# Patient Record
Sex: Female | Born: 1950 | Race: White | Hispanic: No | Smoking: Never smoker
Health system: Southern US, Community
[De-identification: ages and names within clinical notes are randomized; demographics above are authoritative.]

## PROBLEM LIST (undated history)

## (undated) DIAGNOSIS — Z8719 Personal history of other diseases of the digestive system: Secondary | ICD-10-CM

## (undated) DIAGNOSIS — T7840XA Allergy, unspecified, initial encounter: Secondary | ICD-10-CM

## (undated) DIAGNOSIS — F329 Major depressive disorder, single episode, unspecified: Secondary | ICD-10-CM

## (undated) DIAGNOSIS — F419 Anxiety disorder, unspecified: Secondary | ICD-10-CM

## (undated) DIAGNOSIS — F32A Depression, unspecified: Secondary | ICD-10-CM

## (undated) DIAGNOSIS — L900 Lichen sclerosus et atrophicus: Secondary | ICD-10-CM

## (undated) DIAGNOSIS — S83249A Other tear of medial meniscus, current injury, unspecified knee, initial encounter: Secondary | ICD-10-CM

## (undated) DIAGNOSIS — R42 Dizziness and giddiness: Secondary | ICD-10-CM

## (undated) DIAGNOSIS — M6530 Trigger finger, unspecified finger: Secondary | ICD-10-CM

## (undated) DIAGNOSIS — H269 Unspecified cataract: Secondary | ICD-10-CM

## (undated) DIAGNOSIS — M8580 Other specified disorders of bone density and structure, unspecified site: Secondary | ICD-10-CM

## (undated) DIAGNOSIS — S82899A Other fracture of unspecified lower leg, initial encounter for closed fracture: Secondary | ICD-10-CM

## (undated) DIAGNOSIS — E785 Hyperlipidemia, unspecified: Secondary | ICD-10-CM

## (undated) HISTORY — DX: Personal history of other diseases of the digestive system: Z87.19

## (undated) HISTORY — DX: Depression, unspecified: F32.A

## (undated) HISTORY — PX: EYE SURGERY: SHX253

## (undated) HISTORY — DX: Lichen sclerosus et atrophicus: L90.0

## (undated) HISTORY — DX: Other specified disorders of bone density and structure, unspecified site: M85.80

## (undated) HISTORY — PX: ABDOMINAL HYSTERECTOMY: SHX81

## (undated) HISTORY — DX: Other fracture of unspecified lower leg, initial encounter for closed fracture: S82.899A

## (undated) HISTORY — DX: Hyperlipidemia, unspecified: E78.5

## (undated) HISTORY — DX: Allergy, unspecified, initial encounter: T78.40XA

## (undated) HISTORY — DX: Other tear of medial meniscus, current injury, unspecified knee, initial encounter: S83.249A

## (undated) HISTORY — DX: Dizziness and giddiness: R42

## (undated) HISTORY — DX: Trigger finger, unspecified finger: M65.30

## (undated) HISTORY — DX: Unspecified cataract: H26.9

## (undated) HISTORY — DX: Major depressive disorder, single episode, unspecified: F32.9

## (undated) HISTORY — DX: Anxiety disorder, unspecified: F41.9

---
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
## 1978-07-12 HISTORY — PX: PARTIAL HYSTERECTOMY: SHX80

## 1998-02-14 ENCOUNTER — Encounter: Admission: RE | Admit: 1998-02-14 | Discharge: 1998-02-14 | Payer: Self-pay | Admitting: *Deleted

## 1999-02-19 ENCOUNTER — Encounter (INDEPENDENT_AMBULATORY_CARE_PROVIDER_SITE_OTHER): Payer: Self-pay | Admitting: Internal Medicine

## 1999-02-19 ENCOUNTER — Other Ambulatory Visit: Admission: RE | Admit: 1999-02-19 | Discharge: 1999-02-19 | Payer: Self-pay | Admitting: Obstetrics and Gynecology

## 1999-04-01 ENCOUNTER — Encounter (INDEPENDENT_AMBULATORY_CARE_PROVIDER_SITE_OTHER): Payer: Self-pay | Admitting: Specialist

## 1999-04-01 ENCOUNTER — Other Ambulatory Visit: Admission: RE | Admit: 1999-04-01 | Discharge: 1999-04-01 | Payer: Self-pay | Admitting: Radiology

## 1999-08-13 DIAGNOSIS — IMO0002 Reserved for concepts with insufficient information to code with codable children: Secondary | ICD-10-CM | POA: Insufficient documentation

## 1999-09-10 ENCOUNTER — Encounter (INDEPENDENT_AMBULATORY_CARE_PROVIDER_SITE_OTHER): Payer: Self-pay | Admitting: Internal Medicine

## 2000-02-19 ENCOUNTER — Other Ambulatory Visit: Admission: RE | Admit: 2000-02-19 | Discharge: 2000-02-19 | Payer: Self-pay | Admitting: Obstetrics and Gynecology

## 2000-05-11 ENCOUNTER — Ambulatory Visit (HOSPITAL_BASED_OUTPATIENT_CLINIC_OR_DEPARTMENT_OTHER): Admission: RE | Admit: 2000-05-11 | Discharge: 2000-05-11 | Payer: Self-pay | Admitting: *Deleted

## 2001-03-28 ENCOUNTER — Other Ambulatory Visit: Admission: RE | Admit: 2001-03-28 | Discharge: 2001-03-28 | Payer: Self-pay | Admitting: Internal Medicine

## 2002-06-11 ENCOUNTER — Encounter (INDEPENDENT_AMBULATORY_CARE_PROVIDER_SITE_OTHER): Payer: Self-pay | Admitting: Internal Medicine

## 2003-09-10 HISTORY — PX: COLONOSCOPY: SHX174

## 2004-06-30 ENCOUNTER — Ambulatory Visit: Payer: Self-pay | Admitting: Gastroenterology

## 2005-02-09 ENCOUNTER — Ambulatory Visit: Payer: Self-pay | Admitting: Family Medicine

## 2005-03-26 ENCOUNTER — Ambulatory Visit: Payer: Self-pay | Admitting: Family Medicine

## 2005-05-13 ENCOUNTER — Ambulatory Visit: Payer: Self-pay | Admitting: Family Medicine

## 2005-05-26 ENCOUNTER — Ambulatory Visit: Payer: Self-pay | Admitting: Family Medicine

## 2006-04-26 ENCOUNTER — Ambulatory Visit: Payer: Self-pay | Admitting: Internal Medicine

## 2006-09-26 ENCOUNTER — Ambulatory Visit: Payer: Self-pay | Admitting: Family Medicine

## 2006-09-28 ENCOUNTER — Encounter (INDEPENDENT_AMBULATORY_CARE_PROVIDER_SITE_OTHER): Payer: Self-pay | Admitting: Internal Medicine

## 2006-09-28 DIAGNOSIS — K649 Unspecified hemorrhoids: Secondary | ICD-10-CM | POA: Insufficient documentation

## 2006-09-28 DIAGNOSIS — M6530 Trigger finger, unspecified finger: Secondary | ICD-10-CM | POA: Insufficient documentation

## 2006-11-14 ENCOUNTER — Ambulatory Visit: Payer: Self-pay | Admitting: Family Medicine

## 2006-11-14 DIAGNOSIS — R071 Chest pain on breathing: Secondary | ICD-10-CM | POA: Insufficient documentation

## 2006-11-16 ENCOUNTER — Telehealth (INDEPENDENT_AMBULATORY_CARE_PROVIDER_SITE_OTHER): Payer: Self-pay | Admitting: *Deleted

## 2007-04-30 ENCOUNTER — Encounter: Payer: Self-pay | Admitting: Family Medicine

## 2008-01-17 ENCOUNTER — Ambulatory Visit: Payer: Self-pay | Admitting: Family Medicine

## 2008-01-17 DIAGNOSIS — M8580 Other specified disorders of bone density and structure, unspecified site: Secondary | ICD-10-CM | POA: Insufficient documentation

## 2008-01-17 DIAGNOSIS — F419 Anxiety disorder, unspecified: Secondary | ICD-10-CM | POA: Insufficient documentation

## 2008-01-17 DIAGNOSIS — M810 Age-related osteoporosis without current pathological fracture: Secondary | ICD-10-CM | POA: Insufficient documentation

## 2008-01-17 DIAGNOSIS — R0789 Other chest pain: Secondary | ICD-10-CM | POA: Insufficient documentation

## 2008-02-02 ENCOUNTER — Ambulatory Visit: Payer: Self-pay | Admitting: Cardiology

## 2008-02-02 ENCOUNTER — Ambulatory Visit: Payer: Self-pay

## 2008-02-06 ENCOUNTER — Encounter: Payer: Self-pay | Admitting: Family Medicine

## 2008-03-25 ENCOUNTER — Ambulatory Visit: Payer: Self-pay | Admitting: Family Medicine

## 2008-03-25 DIAGNOSIS — K5900 Constipation, unspecified: Secondary | ICD-10-CM | POA: Insufficient documentation

## 2008-03-25 DIAGNOSIS — K602 Anal fissure, unspecified: Secondary | ICD-10-CM | POA: Insufficient documentation

## 2008-04-25 ENCOUNTER — Ambulatory Visit: Payer: Self-pay | Admitting: Family Medicine

## 2008-05-03 LAB — CONVERTED CEMR LAB
Cholesterol: 206 mg/dL (ref 0–200)
Direct LDL: 130.9 mg/dL
HDL: 43.9 mg/dL (ref >39.0)
Triglycerides: 109 mg/dL (ref 0–149)

## 2008-07-15 ENCOUNTER — Encounter (INDEPENDENT_AMBULATORY_CARE_PROVIDER_SITE_OTHER): Payer: Self-pay | Admitting: Internal Medicine

## 2008-09-09 ENCOUNTER — Encounter: Admission: RE | Admit: 2008-09-09 | Discharge: 2008-09-09 | Payer: Self-pay | Admitting: Internal Medicine

## 2009-02-22 ENCOUNTER — Emergency Department: Payer: Self-pay | Admitting: Emergency Medicine

## 2010-08-09 LAB — CONVERTED CEMR LAB
Albumin: 3.8 g/dL (ref 3.5–5.2)
Alkaline Phosphatase: 66 units/L (ref 39–117)
BUN: 12 mg/dL (ref 6–23)
GFR calc Af Amer: 111 mL/min
GFR calc non Af Amer: 92 mL/min
HDL: 53.3 mg/dL (ref >39.0)
Pap Smear: NORMAL
Potassium: 4.6 meq/L (ref 3.5–5.1)
Sodium: 140 meq/L (ref 135–145)
VLDL: 14 mg/dL (ref 0–40)

## 2010-11-24 NOTE — Procedures (Signed)
[facility]                              EXERCISE TREADMILL   NAME:Begley, [facility]                        MRN:          [id number]  DATE:02/02/2008                            DOB:          March 08, 1951    Yolanda Walsh is a 60 year old female who has been scheduled for an exercise  treadmill due to atypical chest pain.  She states she feels like she  cannot take a deep breath at times with a little bit of pressure.  This typically occurs at night and when she is sitting still.  With  exertion, it improves.  Because of the above,  Dr. Ermalene Searing scheduled her  for an exercise treadmill.   She exercised for duration of 5 minutes and 39 seconds on the Bruce  protocol.  Her heart rate at rest was 71 and increased to 150 and that  was 92% of her predicted maximum.  Her blood pressure at rest was 132/88  and increased to 159/40.  She did not have increased chest pain with the  study.  There were no diagnostic electrocardiographic changes.  It was  terminated secondary to dyspnea and fatigue.   FINAL INTERPRETATION:  Exercise treadmill with no new chest pain and no  electrocardiographic changes.  She was therefore interpreted as a  negative adequate exercise tolerance test.     Madolyn Frieze. Jens Som, MD, [facility]  Electronically Signed    BSC/MedQ  DD: 02/02/2008  DT: 02/03/2008  Job #: [id number]   cc:   Kerby Nora, MD

## 2010-11-27 NOTE — Op Note (Signed)
[place]  Patient:    Yolanda Walsh, Yolanda Walsh                     MRN: [id number] Proc. Date: 05/11/00 Adm. Date:  [id number] Attending:  Kendell Bane                           Operative Report  PREOPERATIVE DIAGNOSIS:  Trigger finger, right middle finger.  POSTOPERATIVE DIAGNOSIS:  Trigger finger, right middle finger.  PROCEDURE:  Release of A1 pulley, right middle finger.  SURGEON:  Lowell Bouton, M.D.  ANESTHESIA:  Marcaine 0.5% local with sedation.  OPERATIVE FINDINGS:  The patient had significant flexor tenosynovitis.  There was no nodule on the flexor tendon.  DESCRIPTION OF PROCEDURE:  Under 0.5% Marcaine local anesthesia with a tourniquet on the right forearm, the right hand was prepped and draped in the usual fashion.  After exsanguinating the limb the tourniquet was inflated to 225 mmHg.  A transverse incision was made in the palm in line with the middle finger on the right.  Blunt dissection was carried through the subcutaneous tissue down to the flexor sheath.  The A1 pulley was incised sharply and was released completely with the scissors.  A tendon was then flexed and extended, and there was found to be significant tenosynovitis around the tendon.  There was no further triggering.  The wound was irrigated with saline. The skin was closed with 4-0 nylon suture and sterile dressings were applied.  The tourniquet was released, with good circulation to the hand.  The patient went to the recovery room awake, stable and in good condition. DD:  05/11/00 TD:  05/11/00 Job: [id number]

## 2011-07-13 DIAGNOSIS — Z8719 Personal history of other diseases of the digestive system: Secondary | ICD-10-CM

## 2011-07-13 HISTORY — DX: Personal history of other diseases of the digestive system: Z87.19

## 2012-03-23 ENCOUNTER — Encounter: Payer: Self-pay | Admitting: Family Medicine

## 2012-03-23 ENCOUNTER — Ambulatory Visit (INDEPENDENT_AMBULATORY_CARE_PROVIDER_SITE_OTHER): Payer: BC Managed Care – PPO | Admitting: Family Medicine

## 2012-03-23 ENCOUNTER — Ambulatory Visit (INDEPENDENT_AMBULATORY_CARE_PROVIDER_SITE_OTHER)
Admission: RE | Admit: 2012-03-23 | Discharge: 2012-03-23 | Disposition: A | Discharge status: Home / Self Care | Payer: BC Managed Care – PPO | Source: Ambulatory Visit | Attending: Family Medicine | Admitting: Family Medicine

## 2012-03-23 VITALS — BP 140/90 | HR 78 | Temp 98.4°F | Wt 188.0 lb

## 2012-03-23 DIAGNOSIS — R1030 Lower abdominal pain, unspecified: Secondary | ICD-10-CM | POA: Insufficient documentation

## 2012-03-23 DIAGNOSIS — K5900 Constipation, unspecified: Secondary | ICD-10-CM

## 2012-03-23 DIAGNOSIS — R109 Unspecified abdominal pain: Secondary | ICD-10-CM

## 2012-03-23 DIAGNOSIS — Z0000 Encounter for general adult medical examination without abnormal findings: Secondary | ICD-10-CM

## 2012-03-23 LAB — CBC WITH DIFFERENTIAL/PLATELET
Eosinophils Absolute: 0 10*3/uL (ref 0.0–0.7)
HCT: 37.7 % (ref 36.0–46.0)
Lymphs Abs: 1.4 10*3/uL (ref 0.7–4.0)
MCHC: 32.2 g/dL (ref 30.0–36.0)
MCV: 86.2 fl (ref 78.0–100.0)
Monocytes Absolute: 0.5 10*3/uL (ref 0.1–1.0)
Neutrophils Relative %: 74.7 % (ref 43.0–77.0)
Platelets: 203 10*3/uL (ref 150.0–400.0)
RDW: 13.4 % (ref 11.5–14.6)

## 2012-03-23 MED ORDER — CYCLOBENZAPRINE HCL 10 MG PO TABS: 10.0000 mg | ORAL_TABLET | Freq: Two times a day (BID) | ORAL | Status: AC | PRN

## 2012-03-23 MED ORDER — NAPROXEN 500 MG PO TABS: ORAL_TABLET | ORAL | Status: DC

## 2012-03-23 NOTE — Progress Notes (Signed)
Subjective:    Patient ID: Yolanda Walsh, female    DOB: October 23, 1950, 61 y.o.   MRN: [id number]  HPI CC: L groin pain  Prior saw Dr. Ermalene Searing, not seen since 2009.  Saw Tomi Bamberger for a few visits in between.  A few weeks ago lidted tree limbs at home.  Bilateral groin/leg pain started 3 weeks ago.  Significant pain bilateral legs especially when flexing hips, feels tightness in groin and suprapubic soreness.  Positional pain.  This week had some nausea.  Last few days also having lower back pain.    No fevers/chills, vomiting, diarrhea.  No shooting pain down legs, no numbness/weakness of legs.  No bowel/bladder accidents.  Has gained weight.  No urinary sxs. Wt Readings from Last 3 Encounters:  03/23/12 188 lb (85.276 kg)  03/25/08 182 lb (82.555 kg)  01/17/08 181 lb 8 oz (82.328 kg)   H/o anal fissure, this week had bleeding from fissure flare.  Suffering from some constipation currently.  Lots of family issues recently.  Mother of Yolanda Walsh.  Daughter also with heart problems, lives far away.  Husband passed from non hodgkin's disease.  bp elevated today, no prior h/o HTN.  Wonders if due to pain. BP Readings from Last 3 Encounters:  03/23/12 140/90  03/25/08 120/70  01/17/08 130/76    Preventative: Last CPE 2009.  Due for this. Colonoscopy ~2005 according to pt normal.  Told didn't need f/u for 10 yrs.  Medications and allergies reviewed and updated in chart.  Past histories reviewed and updated if relevant as below. Patient Active Problem List  Diagnosis  . HEMORRHOIDS NOS, W/O COMPLICATIONS  . CONSTIPATION  . RECTAL FISSURE  . TRIGGER FINGER  . OSTEOPENIA  . CHEST WALL PAIN, ANTERIOR  . CHEST PAIN, ATYPICAL  . MEDIAL MENISCUS TEAR  . DEPRESSION, HX OF  . Groin pain   Past Medical History  Diagnosis Date  . Lichen sclerosus     temovate cream  . Medial meniscus tear   . Trigger finger   . History of hemorrhoids     w/o complications  . History of  rectal fissure    Past Surgical History  Procedure Date  . Colonoscopy 09/2003  . Partial hysterectomy 1980    for prolapse, ovaries remain  . Left hand surgery 1962    tendon laceration   History  Substance Use Topics  . Smoking status: Never Smoker   . Smokeless tobacco: Never Used  . Alcohol Use: Yes     rare   Family History  Problem Relation Age of Onset  . Coronary artery disease Brother 40    7 stents  . Cancer Mother     leukemia  . Coronary artery disease Mother   . Coronary artery disease Father   . Parkinsonism Brother   . Hypertension Brother   . Hypertension Sister   . Crohn's disease Sister   . Diabetes Maternal Grandfather    No Known Allergies No current outpatient prescriptions on file prior to visit.     Review of Systems Per HPI    Objective:   Physical Exam  Nursing note and vitals reviewed. Constitutional: She is oriented to person, place, and time. She appears well-developed and well-nourished. No distress.  HENT:  Head: Normocephalic and atraumatic.  Right Ear: External ear normal.  Left Ear: External ear normal.  Nose: Nose normal.  Mouth/Throat: Oropharynx is clear and moist. No oropharyngeal exudate.  Eyes: Conjunctivae normal and EOM are normal.  Pupils are equal, round, and reactive to light. No scleral icterus.  Neck: Normal range of motion. Neck supple.  Cardiovascular: Normal rate, regular rhythm, normal heart sounds and intact distal pulses.   No murmur heard. Pulses:      Radial pulses are 2+ on the right side, and 2+ on the left side.  Pulmonary/Chest: Effort normal and breath sounds normal. No respiratory distress. She has no wheezes. She has no rales.  Abdominal: Soft. Normal appearance and bowel sounds are normal. She exhibits no distension and no mass. There is no hepatosplenomegaly. There is tenderness (moderate) in the left lower quadrant. There is no rigidity, no rebound, no guarding, no CVA tenderness and negative  Murphy's sign. No hernia (but tender to palpation at site where hernia would be). Hernia confirmed negative in the right inguinal area and confirmed negative in the left inguinal area.  Musculoskeletal: Normal range of motion. She exhibits no edema.       Tender to palpation inner groin. Also tender bilateral hips with int/ext rotation at hip. Pain with flexion of hip.  Not pain with abduction of hips. Neg SLR but tender at hip flexors with SLR bilaterally  Lymphadenopathy:    She has no cervical adenopathy.  Neurological: She is alert and oriented to person, place, and time.       CN grossly intact, station and gait intact  Skin: Skin is warm and dry. No rash noted.  Psychiatric: She has a normal mood and affect. Her behavior is normal. Judgment and thought content normal.       Assessment & Plan:

## 2012-03-23 NOTE — Patient Instructions (Addendum)
I think this is coming from your hips.  Xray today. Treat with anti inflammatory - naprosyn twice daily with food for 5 days then as needed.  Don't take with advil or other anti inflammatory. Also provided muscle relaxant - flexeril - but caution may make you sleepy so no driving with this. Let us know if not improving or any worsening, would consider CT scan if worsening. Fasting blood work today. If any worsening pain, fevers >101 please seek urgent care. Return at your convenience in the next few weeks for physical - we will recheck hips then. Good to see you, call us with quesitons.

## 2012-03-24 LAB — LIPID PANEL
Total CHOL/HDL Ratio: 5
VLDL: 15.2 mg/dL (ref 0.0–40.0)

## 2012-03-24 LAB — COMPREHENSIVE METABOLIC PANEL
ALT: 16 U/L (ref 0–35)
AST: 21 U/L (ref 0–37)
Albumin: 3.9 g/dL (ref 3.5–5.2)
BUN: 15 mg/dL (ref 6–23)
Calcium: 9.2 mg/dL (ref 8.4–10.5)
Chloride: 103 mEq/L (ref 96–112)
Potassium: 4.4 mEq/L (ref 3.5–5.1)
Sodium: 137 mEq/L (ref 135–145)
Total Protein: 7.5 g/dL (ref 6.0–8.3)

## 2012-03-24 LAB — LDL CHOLESTEROL, DIRECT: Direct LDL: 170 mg/dL

## 2012-03-26 NOTE — Assessment & Plan Note (Addendum)
Exam more consistent with MSK issue, likely hip pathology - check bilat hip xray today to eval for arthritis, other.  Overall normal.   ?abd pathology (divertic or ovarian) given also endorsed LLQ pain, but very positional pain in general.  Check blood work today.  H/o partial hysterectomy. I suspect bilat groin/hip flexor strains.  Treat as MSK strain with trial of NSAID and flexeril - update if worsening, would probably consider abd/pelvic CT scan.  Pt agrees with plan.

## 2012-03-26 NOTE — Assessment & Plan Note (Signed)
Discussed importance of control of constipation for symptomatic rectal fissure.

## 2012-04-07 ENCOUNTER — Telehealth: Payer: Self-pay | Admitting: Family Medicine

## 2012-04-07 NOTE — Telephone Encounter (Signed)
Noted. Thanks.

## 2012-04-07 NOTE — Telephone Encounter (Signed)
Caller: [name]/Patient; Patient Name: Yolanda Walsh; PCP: Eustaquio Boyden [facility]); Best Callback Phone Number: [phone]; Reason for call: Cough/Congestion. Onset of symptoms yesterday 04/06/12.  +Body aches, Temperature 100.6(0), no n/v/d. Cough non productive, no sneezing,no runny nose,  +headache. Treatment at home Equate Non drowsy Claritin. She is drinking green tea and eating toast. Voice clear and purposeful. Emergent s/sx ruled out per Upper Respiratory Infection Protocol with exception to " new onset of two or more of the following symptoms: runny nose, sneezing , itchy or mild sore throat, mild headache or body anches, fatigue , low grade fever up to 101.5 orally.  Home care advise given to patient. Understanding expressed. She will closely mointor her s/sx and call for questions, changes or concerns.

## 2012-04-11 HISTORY — PX: OTHER SURGICAL HISTORY: SHX169

## 2012-04-13 ENCOUNTER — Other Ambulatory Visit: Payer: Self-pay | Admitting: Family Medicine

## 2012-04-17 ENCOUNTER — Other Ambulatory Visit: Payer: BC Managed Care – PPO

## 2012-04-18 ENCOUNTER — Ambulatory Visit (INDEPENDENT_AMBULATORY_CARE_PROVIDER_SITE_OTHER): Payer: BC Managed Care – PPO

## 2012-04-18 DIAGNOSIS — Z23 Encounter for immunization: Secondary | ICD-10-CM

## 2012-04-24 ENCOUNTER — Encounter: Payer: Self-pay | Admitting: Family Medicine

## 2012-04-24 ENCOUNTER — Ambulatory Visit (INDEPENDENT_AMBULATORY_CARE_PROVIDER_SITE_OTHER): Payer: BC Managed Care – PPO | Admitting: Family Medicine

## 2012-04-24 VITALS — BP 132/78 | HR 72 | Temp 97.9°F | Ht 64.0 in | Wt 186.5 lb

## 2012-04-24 DIAGNOSIS — E785 Hyperlipidemia, unspecified: Secondary | ICD-10-CM | POA: Insufficient documentation

## 2012-04-24 DIAGNOSIS — Z780 Asymptomatic menopausal state: Secondary | ICD-10-CM

## 2012-04-24 DIAGNOSIS — Z0000 Encounter for general adult medical examination without abnormal findings: Secondary | ICD-10-CM | POA: Insufficient documentation

## 2012-04-24 DIAGNOSIS — Z1231 Encounter for screening mammogram for malignant neoplasm of breast: Secondary | ICD-10-CM

## 2012-04-24 DIAGNOSIS — M949 Disorder of cartilage, unspecified: Secondary | ICD-10-CM

## 2012-04-24 DIAGNOSIS — M899 Disorder of bone, unspecified: Secondary | ICD-10-CM

## 2012-04-24 HISTORY — DX: Hyperlipidemia, unspecified: E78.5

## 2012-04-24 NOTE — Assessment & Plan Note (Signed)
Reviewed #s with pt.  Recommended statin and ASA given fmhx. Start red yeast rice, rtc 6 mo for recheck levels.

## 2012-04-24 NOTE — Assessment & Plan Note (Signed)
Set up rpt dexa.

## 2012-04-24 NOTE — Patient Instructions (Addendum)
Call your insurace about the shingles shot to see if it is covered or how much it would cost and where is cheaper (here or pharmacy).  If you want to receive here, call for nurse visit. Start baby aspirin 81mg  daily (enteric coated). Start red yeast rice daily 600mg . Pass by Marion's office for referral to bone density and mammogram. Good to see you today, call us with questions.

## 2012-04-24 NOTE — Assessment & Plan Note (Signed)
Preventative protocols reviewed and updated unless pt declined. Discussed healthy diet and lifestyle. Set up with mammo and dexa.

## 2012-04-24 NOTE — Progress Notes (Signed)
Subjective:    Patient ID: Yolanda Walsh, female    DOB: 09/22/1950, 61 y.o.   MRN: [id number]  HPI CC: CPE  Groin pain remains but improved. Flexeril 10mg  was too strong.  Preventative:  Last CPE 2009. Due for this.  Colonoscopy ~2005 according to pt normal. Told didn't need f/u for 10 yrs.  H/o partal hysterectomy.  Well woman by Dr. Ambrose Mantle (due 05/2012).   Due for mammogram and bone density scan.  According to pt, last done 2 yrs ago and osteopenia. Flu - received this year. Tdap 2012. Shingles - interested.  Lives alone with cat and dog.  Widow, husband deceased from [place]. Occupation: retired, prior worked in Set [employer] Activity: no regular exercise, has eliptical Diet: good water, fruits/vegetables daily  Medications and allergies reviewed and updated in chart.  Past histories reviewed and updated if relevant as below. Patient Active Problem List  Diagnosis  . HEMORRHOIDS NOS, W/O COMPLICATIONS  . CONSTIPATION  . RECTAL FISSURE  . TRIGGER FINGER  . OSTEOPENIA  . CHEST WALL PAIN, ANTERIOR  . CHEST PAIN, ATYPICAL  . DEPRESSION, HX OF  . Groin pain   Past Medical History  Diagnosis Date  . Lichen sclerosus     temovate cream  . Medial meniscus tear   . Trigger finger   . History of hemorrhoids     w/o complications  . History of rectal fissure    Past Surgical History  Procedure Date  . Colonoscopy 09/2003  . Partial hysterectomy 1980    for prolapse, ovaries remain  . Left hand surgery 1962    tendon laceration   History  Substance Use Topics  . Smoking status: Never Smoker   . Smokeless tobacco: Never Used  . Alcohol Use: Yes     rare   Family History  Problem Relation Age of Onset  . Coronary artery disease Brother 40    7 stents  . Cancer Mother     leukemia  . Coronary artery disease Mother   . Coronary artery disease Father   . Parkinsonism Brother   . Hypertension Brother   . Hypertension Sister   . Crohn's disease Sister   .  Diabetes Maternal Grandfather    No Known Allergies Current Outpatient Prescriptions on File Prior to Visit  Medication Sig Dispense Refill  . docusate sodium (COLACE) 100 MG capsule Take 100 mg by mouth as needed.      Marland Kitchen ibuprofen (ADVIL,MOTRIN) 200 MG tablet Take 200 mg by mouth as needed.      . clobetasol cream (TEMOVATE) 0.05 % Apply topically 2 (two) times daily.      . naproxen (NAPROSYN) 500 MG tablet Take one po bid x 1 week then prn pain, take with food  60 tablet  0    Review of Systems  Constitutional: Negative for fever, chills, activity change, appetite change, fatigue and unexpected weight change.  HENT: Negative for hearing loss and neck pain.   Eyes: Negative for visual disturbance.  Respiratory: Positive for cough. Negative for chest tightness, shortness of breath and wheezing.   Cardiovascular: Negative for chest pain, palpitations and leg swelling.  Gastrointestinal: Negative for nausea, vomiting, abdominal pain, diarrhea, constipation, blood in stool and abdominal distention.  Genitourinary: Negative for hematuria and difficulty urinating.  Musculoskeletal: Negative for myalgias and arthralgias.  Skin: Negative for rash.  Neurological: Negative for dizziness, seizures, syncope and headaches.  Hematological: Does not bruise/bleed easily.  Psychiatric/Behavioral: Negative for dysphoric mood. The patient is not  nervous/anxious.        Objective:   Physical Exam  Nursing note and vitals reviewed. Constitutional: She is oriented to person, place, and time. She appears well-developed and well-nourished. No distress.  HENT:  Head: Normocephalic and atraumatic.  Right Ear: External ear normal.  Left Ear: External ear normal.  Nose: Nose normal.  Mouth/Throat: Oropharynx is clear and moist. No oropharyngeal exudate.  Eyes: Conjunctivae normal and EOM are normal. Pupils are equal, round, and reactive to light. No scleral icterus.  Neck: Normal range of motion. Neck  supple. Carotid bruit is not present.  Cardiovascular: Normal rate, regular rhythm, normal heart sounds and intact distal pulses.   No murmur heard. Pulses:      Radial pulses are 2+ on the right side, and 2+ on the left side.  Pulmonary/Chest: Effort normal and breath sounds normal. No respiratory distress. She has no wheezes. She has no rales.  Abdominal: Soft. Bowel sounds are normal. She exhibits no distension and no mass. There is no tenderness. There is no rebound and no guarding.  Musculoskeletal: Normal range of motion. She exhibits no edema.  Lymphadenopathy:    She has no cervical adenopathy.  Neurological: She is alert and oriented to person, place, and time.       CN grossly intact, station and gait intact  Skin: Skin is warm and dry. No rash noted.  Psychiatric: She has a normal mood and affect. Her behavior is normal. Judgment and thought content normal.      Assessment & Plan:

## 2012-04-25 ENCOUNTER — Encounter: Payer: Self-pay | Admitting: Family Medicine

## 2012-04-26 ENCOUNTER — Encounter: Payer: Self-pay | Admitting: *Deleted

## 2012-04-28 ENCOUNTER — Encounter: Payer: Self-pay | Admitting: Family Medicine

## 2012-05-01 ENCOUNTER — Encounter: Payer: Self-pay | Admitting: Family Medicine

## 2012-05-23 ENCOUNTER — Ambulatory Visit (INDEPENDENT_AMBULATORY_CARE_PROVIDER_SITE_OTHER): Payer: BC Managed Care – PPO | Admitting: *Deleted

## 2012-05-23 DIAGNOSIS — Z23 Encounter for immunization: Secondary | ICD-10-CM

## 2012-05-23 DIAGNOSIS — Z2911 Encounter for prophylactic immunotherapy for respiratory syncytial virus (RSV): Secondary | ICD-10-CM

## 2012-09-27 ENCOUNTER — Telehealth: Payer: Self-pay | Admitting: Family Medicine

## 2012-09-27 NOTE — Telephone Encounter (Signed)
Filled and placed in my out box. 

## 2012-09-27 NOTE — Telephone Encounter (Signed)
Janeece Agee dropped off a medical clearance form to be filled out.

## 2012-09-27 NOTE — Telephone Encounter (Signed)
Medical clearance form for pt to go [facility] with [facility]. Form is in Dr Timoteo Expose in box.Please advise.

## 2013-01-15 ENCOUNTER — Ambulatory Visit (INDEPENDENT_AMBULATORY_CARE_PROVIDER_SITE_OTHER): Payer: BC Managed Care – PPO | Admitting: Family Medicine

## 2013-01-15 ENCOUNTER — Encounter: Payer: Self-pay | Admitting: Family Medicine

## 2013-01-15 VITALS — BP 118/78 | HR 74 | Temp 98.5°F | Wt 188.5 lb

## 2013-01-15 DIAGNOSIS — R05 Cough: Secondary | ICD-10-CM

## 2013-01-15 MED ORDER — FLUTICASONE PROPIONATE 50 MCG/ACT NA SUSP: 2.0000 | Freq: Every day | NASAL | Status: DC

## 2013-01-15 NOTE — Patient Instructions (Signed)
Let's try course of nasal steroid - as I think this cough is due to allergies. If after 2-3 weeks no noted improvement, let me know. Good to see you today, call us with questions.

## 2013-01-15 NOTE — Progress Notes (Signed)
  Subjective:    Patient ID: Yolanda Walsh, female    DOB: Jan 26, 1951, 62 y.o.   MRN: [id number]  HPI CC: chronic cough  Chronic cough ongoing for last 4-6 weeks.  Has tried chlortrimeton, benadryl, loratadine, and 12 hour pseudophed.  Nothing has really helped.  If doesn't take meds, cough keeping her up at night.  Feels cough in mid chest.  Occasionally productive of phlegm.  Constant PNdrainage.  Some mild dyspnea.  No wheezing.  Cough worse at night time.  Normally doesn't take anything for allergies.  Denies fevers/chills, nasal congestion.  No watery eyes.  No headaches.  No reflux/heartburn sxs. More sensitive to strong odors (sister's hair products). Has indoor cat for the last year. No new lotions, detergents, soaps, shampoos.  All unscented products.  Not on ACEI. No h/o asthma.  Was on RYR but caused some muscle stiffness, so stopped.  Past Medical History  Diagnosis Date  . Lichen sclerosus     temovate cream  . Medial meniscus tear   . Trigger finger   . History of hemorrhoids     w/o complications  . History of rectal fissure   . Osteopenia     dexa 2013     Review of Systems Per HPI    Objective:   Physical Exam  Nursing note and vitals reviewed. Constitutional: She appears well-developed and well-nourished. No distress.  HENT:  Head: Normocephalic and atraumatic.  Right Ear: Hearing, tympanic membrane, external ear and ear canal normal.  Left Ear: Hearing, tympanic membrane, external ear and ear canal normal.  Nose: Mucosal edema (pale turbinates) present. No rhinorrhea. Right sinus exhibits no maxillary sinus tenderness and no frontal sinus tenderness. Left sinus exhibits no maxillary sinus tenderness and no frontal sinus tenderness.  Mouth/Throat: Uvula is midline, oropharynx is clear and moist and mucous membranes are normal. No oropharyngeal exudate, posterior oropharyngeal edema, posterior oropharyngeal erythema or tonsillar abscesses.  Some posterior  oropharyngeal cobblestoning  Eyes: Conjunctivae and EOM are normal. Pupils are equal, round, and reactive to light. No scleral icterus.  Neck: Normal range of motion. Neck supple.  Cardiovascular: Normal rate, regular rhythm, normal heart sounds and intact distal pulses.   No murmur heard. Pulmonary/Chest: Effort normal and breath sounds normal. No respiratory distress. She has no wheezes. She has no rales.  Lymphadenopathy:    She has no cervical adenopathy.  Skin: Skin is warm and dry. No rash noted.       Assessment & Plan:

## 2013-01-15 NOTE — Assessment & Plan Note (Signed)
Anticipate due to post nasal drip Treat with nasal saline, nasal steroid, and continued oral antihistamine. Update if sxs persist. Consider treatment for occult GERD.

## 2013-01-25 ENCOUNTER — Telehealth: Payer: Self-pay

## 2013-01-25 DIAGNOSIS — R05 Cough: Secondary | ICD-10-CM

## 2013-01-25 DIAGNOSIS — H9319 Tinnitus, unspecified ear: Secondary | ICD-10-CM

## 2013-01-25 NOTE — Telephone Encounter (Signed)
Pt seen 01/15/13 and since then tinnitus is worse, constant loud roar all the time and difficulty hearing out of ear. Pt request referral and cb.

## 2013-01-29 DIAGNOSIS — H9319 Tinnitus, unspecified ear: Secondary | ICD-10-CM | POA: Insufficient documentation

## 2013-01-29 NOTE — Telephone Encounter (Signed)
Noted. Notified via MyChart that referral was placed. plz fax latest note to ENT.

## 2013-01-30 NOTE — Telephone Encounter (Signed)
Pt is scheduled for Dr. Linus Salmons 02/13/13 9:30, records faxed, pt informed.  / lt

## 2013-03-01 ENCOUNTER — Ambulatory Visit: Payer: Self-pay | Admitting: Unknown Physician Specialty

## 2013-04-19 ENCOUNTER — Ambulatory Visit (INDEPENDENT_AMBULATORY_CARE_PROVIDER_SITE_OTHER): Payer: BC Managed Care – PPO

## 2013-04-19 DIAGNOSIS — Z23 Encounter for immunization: Secondary | ICD-10-CM

## 2013-11-12 ENCOUNTER — Ambulatory Visit (INDEPENDENT_AMBULATORY_CARE_PROVIDER_SITE_OTHER): Payer: BC Managed Care – PPO | Admitting: Family Medicine

## 2013-11-12 ENCOUNTER — Encounter: Payer: Self-pay | Admitting: Family Medicine

## 2013-11-12 VITALS — BP 134/78 | HR 80 | Temp 98.1°F | Wt 184.2 lb

## 2013-11-12 DIAGNOSIS — M545 Low back pain, unspecified: Secondary | ICD-10-CM

## 2013-11-12 MED ORDER — METHOCARBAMOL 500 MG PO TABS: 500.0000 mg | ORAL_TABLET | Freq: Three times a day (TID) | ORAL | Status: DC | PRN

## 2013-11-12 NOTE — Progress Notes (Signed)
BP 134/78  Pulse 80  Temp(Src) 98.1 F (36.7 C) (Oral)  Wt 184 lb 4 oz (83.575 kg)   CC: left sided pain  Subjective:    Patient ID: Yolanda Walsh, female    DOB: 09/29/1950, 63 y.o.   MRN: [id number]  HPI: Yolanda Walsh is a 63 y.o. female presenting on 11/12/2013 for Back Pain   1wk h/o left sided pain described as dull nagging ache.  Describes catch in left back, feels bloated some as well as mild nausea.  Denies LBP.  Denies shooting pain down leg.  No numbness or tingling of leg.  Not too positional. Treated with epsom salt, heating pads, advil.  Tried left over flexeril which caused sedation but didn't help.  Voiding well (no dysuria, hematuria, urgency), stooling normally (no diarrhea, constipation, blood in stool).  No fevers/chills.  Has been doing "daily burn" program for last 3 weeks - squats, lunges for 28 minutes, warms up with stretching.  2nd week entailed side planks. Planned trip to [place] in 2 weeks.  Wt Readings from Last 3 Encounters:  11/12/13 184 lb 4 oz (83.575 kg)  01/15/13 188 lb 8 oz (85.503 kg)  04/24/12 186 lb 8 oz (84.596 kg)    Relevant past medical, surgical, family and social history reviewed and updated as indicated.  Allergies and medications reviewed and updated. Current Outpatient Prescriptions on File Prior to Visit  Medication Sig  . aspirin EC 81 MG tablet Take 81 mg by mouth daily.  Marland Kitchen docusate sodium (COLACE) 100 MG capsule Take 100 mg by mouth as needed.  Marland Kitchen ibuprofen (ADVIL,MOTRIN) 200 MG tablet Take 200 mg by mouth as needed.  . chlorpheniramine (CHLOR-TRIMETON) 4 MG tablet Take 4 mg by mouth 2 (two) times daily as needed for allergies.  . clobetasol cream (TEMOVATE) 0.05 % Apply topically 2 (two) times daily as needed.   . fluticasone (FLONASE) 50 MCG/ACT nasal spray Place 2 sprays into the nose daily.  Marland Kitchen loratadine (CLARITIN) 10 MG tablet Take 10 mg by mouth daily as needed for allergies.  . Red Yeast Rice 600 MG CAPS Take 1 capsule  by mouth daily.   No current facility-administered medications on file prior to visit.    Review of Systems Per HPI unless specifically indicated above    Objective:    BP 134/78  Pulse 80  Temp(Src) 98.1 F (36.7 C) (Oral)  Wt 184 lb 4 oz (83.575 kg)  Physical Exam  Nursing note and vitals reviewed. Constitutional: She appears well-developed and well-nourished. No distress.  Abdominal: Soft. Normal appearance and bowel sounds are normal. She exhibits no distension and no mass. There is no hepatosplenomegaly. There is tenderness (mild to deep palpation) in the right lower quadrant. There is no rigidity, no rebound, no guarding, no CVA tenderness and negative Murphy's sign.  Musculoskeletal: She exhibits no edema.  Mild discomfort to palpation midline spine lower lumbar area No paraspinous mm tenderness Neg SLR bilaterally. No pain with int/ext rotation at hip. No pain at GTB or sciatic notch bilaterally. Tender at R SIJ       Assessment & Plan:   Problem List Items Addressed This Visit   Right low back pain - Primary     Lower back pain that is reproducible to palpation. Anticipate msk cause - lumbar strain vs possible sacroiliitis - after recent increased exertion. Treat with advil, robaxin, ice/heat and stretches from SM pt advisor. Doubt urinary tract etiology as no LUTS sxs.  Doubt ovarian pathology.  If persistent, consider these ddx.    Relevant Medications      methocarbamol (ROBAXIN) tablet       Follow up plan: Return if symptoms worsen or fail to improve.

## 2013-11-12 NOTE — Progress Notes (Signed)
Pre visit review using our clinic review tool, if applicable. No additional management support is needed unless otherwise documented below in the visit note. 

## 2013-11-12 NOTE — Assessment & Plan Note (Addendum)
Lower back pain that is reproducible to palpation. Anticipate msk cause - lumbar strain vs possible sacroiliitis - after recent increased exertion. Treat with advil, robaxin, ice/heat and stretches from SM pt advisor. Doubt urinary tract etiology as no LUTS sxs.  Doubt ovarian pathology. If persistent, consider these ddx.

## 2013-11-12 NOTE — Patient Instructions (Addendum)
I think you had a strain of the back or possible inflammation of one of your back joints. Treat with robaxin muscle relaxant and continued advil as up to now. Stretching exercises provided today as well. Continue ice/heat whichever soothes better. Let me know if not improving with above for further evaluation.

## 2013-12-07 ENCOUNTER — Encounter: Payer: Self-pay | Admitting: Family Medicine

## 2014-03-15 ENCOUNTER — Encounter (HOSPITAL_COMMUNITY): Payer: Self-pay | Admitting: Emergency Medicine

## 2014-03-15 DIAGNOSIS — M899 Disorder of bone, unspecified: Secondary | ICD-10-CM | POA: Diagnosis not present

## 2014-03-15 DIAGNOSIS — E785 Hyperlipidemia, unspecified: Secondary | ICD-10-CM | POA: Diagnosis not present

## 2014-03-15 DIAGNOSIS — Y9389 Activity, other specified: Secondary | ICD-10-CM | POA: Diagnosis not present

## 2014-03-15 DIAGNOSIS — L0889 Other specified local infections of the skin and subcutaneous tissue: Secondary | ICD-10-CM | POA: Insufficient documentation

## 2014-03-15 DIAGNOSIS — IMO0001 Reserved for inherently not codable concepts without codable children: Secondary | ICD-10-CM | POA: Insufficient documentation

## 2014-03-15 DIAGNOSIS — Y92009 Unspecified place in unspecified non-institutional (private) residence as the place of occurrence of the external cause: Secondary | ICD-10-CM | POA: Diagnosis not present

## 2014-03-15 DIAGNOSIS — Y998 Other external cause status: Secondary | ICD-10-CM | POA: Diagnosis not present

## 2014-03-15 DIAGNOSIS — M949 Disorder of cartilage, unspecified: Secondary | ICD-10-CM

## 2014-03-15 LAB — CBC WITH DIFFERENTIAL/PLATELET
Basophils Absolute: 0 10*3/uL (ref 0.0–0.1)
Basophils Relative: 0 % (ref 0–1)
EOS ABS: 0.1 10*3/uL (ref 0.0–0.7)
Eosinophils Relative: 1 % (ref 0–5)
HEMATOCRIT: 36.5 % (ref 36.0–46.0)
HEMOGLOBIN: 12 g/dL (ref 12.0–15.0)
LYMPHS ABS: 2 10*3/uL (ref 0.7–4.0)
Lymphocytes Relative: 22 % (ref 12–46)
MCH: 26.8 pg (ref 26.0–34.0)
MCHC: 32.9 g/dL (ref 30.0–36.0)
MCV: 81.7 fL (ref 78.0–100.0)
MONO ABS: 0.6 10*3/uL (ref 0.1–1.0)
MONOS PCT: 6 % (ref 3–12)
NEUTROS PCT: 71 % (ref 43–77)
Neutro Abs: 6.6 10*3/uL (ref 1.7–7.7)
Platelets: 235 10*3/uL (ref 150–400)
RBC: 4.47 MIL/uL (ref 3.87–5.11)
RDW: 13.9 % (ref 11.5–15.5)
WBC: 9.2 10*3/uL (ref 4.0–10.5)

## 2014-03-15 LAB — COMPREHENSIVE METABOLIC PANEL
ALK PHOS: 80 U/L (ref 39–117)
ALT: 16 U/L (ref 0–35)
ANION GAP: 12 (ref 5–15)
AST: 22 U/L (ref 0–37)
Albumin: 3.7 g/dL (ref 3.5–5.2)
BILIRUBIN TOTAL: 0.2 mg/dL — AB (ref 0.3–1.2)
BUN: 18 mg/dL (ref 6–23)
CHLORIDE: 105 meq/L (ref 96–112)
CO2: 24 mEq/L (ref 19–32)
Calcium: 9.5 mg/dL (ref 8.4–10.5)
Creatinine, Ser: 0.92 mg/dL (ref 0.50–1.10)
GFR calc non Af Amer: 65 mL/min — ABNORMAL LOW (ref >90)
GFR, EST AFRICAN AMERICAN: 75 mL/min — AB (ref >90)
GLUCOSE: 115 mg/dL — AB (ref 70–99)
POTASSIUM: 4.1 meq/L (ref 3.7–5.3)
Sodium: 141 mEq/L (ref 137–147)
TOTAL PROTEIN: 7.8 g/dL (ref 6.0–8.3)

## 2014-03-15 NOTE — ED Notes (Signed)
The pt is c/o rt hand since her cat bit her 3 days ago.  She has redness and swelling to the dorsal hand around the wrist area

## 2014-03-16 ENCOUNTER — Ambulatory Visit (HOSPITAL_COMMUNITY)
Admission: EM | Admit: 2014-03-16 | Discharge: 2014-03-16 | Disposition: A | Discharge status: Home / Self Care | Payer: BC Managed Care – PPO | Attending: Emergency Medicine | Admitting: Emergency Medicine

## 2014-03-16 ENCOUNTER — Emergency Department (HOSPITAL_COMMUNITY): Payer: BC Managed Care – PPO | Admitting: Anesthesiology

## 2014-03-16 ENCOUNTER — Ambulatory Visit: Payer: BC Managed Care – PPO | Admitting: Family Medicine

## 2014-03-16 ENCOUNTER — Emergency Department (HOSPITAL_COMMUNITY): Payer: BC Managed Care – PPO

## 2014-03-16 ENCOUNTER — Encounter: Payer: Self-pay | Admitting: Family Medicine

## 2014-03-16 ENCOUNTER — Encounter (HOSPITAL_COMMUNITY)
Admission: EM | Disposition: A | Discharge status: Home / Self Care | Payer: Self-pay | Source: Home / Self Care | Attending: Emergency Medicine

## 2014-03-16 ENCOUNTER — Encounter (HOSPITAL_COMMUNITY): Payer: BC Managed Care – PPO | Admitting: Anesthesiology

## 2014-03-16 DIAGNOSIS — W5501XA Bitten by cat, initial encounter: Secondary | ICD-10-CM

## 2014-03-16 DIAGNOSIS — S61451A Open bite of right hand, initial encounter: Secondary | ICD-10-CM

## 2014-03-16 HISTORY — PX: I & D EXTREMITY: SHX5045

## 2014-03-16 SURGERY — IRRIGATION AND DEBRIDEMENT EXTREMITY
Anesthesia: General | Site: Hand | Laterality: Right

## 2014-03-16 MED ORDER — ARTIFICIAL TEARS OP OINT
TOPICAL_OINTMENT | OPHTHALMIC | Status: DC | PRN
  Administered 2014-03-16: 1 via OPHTHALMIC

## 2014-03-16 MED ORDER — SODIUM CHLORIDE 0.9 % IV SOLN
3.0000 g | Freq: Once | INTRAVENOUS | Status: AC
  Administered 2014-03-16: 3 g via INTRAVENOUS
  Filled 2014-03-16: qty 3

## 2014-03-16 MED ORDER — BUPIVACAINE HCL (PF) 0.25 % IJ SOLN
INTRAMUSCULAR | Status: AC
  Filled 2014-03-16: qty 30

## 2014-03-16 MED ORDER — PROPOFOL 10 MG/ML IV BOLUS
INTRAVENOUS | Status: AC
  Filled 2014-03-16: qty 20

## 2014-03-16 MED ORDER — PROPOFOL 10 MG/ML IV BOLUS
INTRAVENOUS | Status: DC | PRN
  Administered 2014-03-16: 150 mg via INTRAVENOUS

## 2014-03-16 MED ORDER — AMOXICILLIN-POT CLAVULANATE 875-125 MG PO TABS: 1.0000 | ORAL_TABLET | Freq: Two times a day (BID) | ORAL | Status: DC

## 2014-03-16 MED ORDER — 0.9 % SODIUM CHLORIDE (POUR BTL) OPTIME
TOPICAL | Status: DC | PRN
  Administered 2014-03-16: 1000 mL

## 2014-03-16 MED ORDER — ONDANSETRON HCL 4 MG/2ML IJ SOLN
INTRAMUSCULAR | Status: DC | PRN
  Administered 2014-03-16: 4 mg via INTRAVENOUS

## 2014-03-16 MED ORDER — LIDOCAINE HCL (CARDIAC) 20 MG/ML IV SOLN
INTRAVENOUS | Status: DC | PRN
  Administered 2014-03-16: 70 mg via INTRAVENOUS

## 2014-03-16 MED ORDER — HYDROMORPHONE HCL PF 1 MG/ML IJ SOLN: 0.2500 mg | INTRAMUSCULAR | Status: DC | PRN

## 2014-03-16 MED ORDER — FENTANYL CITRATE 0.05 MG/ML IJ SOLN
INTRAMUSCULAR | Status: AC
  Filled 2014-03-16: qty 5

## 2014-03-16 MED ORDER — LACTATED RINGERS IV SOLN
INTRAVENOUS | Status: DC | PRN
  Administered 2014-03-16: 04:00:00 via INTRAVENOUS

## 2014-03-16 MED ORDER — OXYCODONE-ACETAMINOPHEN 5-325 MG PO TABS: ORAL_TABLET | ORAL | Status: DC

## 2014-03-16 MED ORDER — OXYCODONE HCL 5 MG/5ML PO SOLN: 5.0000 mg | Freq: Once | ORAL | Status: DC | PRN

## 2014-03-16 MED ORDER — FENTANYL CITRATE 0.05 MG/ML IJ SOLN
INTRAMUSCULAR | Status: DC | PRN
  Administered 2014-03-16: 25 ug via INTRAVENOUS
  Administered 2014-03-16: 50 ug via INTRAVENOUS
  Administered 2014-03-16: 25 ug via INTRAVENOUS
  Administered 2014-03-16: 50 ug via INTRAVENOUS

## 2014-03-16 MED ORDER — OXYCODONE HCL 5 MG PO TABS: 5.0000 mg | ORAL_TABLET | Freq: Once | ORAL | Status: DC | PRN

## 2014-03-16 MED ORDER — BUPIVACAINE HCL (PF) 0.25 % IJ SOLN
INTRAMUSCULAR | Status: DC | PRN
  Administered 2014-03-16: 30 mL

## 2014-03-16 MED ORDER — SODIUM CHLORIDE 0.9 % IR SOLN
Status: DC | PRN
  Administered 2014-03-16: 1000 mL

## 2014-03-16 SURGICAL SUPPLY — 58 items
BANDAGE COBAN STERILE 2 (GAUZE/BANDAGES/DRESSINGS) IMPLANT
BANDAGE ELASTIC 3 VELCRO ST LF (GAUZE/BANDAGES/DRESSINGS) ×3 IMPLANT
BANDAGE ELASTIC 4 VELCRO ST LF (GAUZE/BANDAGES/DRESSINGS) ×3 IMPLANT
BNDG CMPR 9X4 STRL LF SNTH (GAUZE/BANDAGES/DRESSINGS) ×1
BNDG COHESIVE 1X5 TAN STRL LF (GAUZE/BANDAGES/DRESSINGS) IMPLANT
BNDG CONFORM 2 STRL LF (GAUZE/BANDAGES/DRESSINGS) IMPLANT
BNDG ESMARK 4X9 LF (GAUZE/BANDAGES/DRESSINGS) ×2 IMPLANT
BNDG GAUZE ELAST 4 BULKY (GAUZE/BANDAGES/DRESSINGS) ×3 IMPLANT
CORDS BIPOLAR (ELECTRODE) ×3 IMPLANT
COVER SURGICAL LIGHT HANDLE (MISCELLANEOUS) ×3 IMPLANT
CUFF TOURNIQUET SINGLE 18IN (TOURNIQUET CUFF) ×2 IMPLANT
DECANTER SPIKE VIAL GLASS SM (MISCELLANEOUS) ×3 IMPLANT
DRAIN PENROSE 1/4X12 LTX STRL (WOUND CARE) IMPLANT
DRSG ADAPTIC 3X8 NADH LF (GAUZE/BANDAGES/DRESSINGS) IMPLANT
DRSG EMULSION OIL 3X3 NADH (GAUZE/BANDAGES/DRESSINGS) ×3 IMPLANT
DRSG PAD ABDOMINAL 8X10 ST (GAUZE/BANDAGES/DRESSINGS) ×6 IMPLANT
GAUZE IODOFORM PACK 1/2 7832 (GAUZE/BANDAGES/DRESSINGS) ×2 IMPLANT
GAUZE SPONGE 4X4 12PLY STRL (GAUZE/BANDAGES/DRESSINGS) ×3 IMPLANT
GAUZE XEROFORM 1X8 LF (GAUZE/BANDAGES/DRESSINGS) ×1 IMPLANT
GLOVE BIO SURGEON STRL SZ7 (GLOVE) ×2 IMPLANT
GLOVE BIO SURGEON STRL SZ7.5 (GLOVE) ×5 IMPLANT
GLOVE BIOGEL PI IND STRL 7.0 (GLOVE) IMPLANT
GLOVE BIOGEL PI IND STRL 8 (GLOVE) ×1 IMPLANT
GLOVE BIOGEL PI INDICATOR 7.0 (GLOVE) ×2
GLOVE BIOGEL PI INDICATOR 8 (GLOVE) ×2
GOWN STRL REIN XL XLG (GOWN DISPOSABLE) ×3 IMPLANT
HANDPIECE INTERPULSE COAX TIP (DISPOSABLE)
KIT BASIN OR (CUSTOM PROCEDURE TRAY) ×5 IMPLANT
KIT ROOM TURNOVER OR (KITS) ×3 IMPLANT
LOOP VESSEL MAXI BLUE (MISCELLANEOUS) IMPLANT
LOOP VESSEL MINI RED (MISCELLANEOUS) IMPLANT
MANIFOLD NEPTUNE II (INSTRUMENTS) ×3 IMPLANT
NDL 25GX 5/8IN NON SAFETY (NEEDLE) IMPLANT
NDL HYPO 25X1 1.5 SAFETY (NEEDLE) IMPLANT
NEEDLE 25GX 5/8IN NON SAFETY (NEEDLE) ×3 IMPLANT
NEEDLE HYPO 25X1 1.5 SAFETY (NEEDLE) IMPLANT
NS IRRIG 1000ML POUR BTL (IV SOLUTION) ×3 IMPLANT
PACK ORTHO EXTREMITY (CUSTOM PROCEDURE TRAY) ×3 IMPLANT
PAD ARMBOARD 7.5X6 YLW CONV (MISCELLANEOUS) ×6 IMPLANT
SCRUB BETADINE 4OZ XXX (MISCELLANEOUS) ×3 IMPLANT
SET HNDPC FAN SPRY TIP SCT (DISPOSABLE) IMPLANT
SOLUTION BETADINE 4OZ (MISCELLANEOUS) ×3 IMPLANT
SPONGE LAP 18X18 X RAY DECT (DISPOSABLE) ×3 IMPLANT
SPONGE LAP 4X18 X RAY DECT (DISPOSABLE) ×3 IMPLANT
SUCTION FRAZIER TIP 10 FR DISP (SUCTIONS) ×3 IMPLANT
SUT ETHILON 4 0 PS 2 18 (SUTURE) ×3 IMPLANT
SUT MON AB 5-0 P3 18 (SUTURE) IMPLANT
SWAB COLLECTION DEVICE MRSA (MISCELLANEOUS) ×2 IMPLANT
SYR CONTROL 10ML LL (SYRINGE) ×2 IMPLANT
TOWEL OR 17X24 6PK STRL BLUE (TOWEL DISPOSABLE) ×3 IMPLANT
TOWEL OR 17X26 10 PK STRL BLUE (TOWEL DISPOSABLE) ×3 IMPLANT
TUBE ANAEROBIC SPECIMEN COL (MISCELLANEOUS) ×2 IMPLANT
TUBE CONNECTING 12'X1/4 (SUCTIONS) ×1
TUBE CONNECTING 12X1/4 (SUCTIONS) ×2 IMPLANT
TUBE FEEDING 5FR 15 INCH (TUBING) IMPLANT
UNDERPAD 30X30 INCONTINENT (UNDERPADS AND DIAPERS) ×3 IMPLANT
WATER STERILE IRR 1000ML POUR (IV SOLUTION) ×3 IMPLANT
YANKAUER SUCT BULB TIP NO VENT (SUCTIONS) ×3 IMPLANT

## 2014-03-16 NOTE — ED Notes (Signed)
Pt valuable belongings were given to security and are in the safe. Valuable envelope number [id number].

## 2014-03-16 NOTE — Brief Op Note (Signed)
03/16/2014  4:43 AM  PATIENT:  Yolanda Walsh  63 y.o. female  PRE-OPERATIVE DIAGNOSIS:  infected right hand cat bite  POST-OPERATIVE DIAGNOSIS:  infected right hand cat bite  PROCEDURE:  Procedure(s): IRRIGATION AND DEBRIDEMENT EXTREMITY (Right)  SURGEON:  Surgeon(s) and Role:    * Leanora Cover, MD - Primary  PHYSICIAN ASSISTANT:   ASSISTANTS: none   ANESTHESIA:   general  EBL:     BLOOD ADMINISTERED:none  DRAINS: iodoform packing  LOCAL MEDICATIONS USED:  MARCAINE     SPECIMEN:  Source of Specimen:  right hand  DISPOSITION OF SPECIMEN:  micro  COUNTS:  YES  TOURNIQUET:   Total Tourniquet Time Documented: Upper Arm (Right) - 13 minutes Total: Upper Arm (Right) - 13 minutes   DICTATION: .Other Dictation: Dictation Number [id number]  PLAN OF CARE: Discharge to home after PACU  PATIENT DISPOSITION:  PACU - hemodynamically stable.

## 2014-03-16 NOTE — Anesthesia Procedure Notes (Signed)
Procedure Name: LMA Insertion Date/Time: 03/16/2014 4:16 AM Performed by: Vaughan Browner Pre-anesthesia Checklist: Patient identified, Emergency Drugs available, Suction available and Patient being monitored Patient Re-evaluated:Patient Re-evaluated prior to inductionOxygen Delivery Method: Circle system utilized Preoxygenation: Pre-oxygenation with 100% oxygen Intubation Type: IV induction LMA: LMA inserted LMA Size: 4.0 Number of attempts: 1 Placement Confirmation: positive ETCO2 and breath sounds checked- equal and bilateral Tube secured with: Tape Dental Injury: Teeth and Oropharynx as per pre-operative assessment

## 2014-03-16 NOTE — ED Provider Notes (Signed)
CSN: [id number]     Arrival date & time 03/15/14  2140 History   First MD Initiated Contact with Patient 03/16/14 0112     Chief Complaint  Patient presents with  . Hand Problem     (Consider location/radiation/quality/duration/timing/severity/associated sxs/prior Treatment) HPI Patient presents with a right hand swelling, redness and pain after she was bitten by her cat 3 days ago. Cat is up-to-date on immunizations. Patient states that swelling, redness and pain started today. She's had no nausea, vomiting, fever or chills. She's had decreased range of motion of the right hand due to swelling and pain. Her last tetanus is within the last 5 years. Past Medical History  Diagnosis Date  . Lichen sclerosus     temovate cream  . Medial meniscus tear   . Trigger finger   . History of hemorrhoids     w/o complications  . History of rectal fissure   . Osteopenia     dexa 2013  . Hyperlipidemia 04/24/2012   Past Surgical History  Procedure Laterality Date  . Colonoscopy  09/2003  . Partial hysterectomy  1980    for prolapse, ovaries remain  . Left hand surgery  1962    tendon laceration  . Dexa  04/2012    T -2.1 hip, -1.2 spine   Family History  Problem Relation Age of Onset  . Coronary artery disease Brother 40    7 stents  . Cancer Mother     leukemia  . Coronary artery disease Mother 77  . Coronary artery disease Father 49    MI  . Parkinsonism Brother   . Hypertension Brother   . Hypertension Sister   . Crohn's disease Sister   . Diabetes Maternal Grandfather    History  Substance Use Topics  . Smoking status: Never Smoker   . Smokeless tobacco: Never Used  . Alcohol Use: Yes     Comment: rare   OB History   Grav Para Term Preterm Abortions TAB SAB Ect Mult Living                 Review of Systems  Constitutional: Negative for fever and chills.  Gastrointestinal: Negative for nausea and vomiting.  Musculoskeletal: Positive for joint swelling.  Skin:  Positive for color change.  Neurological: Negative for weakness and numbness.  All other systems reviewed and are negative.     Allergies  Review of patient's allergies indicates no known allergies.  Home Medications   Prior to Admission medications   Medication Sig Start Date End Date Taking? Authorizing Provider  aspirin EC 81 MG tablet Take 81 mg by mouth daily.   Yes Historical Provider, MD  clobetasol cream (TEMOVATE) 4.27 % Apply 1 application topically 2 (two) times daily as needed. For rash on back   Yes Historical Provider, MD  docusate sodium (COLACE) 100 MG capsule Take 100 mg by mouth as needed.   Yes Historical Provider, MD  loratadine (CLARITIN) 10 MG tablet Take 10 mg by mouth daily as needed for allergies.   Yes Historical Provider, MD  Red Yeast Rice 600 MG CAPS Take 1 capsule by mouth daily.   Yes Historical Provider, MD   BP 146/91  Pulse 92  Temp(Src) 98.4 F (36.9 C)  Resp 18  Ht 5\' 4"  (1.626 m)  Wt 186 lb (84.369 kg)  BMI 31.91 kg/m2  SpO2 96% Physical Exam  Nursing note and vitals reviewed. Constitutional: She is oriented to person, place, and time. She appears  well-developed and well-nourished. No distress.  HENT:  Head: Normocephalic and atraumatic.  Mouth/Throat: Oropharynx is clear and moist.  Eyes: EOM are normal. Pupils are equal, round, and reactive to light.  Neck: Normal range of motion. Neck supple.  Cardiovascular: Normal rate and regular rhythm.   Pulmonary/Chest: Effort normal and breath sounds normal. No respiratory distress. She has no wheezes. She has no rales.  Abdominal: Soft. Bowel sounds are normal. She exhibits no distension and no mass. There is no tenderness. There is no rebound and no guarding.  Musculoskeletal: Normal range of motion. She exhibits tenderness. She exhibits no edema.  Swelling and redness to the dorsum of the right hand overlying the first and second metacarpals. Roughly 10 cm in diameter. Tender to palpation.  Decreased range of motion of the right hand due to swelling and pain. No erythematous tracking up the arm. Distal cap refill is intact.  Neurological: She is alert and oriented to person, place, and time.  Decreased range of motion of the right hand. Sensation is intact.  Skin: Skin is warm and dry. No rash noted. No erythema.  Psychiatric: She has a normal mood and affect. Her behavior is normal.    ED Course  Procedures (including critical care time) Labs Review Labs Reviewed  COMPREHENSIVE METABOLIC PANEL - Abnormal; Notable for the following:    Glucose, Bld 115 (*)    Total Bilirubin 0.2 (*)    GFR calc non Af Amer 65 (*)    GFR calc Af Amer 75 (*)    All other components within normal limits  CBC WITH DIFFERENTIAL    Imaging Review No results found.   EKG Interpretation None      MDM   Final diagnoses:  None    Discussed with Dr. Fredna Dow. Advised to keep the patient n.p.o. and will see in emergency department for likely OR I&D    Julianne Rice, MD 03/18/14 [phone]

## 2014-03-16 NOTE — Transfer of Care (Signed)
Immediate Anesthesia Transfer of Care Note  Patient: Yolanda Walsh  Procedure(s) Performed: Procedure(s): IRRIGATION AND DEBRIDEMENT EXTREMITY (Right)  Patient Location: PACU  Anesthesia Type:General  Level of Consciousness: awake  Airway & Oxygen Therapy: Patient Spontanous Breathing and Patient connected to nasal cannula oxygen  Post-op Assessment: Report given to PACU RN, Post -op Vital signs reviewed and stable and Patient moving all extremities  Post vital signs: Reviewed and stable  Complications: No apparent anesthesia complications

## 2014-03-16 NOTE — Anesthesia Preprocedure Evaluation (Signed)
Anesthesia Evaluation  Patient identified by MRN, date of birth, ID band Patient awake    Reviewed: Allergy & Precautions, H&P , NPO status , Patient's Chart, lab work & pertinent test results  History of Anesthesia Complications Negative for: history of anesthetic complications  Airway Mallampati: II TM Distance: >3 FB Neck ROM: Full    Dental  (+) Teeth Intact   Pulmonary neg pulmonary ROS,  breath sounds clear to auscultation        Cardiovascular negative cardio ROS  Rhythm:Regular     Neuro/Psych negative neurological ROS  negative psych ROS   GI/Hepatic negative GI ROS, Neg liver ROS,   Endo/Other  Morbid obesity  Renal/GU negative Renal ROS     Musculoskeletal   Abdominal   Peds  Hematology negative hematology ROS (+)   Anesthesia Other Findings   Reproductive/Obstetrics                           Anesthesia Physical Anesthesia Plan  ASA: I  Anesthesia Plan: General   Post-op Pain Management:    Induction: Intravenous  Airway Management Planned: LMA  Additional Equipment: None  Intra-op Plan:   Post-operative Plan: Extubation in OR  Informed Consent: I have reviewed the patients History and Physical, chart, labs and discussed the procedure including the risks, benefits and alternatives for the proposed anesthesia with the patient or authorized representative who has indicated his/her understanding and acceptance.   Dental advisory given  Plan Discussed with: CRNA and Surgeon  Anesthesia Plan Comments:         Anesthesia Quick Evaluation

## 2014-03-16 NOTE — Anesthesia Postprocedure Evaluation (Signed)
  Anesthesia Post-op Note  Patient: Yolanda Walsh  Procedure(s) Performed: Procedure(s): IRRIGATION AND DEBRIDEMENT EXTREMITY (Right)  Patient Location: PACU  Anesthesia Type:General  Level of Consciousness: awake  Airway and Oxygen Therapy: Patient Spontanous Breathing  Post-op Pain: none  Post-op Assessment: Post-op Vital signs reviewed, Patient's Cardiovascular Status Stable, Respiratory Function Stable, Patent Airway, No signs of Nausea or vomiting and Pain level controlled  Post-op Vital Signs: Reviewed and stable  Last Vitals:  Filed Vitals:   03/16/14 0545  BP: 114/57  Pulse: 86  Temp: 36.7 C  Resp: 15    Complications: No apparent anesthesia complications

## 2014-03-16 NOTE — Op Note (Signed)
Yolanda Walsh, Yolanda Walsh               ACCOUNT NO.:  [id number]  MEDICAL RECORD NO.:  [id number]  LOCATION:  [facility]                         FACILITY:  [facility]  PHYSICIAN:  Leanora Cover, MD        DATE OF BIRTH:  1950/11/02  DATE OF PROCEDURE:  03/16/2014 DATE OF DISCHARGE:                              OPERATIVE REPORT   PREOPERATIVE DIAGNOSIS:  Right hand infected cat bite.  POSTOPERATIVE DIAGNOSIS:  Right hand infected cat bite.  PROCEDURE:  Incision and drainage, right hand, infected cat bite.  SURGEON:  Leanora Cover, MD  ASSISTANT:  None.  ANESTHESIA:  General.  IV FLUIDS:  Per Anesthesia flow sheet.  ESTIMATED BLOOD LOSS:  Minimal.  COMPLICATIONS:  None.  SPECIMENS:  Right hand aerobic and anaerobic cultures to micro.  TOURNIQUET TIME:  13 minutes.  DISPOSITION:  Stable to PACU.  INDICATIONS:  Ms. Dias is a 63 year old female who states she was bitten by her cat approximately 3 days ago.  She has had increasing swelling, pain, and erythema of the right hand.  She presented to the [facility] Emergency Department where she was evaluated, and I was consulted for management of injury.  She had pain on palpation on the dorsum of the hand and an erythematous and swollen area.  I recommended going to the operating room for incision and drainage.  Risks, benefits, and alternatives of surgery were discussed including risk of blood loss; infection; damage to nerves, vessels, tendons, ligaments, bone; failure of surgery; need for additional surgery; complications with wound healing; continued pain; continued infection; need for repeat irrigation and debridement.  She voiced understanding of these risks and elected to proceed.  OPERATIVE COURSE:  After being identified preoperatively by myself, the patient and I agreed upon procedure and site of procedure.  Surgical site was marked.  The risks, benefits, and alternatives of surgery were reviewed and she wished to proceed.  Surgical  consent had been signed. She was given IV Unasyn in the emergency department.  She was transferred to the operating room, placed on the operating room table in supine position with the right upper extremity on arm board.  General anesthesia was induced by Anesthesiology.  Right upper extremity was prepped and draped in normal sterile orthopedic fashion.  Surgical pause was performed between surgeons, Anesthesia, operating staff, and all were in agreement as to the patient, procedure, and site of procedure. Tourniquet at the proximal aspect of the extremity inflated to 250 mmHg after gravity exsanguination of hand with an Esmarch exsanguination of the forearm.  Incision was made on the dorsum of the hand including the cat bite wound.  This was carried into subcutaneous tissues by spreading technique.  Significant amount of thin watery fluid was encountered, but no purulence.  Cultures were taken for aerobes, anaerobes, and Gram stain.  The wound cavity was delineated.  It was copiously irrigated with 2000 mL of sterile saline by cysto tubing and bulb syringe.  It was then packed open with quarter-inch iodoform gauze.  It was injected with 10 mL of 0.25% plain Marcaine to aid in postoperative analgesia.  It was then dressed with sterile 4x4s and wrapped with a Kerlix  bandage.  A volar splint was placed and wrapped with Kerlix and Ace bandage. Tourniquet deflated at 13 minutes.  Fingertips were pink with brisk capillary refill after deflation of tourniquet.  Operative drapes were broken down.  The patient was awoken from anesthesia safely.  She was transferred back to stretcher and taken to the PACU in stable condition. I will see her in the office in the beginning of next week for operative followup.  I will give her Percocet 5/325, 1-2 p.o. q.6 hours p.r.n. pain, dispensed #40, and Augmentin 875 mg p.o. b.i.d. x10 days.     Leanora Cover, MD     KK/MEDQ  D:  03/16/2014  T:   03/16/2014  Job:  [id number]

## 2014-03-16 NOTE — H&P (Signed)
Yolanda Walsh is an 63 y.o. female.   Chief Complaint: cat bite HPI: 63 yo female states she was bitten by her cat ~ 3 days ago.  Has had increasing swelling, erythema of right hand.  Has had previous bites that did not get infected.  No fevers, chills, night sweats.  Past Medical History  Diagnosis Date  . Lichen sclerosus     temovate cream  . Medial meniscus tear   . Trigger finger   . History of hemorrhoids     w/o complications  . History of rectal fissure   . Osteopenia     dexa 2013  . Hyperlipidemia 04/24/2012    Past Surgical History  Procedure Laterality Date  . Colonoscopy  09/2003  . Partial hysterectomy  1980    for prolapse, ovaries remain  . Left hand surgery  1962    tendon laceration  . Dexa  04/2012    T -2.1 hip, -1.2 spine    Family History  Problem Relation Age of Onset  . Coronary artery disease Brother 40    7 stents  . Cancer Mother     leukemia  . Coronary artery disease Mother 61  . Coronary artery disease Father 68    MI  . Parkinsonism Brother   . Hypertension Brother   . Hypertension Sister   . Crohn's disease Sister   . Diabetes Maternal Grandfather    Social History:  reports that she has never smoked. She has never used smokeless tobacco. She reports that she drinks alcohol. She reports that she does not use illicit drugs.  Allergies: No Known Allergies   (Not in a hospital admission)  Results for orders placed during the hospital encounter of 03/16/14 (from the past 48 hour(s))  CBC WITH DIFFERENTIAL     Status: None   Collection Time    03/15/14 10:01 PM      Result Value Ref Range   WBC 9.2  4.0 - 10.5 K/uL   RBC 4.47  3.87 - 5.11 MIL/uL   Hemoglobin 12.0  12.0 - 15.0 g/dL   HCT 36.5  36.0 - 46.0 %   MCV 81.7  78.0 - 100.0 fL   MCH 26.8  26.0 - 34.0 pg   MCHC 32.9  30.0 - 36.0 g/dL   RDW 13.9  11.5 - 15.5 %   Platelets 235  150 - 400 K/uL   Neutrophils Relative % 71  43 - 77 %   Neutro Abs 6.6  1.7 - 7.7 K/uL   Lymphocytes Relative 22  12 - 46 %   Lymphs Abs 2.0  0.7 - 4.0 K/uL   Monocytes Relative 6  3 - 12 %   Monocytes Absolute 0.6  0.1 - 1.0 K/uL   Eosinophils Relative 1  0 - 5 %   Eosinophils Absolute 0.1  0.0 - 0.7 K/uL   Basophils Relative 0  0 - 1 %   Basophils Absolute 0.0  0.0 - 0.1 K/uL  COMPREHENSIVE METABOLIC PANEL     Status: Abnormal   Collection Time    03/15/14 10:01 PM      Result Value Ref Range   Sodium 141  137 - 147 mEq/L   Potassium 4.1  3.7 - 5.3 mEq/L   Chloride 105  96 - 112 mEq/L   CO2 24  19 - 32 mEq/L   Glucose, Bld 115 (*) 70 - 99 mg/dL   BUN 18  6 - 23 mg/dL  Creatinine, Ser 0.92  0.50 - 1.10 mg/dL   Calcium 9.5  8.4 - 10.5 mg/dL   Total Protein 7.8  6.0 - 8.3 g/dL   Albumin 3.7  3.5 - 5.2 g/dL   AST 22  0 - 37 U/L   ALT 16  0 - 35 U/L   Alkaline Phosphatase 80  39 - 117 U/L   Total Bilirubin 0.2 (*) 0.3 - 1.2 mg/dL   GFR calc non Af Amer 65 (*) >90 mL/min   GFR calc Af Amer 75 (*) >90 mL/min   Comment: (NOTE)     The eGFR has been calculated using the CKD EPI equation.     This calculation has not been validated in all clinical situations.     eGFR's persistently <90 mL/min signify possible Chronic Kidney     Disease.   Anion gap 12  5 - 15    Dg Hand Complete Right  03/16/2014   CLINICAL DATA:  One-day history of cat bite.  Worsening swelling.  EXAM: RIGHT HAND - COMPLETE 3+ VIEW  COMPARISON:  None.  FINDINGS: There is no evidence of fracture or dislocation. There is no evidence of arthropathy or other focal bone abnormality. Soft tissues are unremarkable, no subcutaneous gas or radiopaque foreign bodies.  IMPRESSION: Negative.   Electronically Signed   By: Elon Alas   On: 03/16/2014 02:36     A comprehensive review of systems was negative.  Blood pressure 146/91, pulse 92, temperature 98.4 F (36.9 C), resp. rate 18, height 5' 4"  (1.626 m), weight 84.369 kg (186 lb), SpO2 96.00%.  General appearance: alert, cooperative and appears  stated age Head: Normocephalic, without obvious abnormality, atraumatic Neck: supple, symmetrical, trachea midline Resp: clear to auscultation bilaterally Cardio: regular rate and rhythm GI: non tender Extremities: intact sensation and capillary refill all digits.  +epl/fpl/io.  right hand with erythema and swelling dorsally.  no proximal streaking.  punture wound within erythema.  ttp. Pulses: 2+ and symmetric Skin: Skin color, texture, turgor normal. No rashes or lesions Neurologic: Grossly normal Incision/Wound: As above  Assessment/Plan Right hand infected cat bite.  Recommend OR for I&D of cat bite.  Risks, benefits, and alternatives of surgery were discussed and the patient agrees with the plan of care.   Dontravious Camille R 03/16/2014, 4:04 AM

## 2014-03-16 NOTE — Discharge Instructions (Signed)

## 2014-03-16 NOTE — Op Note (Signed)
[id number] 

## 2014-03-16 NOTE — ED Notes (Signed)
Pt was bitten by cat in right hand. Area is swollen, red and warm to touch.

## 2014-03-19 ENCOUNTER — Encounter (HOSPITAL_COMMUNITY): Payer: Self-pay | Admitting: Orthopedic Surgery

## 2014-03-19 LAB — CULTURE, ROUTINE-ABSCESS

## 2014-03-21 LAB — ANAEROBIC CULTURE

## 2014-04-01 ENCOUNTER — Encounter: Payer: Self-pay | Admitting: Family Medicine

## 2014-04-01 ENCOUNTER — Ambulatory Visit (INDEPENDENT_AMBULATORY_CARE_PROVIDER_SITE_OTHER): Payer: BC Managed Care – PPO | Admitting: Family Medicine

## 2014-04-01 VITALS — BP 142/82 | HR 63 | Temp 98.1°F | Wt 183.8 lb

## 2014-04-01 DIAGNOSIS — E785 Hyperlipidemia, unspecified: Secondary | ICD-10-CM

## 2014-04-01 DIAGNOSIS — Z23 Encounter for immunization: Secondary | ICD-10-CM

## 2014-04-01 DIAGNOSIS — R0789 Other chest pain: Secondary | ICD-10-CM

## 2014-04-01 NOTE — Patient Instructions (Signed)
Flu shot today. EKG today. Return tomorrow morning for fasting labwork. Pass by [facility]'s office for referral to heart doctor - if heart looking ok we will discuss anxiety medication. Return to see me after you see heart doctor (and for physical).

## 2014-04-01 NOTE — Assessment & Plan Note (Addendum)
Overall atypical possibly anxiety related, but given significant cardiac risk factors (HLD, strong fmhx) did recommend further eval by cardiology. Recent EKG at ER reviewed, EKG repeated today: sinus bradycardia 50s, normal axis, intervals, no acute ST/T changes, good R wave progression Pt agrees with plan. F/u with me after cards eval.

## 2014-04-01 NOTE — Assessment & Plan Note (Signed)
Has not been compliant with RYR. rec restart this. Check fasting labwork tomorrow.

## 2014-04-01 NOTE — Progress Notes (Signed)
Pre visit review using our clinic review tool, if applicable. No additional management support is needed unless otherwise documented below in the visit note. 

## 2014-04-01 NOTE — Progress Notes (Addendum)
BP 142/82  Pulse 63  Temp(Src) 98.1 F (36.7 C) (Oral)  Wt 183 lb 12 oz (83.348 kg)  SpO2 97%   CC: ER f/u at [facility]:    Patient ID: Yolanda Walsh, female    DOB: 08-23-1950, 63 y.o.   MRN: [id number]  HPI: Yolanda Walsh is a 63 y.o. female presenting on 04/01/2014 for Hospitalization Follow-up   Recent trip to [facility] for [facility]. Long trip. Likely dehydrated. Friday night started feeling R shoulder pain along with substernal chest tightness. No dyspnea with this. Evaluated at ER - but was unable to stay for admission to r/o MI because her plane was leaving. Not exertional pain, actually no pain with walking through airports over weekend. Significant stress - with wedding, dog sick, recent hand surgery, etc. Thinks anxiety contributing to sxs.  +strong fmhx CAD (mom, dad, brother). Nonsmoker. H/o HLD - has not been taking RYR. Not regular with aspirin use.  Reviewed labwork she brings from ER - Cr 1.0, glu 109, K 4.6, TnI 0.01, WBC 4.5, Hgb 10.9, plt 196, LFTs WNL No records of CXR results. EKG - NSR without acute change.   Recent infected cat bite s/p I&D in OR by Dr. Fredna Dow, completed 10d course bactrim DS. Some nausea from abx.  Relevant past medical, surgical, family and social history reviewed and updated as indicated.  Allergies and medications reviewed and updated. Current Outpatient Prescriptions on File Prior to Visit  Medication Sig  . aspirin EC 81 MG tablet Take 81 mg by mouth daily as needed.   . clobetasol cream (TEMOVATE) 7.34 % Apply 1 application topically 2 (two) times daily as needed. For rash on back  . docusate sodium (COLACE) 100 MG capsule Take 100 mg by mouth as needed.  . loratadine (CLARITIN) 10 MG tablet Take 10 mg by mouth daily as needed for allergies.   No current facility-administered medications on file prior to visit.    Review of Systems Per HPI unless specifically indicated above    Objective:    BP 142/82  Pulse  63  Temp(Src) 98.1 F (36.7 C) (Oral)  Wt 183 lb 12 oz (83.348 kg)  SpO2 97%  Physical Exam  Nursing note and vitals reviewed. Constitutional: She appears well-developed and well-nourished. No distress.  HENT:  Mouth/Throat: Oropharynx is clear and moist. No oropharyngeal exudate.  Eyes: Conjunctivae and EOM are normal. Pupils are equal, round, and reactive to light. No scleral icterus.  Cardiovascular: Normal rate, regular rhythm, normal heart sounds and intact distal pulses.   No murmur heard. Pulmonary/Chest: Effort normal and breath sounds normal. No respiratory distress. She has no wheezes. She has no rales.  Musculoskeletal: She exhibits no edema.  Psychiatric: She has a normal mood and affect.       Assessment & Plan:   Problem List Items Addressed This Visit   Hyperlipidemia     Has not been compliant with RYR. rec restart this. Check fasting labwork tomorrow.    Relevant Orders      Lipid panel   Atypical chest pain - Primary     Overall atypical possibly anxiety related, but given significant cardiac risk factors (HLD, strong fmhx) did recommend further eval by cardiology. Recent EKG at ER reviewed, EKG repeated today: sinus bradycardia 50s, normal axis, intervals, no acute ST/T changes, good R wave progression Pt agrees with plan. F/u with me after cards eval.    Relevant Orders      EKG  12-Lead (Completed)      Ambulatory referral to Cardiology    Other Visit Diagnoses   Need for prophylactic vaccination and inoculation against influenza        Relevant Orders       Flu Vaccine QUAD 36+ mos PF IM (Fluarix Quad PF) (Completed)        Follow up plan: Return as needed, for f/u after heart doctor.

## 2014-04-02 ENCOUNTER — Other Ambulatory Visit (INDEPENDENT_AMBULATORY_CARE_PROVIDER_SITE_OTHER): Payer: BC Managed Care – PPO

## 2014-04-02 DIAGNOSIS — E785 Hyperlipidemia, unspecified: Secondary | ICD-10-CM

## 2014-04-02 LAB — LIPID PANEL
Cholesterol: 198 mg/dL (ref 0–200)
HDL: 38.3 mg/dL — ABNORMAL LOW (ref >39.00)
LDL Cholesterol: 136 mg/dL — ABNORMAL HIGH (ref 0–99)
NONHDL: 159.7
Total CHOL/HDL Ratio: 5
Triglycerides: 118 mg/dL (ref 0.0–149.0)
VLDL: 23.6 mg/dL (ref 0.0–40.0)

## 2014-04-12 ENCOUNTER — Ambulatory Visit (INDEPENDENT_AMBULATORY_CARE_PROVIDER_SITE_OTHER): Payer: BC Managed Care – PPO | Admitting: Cardiovascular Disease

## 2014-04-12 ENCOUNTER — Encounter: Payer: Self-pay | Admitting: Cardiovascular Disease

## 2014-04-12 VITALS — BP 130/84 | HR 72 | Ht 64.0 in | Wt 182.1 lb

## 2014-04-12 DIAGNOSIS — F419 Anxiety disorder, unspecified: Secondary | ICD-10-CM

## 2014-04-12 DIAGNOSIS — E785 Hyperlipidemia, unspecified: Secondary | ICD-10-CM

## 2014-04-12 DIAGNOSIS — R0789 Other chest pain: Secondary | ICD-10-CM

## 2014-04-12 MED ORDER — SERTRALINE HCL 50 MG PO TABS: 50.0000 mg | ORAL_TABLET | Freq: Every day | ORAL | Status: DC

## 2014-04-12 NOTE — Progress Notes (Signed)
Patient ID: Yolanda Walsh, female   DOB: [birth year], 63 y.o.   MRN: [id number]   63 yo referred by Dr Eulah Pont for chest pain.     Evaluated in ER 04/05/14 Was in  [facility]. Long trip. Likely dehydrated. Friday night started feeling R shoulder pain along with substernal chest tightness. No dyspnea with this. Evaluated at ER - but was unable to stay for admission to r/o MI because her plane was leaving. Not exertional pain, actually no pain with walking through airports over weekend.  Significant stress - with wedding, dog sick, recent hand surgery, etc. Thinks anxiety contributing to sxs.  +strong fmhx CAD (mom, dad, brother).  Nonsmoker.  H/o HLD - has not been taking RYR.  Not regular with aspirin use.  Reviewed labwork she brings from ER - Cr 1.0, glu 109, K 4.6, TnI 0.01, WBC 4.5, Hgb 10.9, plt 196, LFTs WNL  No records of CXR results.  EKG - NSR without acute change.   Infected cat bite s/p I&D in OR 03/16/14  by Dr. Fredna Dow, completed 10d course bactrim DS. Some nausea from abx.  She is an extremely anxious individual.  She wanted to be placed on antidepressive with anxiolytic properties and I agree No chest pain since coming back from [place].        ROS: Denies fever, malais, weight loss, blurry vision, decreased visual acuity, cough, sputum, SOB, hemoptysis, pleuritic pain, palpitaitons, heartburn, abdominal pain, melena, lower extremity edema, claudication, or rash.  All other systems reviewed and negative   General: Affect hyperactive/anxoius  Healthy:  appears stated age 47: normal Neck supple with no adenopathy JVP normal no bruits no thyromegaly Lungs clear with no wheezing and good diaphragmatic motion Heart:  S1/S2 no murmur,rub, gallop or click PMI normal Abdomen: benighn, BS positve, no tenderness, no AAA no bruit.  No HSM or HJR Distal pulses intact with no bruits No edema Neuro non-focal Skin warm and dry No muscular  weakness  Medications Current Outpatient Prescriptions  Medication Sig Dispense Refill  . aspirin EC 81 MG tablet Take 81 mg by mouth daily as needed.       . clobetasol cream (TEMOVATE) 0.93 % Apply 1 application topically 2 (two) times daily as needed. For rash on back      . docusate sodium (COLACE) 100 MG capsule Take 100 mg by mouth as needed.      Marland Kitchen ibuprofen (ADVIL,MOTRIN) 200 MG tablet Take 200 mg by mouth every 6 (six) hours as needed.      . loratadine (CLARITIN) 10 MG tablet Take 10 mg by mouth daily as needed for allergies.       No current facility-administered medications for this visit.    Allergies Review of patient's allergies indicates no known allergies.  Family History: Family History  Problem Relation Age of Onset  . Coronary artery disease Brother 40    7 stents  . Cancer Mother     leukemia  . Coronary artery disease Mother 64  . Coronary artery disease Father 4    MI  . Parkinsonism Brother   . Hypertension Brother   . Hypertension Sister   . Crohn's disease Sister   . Diabetes Maternal Grandfather     Social History: History   Social History  . Marital Status: Divorced    Spouse Name: N/A    Number of Children: N/A  . Years of Education: N/A   Occupational History  . Not on file.  Social History Main Topics  . Smoking status: Never Smoker   . Smokeless tobacco: Never Used  . Alcohol Use: Yes     Comment: rare  . Drug Use: No  . Sexual Activity: Not on file   Other Topics Concern  . Not on file   Social History Narrative   Lives alone with cat and dog.  Widow, husband deceased from [place].   Occupation: retired, prior worked in Psychologist, educational   Activity: no regular exercise, has eliptical   Diet: good water, fruits/vegetables daily    Electrocardiogram:  SR rate 55 normal   Assessment and Plan

## 2014-04-12 NOTE — Patient Instructions (Signed)
  Your physician has requested that you have an exercise tolerance test. For further information please visit [email or web address]. Please also follow instruction sheet, as given.  Your physician recommends that you schedule a follow-up appointment as needed with Dr. Johnsie Cancel.

## 2014-04-12 NOTE — Assessment & Plan Note (Signed)
Family history of premature CAD.  Normal ECG  F/U ETT

## 2014-04-12 NOTE — Assessment & Plan Note (Signed)
Will start her on zoloft 50 mg F/U Dr Darnell Level

## 2014-04-12 NOTE — Assessment & Plan Note (Signed)
Cholesterol is at goal.  Continue current dose of statin and diet Rx.  No myalgias or side effects.  F/U  LFT's in 6 months. Lab Results  Component Value Date   LDLCALC 136* 04/02/2014

## 2014-04-30 ENCOUNTER — Telehealth (HOSPITAL_COMMUNITY): Payer: Self-pay

## 2014-04-30 NOTE — Telephone Encounter (Signed)
Encounter complete. 

## 2014-05-02 ENCOUNTER — Ambulatory Visit (HOSPITAL_COMMUNITY)
Admission: RE | Admit: 2014-05-02 | Discharge: 2014-05-02 | Disposition: A | Discharge status: Home / Self Care | Payer: BC Managed Care – PPO | Source: Ambulatory Visit | Attending: Cardiology | Admitting: Cardiology

## 2014-05-02 DIAGNOSIS — R079 Chest pain, unspecified: Secondary | ICD-10-CM | POA: Diagnosis present

## 2014-05-02 DIAGNOSIS — R0789 Other chest pain: Secondary | ICD-10-CM

## 2014-05-03 NOTE — Procedures (Signed)
Exercise Treadmill Test  Test  Exercise Tolerance Test Ordering MD: Jenkins Rouge, MD  Interpreting MD:   Unique Test No:1 Treadmill:  1  Indication for ETT: chest pain - rule out ischemia  Contraindication to ETT: No   Stress Modality: exercise - treadmill  Cardiac Imaging Performed: non   Protocol: standard Bruce - maximal  Max BP:  126/58  Max MPHR (bpm):  157 85% MPR (bpm):  133  MPHR obtained (bpm):  137 % MPHR obtained:  87  Reached 85% MPHR (min:sec):  5:50 Total Exercise Time (min-sec):  6:01  Workload in METS:  7.00 Borg Scale:   Reason ETT Terminated:  dyspnea    ST Segment Analysis At Rest: NSR, nonspecific ST changes With Exercise: non-specific ST changes  Other Information Arrhythmia:  No Angina during ETT:  absent (0) Quality of ETT:  diagnostic  ETT Interpretation:  normal - no evidence of ischemia by ST analysis  Comments: ETT with fair exercise tolerance; normal BP response; no chest pain; nondiagnostic upsloping ST depression; negative adequate ETT.  Kirk Ruths

## 2014-05-08 ENCOUNTER — Telehealth: Payer: Self-pay | Admitting: Cardiovascular Disease

## 2014-05-08 NOTE — Telephone Encounter (Signed)
PT  AWARE OF  GXT  RESULTS ./CY 

## 2014-05-08 NOTE — Telephone Encounter (Signed)
New message ° ° ° ° °Returning Yolanda Walsh's call °

## 2014-05-13 ENCOUNTER — Encounter: Payer: Self-pay | Admitting: Family Medicine

## 2014-05-13 ENCOUNTER — Ambulatory Visit (INDEPENDENT_AMBULATORY_CARE_PROVIDER_SITE_OTHER): Payer: BC Managed Care – PPO | Admitting: Family Medicine

## 2014-05-13 VITALS — BP 110/64 | HR 60 | Temp 97.3°F | Ht 64.0 in | Wt 177.2 lb

## 2014-05-13 DIAGNOSIS — F419 Anxiety disorder, unspecified: Secondary | ICD-10-CM

## 2014-05-13 DIAGNOSIS — Z1211 Encounter for screening for malignant neoplasm of colon: Secondary | ICD-10-CM

## 2014-05-13 DIAGNOSIS — E785 Hyperlipidemia, unspecified: Secondary | ICD-10-CM

## 2014-05-13 DIAGNOSIS — Z0000 Encounter for general adult medical examination without abnormal findings: Secondary | ICD-10-CM

## 2014-05-13 DIAGNOSIS — R0789 Other chest pain: Secondary | ICD-10-CM

## 2014-05-13 NOTE — Progress Notes (Signed)
See paper note.epic unusable  Physical Exam  Constitutional: She is well-developed, well-nourished, and in no distress. No distress.  HENT:  Head: Normocephalic and atraumatic.  Right Ear: Hearing, tympanic membrane, external ear and ear canal normal.  Left Ear: Hearing, tympanic membrane, external ear and ear canal normal.  Nose: Nose normal. No mucosal edema or rhinorrhea.  Mouth/Throat: Uvula is midline, oropharynx is clear and moist and mucous membranes are normal. No oropharyngeal exudate, posterior oropharyngeal edema, posterior oropharyngeal erythema or tonsillar abscesses.  Eyes: Conjunctivae and EOM are normal. Pupils are equal, round, and reactive to light. No scleral icterus.  Neck: Normal range of motion. Neck supple. Carotid bruit is not present. No thyromegaly present.  Cardiovascular: Normal rate, regular rhythm, normal heart sounds and intact distal pulses.   No murmur heard. Pulmonary/Chest: Effort normal and breath sounds normal. No respiratory distress. She has no wheezes. She has no rales.  Abdominal: Soft. Bowel sounds are normal. She exhibits no distension and no mass. There is no tenderness. There is no rebound and no guarding.  Musculoskeletal: Normal range of motion. She exhibits no edema.  Lymphadenopathy:    She has no cervical adenopathy.  Neurological: She is alert. No cranial nerve deficit.  Skin: Skin is warm and dry. No rash noted. She is not diaphoretic.  Psychiatric: Affect normal.  Nursing note and vitals reviewed.

## 2014-05-13 NOTE — Assessment & Plan Note (Addendum)
Preventative protocols reviewed and updated unless pt declined. Discussed healthy diet and lifestyle.  

## 2014-05-13 NOTE — Assessment & Plan Note (Signed)
Encouraged LDL goal <100 given fmhx. Work on diet changes and continued weight loss, rpt labs in 6 mo for recheck.

## 2014-05-13 NOTE — Assessment & Plan Note (Signed)
Marked improvement with zoloft 50mg  daily. Continue.

## 2014-05-13 NOTE — Progress Notes (Signed)
Pre visit review using our clinic review tool, if applicable. No additional management support is needed unless otherwise documented below in the visit note. 

## 2014-05-13 NOTE — Patient Instructions (Signed)
Return as needed or in 1 year for next physical. Return in 6 months for fasting labs to check cholesterol again. Good to see you today, call us with questions

## 2014-05-13 NOTE — Assessment & Plan Note (Signed)
S/p normal ETT. Thought anxiety related.

## 2014-05-16 ENCOUNTER — Other Ambulatory Visit: Payer: BC Managed Care – PPO

## 2014-05-16 DIAGNOSIS — Z1211 Encounter for screening for malignant neoplasm of colon: Secondary | ICD-10-CM

## 2014-05-16 LAB — FECAL OCCULT BLOOD, GUAIAC: Fecal Occult Blood: NEGATIVE

## 2014-05-17 LAB — FECAL OCCULT BLOOD, IMMUNOCHEMICAL: Fecal Occult Bld: NEGATIVE

## 2014-05-20 ENCOUNTER — Encounter: Payer: Self-pay | Admitting: *Deleted

## 2015-03-13 HISTORY — PX: OTHER SURGICAL HISTORY: SHX169

## 2015-03-27 ENCOUNTER — Telehealth: Payer: Self-pay | Admitting: Family Medicine

## 2015-03-27 DIAGNOSIS — M8580 Other specified disorders of bone density and structure, unspecified site: Secondary | ICD-10-CM

## 2015-03-27 NOTE — Telephone Encounter (Signed)
Patient is due for her screening mammogram and dexa scan.  Patient has it done at [facility].  Patient needs an order for the dexa scan.  Patient can't make an appointment for both until she has the order.  Patient said she'll make the appointment herself once the order is done.

## 2015-03-28 ENCOUNTER — Ambulatory Visit (INDEPENDENT_AMBULATORY_CARE_PROVIDER_SITE_OTHER): Payer: BLUE CROSS/BLUE SHIELD

## 2015-03-28 DIAGNOSIS — Z23 Encounter for immunization: Secondary | ICD-10-CM | POA: Diagnosis not present

## 2015-03-28 NOTE — Telephone Encounter (Signed)
Ordered dexa for [facility]. Routed to [facility].

## 2015-03-28 NOTE — Addendum Note (Signed)
Addended by: Ria Bush on: 03/28/2015 08:13 AM   Modules accepted: Orders

## 2015-03-28 NOTE — Telephone Encounter (Signed)
Pt aware order in for bone density Order faxed to [facility] 9/16

## 2015-04-06 ENCOUNTER — Other Ambulatory Visit: Payer: Self-pay | Admitting: Cardiovascular Disease

## 2015-04-07 ENCOUNTER — Other Ambulatory Visit: Payer: Self-pay | Admitting: *Deleted

## 2015-04-07 LAB — HM MAMMOGRAPHY: HM Mammogram: NORMAL

## 2015-04-07 MED ORDER — SERTRALINE HCL 50 MG PO TABS: 50.0000 mg | ORAL_TABLET | Freq: Every day | ORAL | Status: DC

## 2015-04-07 NOTE — Telephone Encounter (Signed)
I don't know what to do with this no question ??

## 2015-04-07 NOTE — Telephone Encounter (Signed)
Patient requested a refill on Sertraline. Patient stated that it was prescribed by Dr. Johnsie Cancel last year when she was having a lot of anxiety. Patient stated that she has a physical scheduled with Dr. Darnell Level 05/20/15 and she was on it last year when she saw Dr. Darnell Level. . Patient stated that she is flying to [facility] Wednesday. Refill sent to pharmacy to last patient until her physical. Advised patient that script will be sent to the pharmacy.

## 2015-04-08 ENCOUNTER — Encounter: Payer: Self-pay | Admitting: *Deleted

## 2015-04-11 ENCOUNTER — Encounter: Payer: Self-pay | Admitting: Family Medicine

## 2015-04-12 ENCOUNTER — Other Ambulatory Visit: Payer: Self-pay | Admitting: Family Medicine

## 2015-04-12 ENCOUNTER — Encounter: Payer: Self-pay | Admitting: Family Medicine

## 2015-04-12 MED ORDER — VITAMIN D3 25 MCG (1000 UT) PO CAPS: 1.0000 | ORAL_CAPSULE | Freq: Every day | ORAL | Status: AC

## 2015-05-12 ENCOUNTER — Other Ambulatory Visit: Payer: Self-pay | Admitting: Family Medicine

## 2015-05-12 DIAGNOSIS — M8580 Other specified disorders of bone density and structure, unspecified site: Secondary | ICD-10-CM

## 2015-05-12 DIAGNOSIS — E785 Hyperlipidemia, unspecified: Secondary | ICD-10-CM

## 2015-05-14 ENCOUNTER — Other Ambulatory Visit: Payer: Self-pay | Admitting: Family Medicine

## 2015-05-14 ENCOUNTER — Other Ambulatory Visit (INDEPENDENT_AMBULATORY_CARE_PROVIDER_SITE_OTHER): Payer: BLUE CROSS/BLUE SHIELD

## 2015-05-14 DIAGNOSIS — Z1159 Encounter for screening for other viral diseases: Secondary | ICD-10-CM

## 2015-05-14 DIAGNOSIS — E785 Hyperlipidemia, unspecified: Secondary | ICD-10-CM

## 2015-05-14 LAB — BASIC METABOLIC PANEL
BUN: 15 mg/dL (ref 6–23)
CHLORIDE: 103 meq/L (ref 96–112)
CO2: 30 mEq/L (ref 19–32)
Calcium: 9.5 mg/dL (ref 8.4–10.5)
Creatinine, Ser: 0.72 mg/dL (ref 0.40–1.20)
GFR: 86.53 mL/min (ref >60.00)
GLUCOSE: 97 mg/dL (ref 70–99)
POTASSIUM: 3.8 meq/L (ref 3.5–5.1)
Sodium: 138 mEq/L (ref 135–145)

## 2015-05-14 LAB — LIPID PANEL
CHOLESTEROL: 240 mg/dL — AB (ref 0–200)
HDL: 47.6 mg/dL (ref >39.00)
LDL Cholesterol: 169 mg/dL — ABNORMAL HIGH (ref 0–99)
NonHDL: 191.97
TRIGLYCERIDES: 114 mg/dL (ref 0.0–149.0)
Total CHOL/HDL Ratio: 5
VLDL: 22.8 mg/dL (ref 0.0–40.0)

## 2015-05-15 LAB — HEPATITIS C ANTIBODY: HCV AB: NEGATIVE

## 2015-05-20 ENCOUNTER — Ambulatory Visit (INDEPENDENT_AMBULATORY_CARE_PROVIDER_SITE_OTHER): Payer: BLUE CROSS/BLUE SHIELD | Admitting: Family Medicine

## 2015-05-20 ENCOUNTER — Encounter: Payer: Self-pay | Admitting: Family Medicine

## 2015-05-20 VITALS — BP 107/68 | HR 68 | Temp 98.0°F | Ht 64.72 in | Wt 177.2 lb

## 2015-05-20 DIAGNOSIS — Z1211 Encounter for screening for malignant neoplasm of colon: Secondary | ICD-10-CM

## 2015-05-20 DIAGNOSIS — E785 Hyperlipidemia, unspecified: Secondary | ICD-10-CM

## 2015-05-20 DIAGNOSIS — F419 Anxiety disorder, unspecified: Secondary | ICD-10-CM

## 2015-05-20 DIAGNOSIS — Z0000 Encounter for general adult medical examination without abnormal findings: Secondary | ICD-10-CM | POA: Diagnosis not present

## 2015-05-20 DIAGNOSIS — M8580 Other specified disorders of bone density and structure, unspecified site: Secondary | ICD-10-CM

## 2015-05-20 NOTE — Progress Notes (Signed)
BP 107/68 mmHg  Pulse 68  Temp(Src) 98 F (36.7 C) (Oral)  Ht 5' 4.72" (1.644 m)  Wt 177 lb 4 oz (80.4 kg)  BMI 29.75 kg/m2   CC: CPE  Subjective:    Patient ID: Yolanda Walsh, female    DOB: 05/24/1951, 64 y.o.   MRN: [id number]  HPI: Yolanda Walsh is a 64 y.o. female presenting on 05/20/2015 for Annual Exam   Doing remarkably well with sertraline 50mg  daily. Wonders if she would do well with 25mg  dose.   Preventative: Colonoscopy ~2005 according to pt normal. Requests iFOB now.  Well woman with Dr. Marvel Plan h/o partal hysterectomy. Pending 07/2015.  Mammogram - WNL 03/2015 DEXA 03/2015 T -2.4 hip, -1.7 spine Flu - yearly Tdap 2012. Shingles - 2013 Seat belt use discussed Sunscreen use discussed. No changing moles on skin.  Lives alone with cat and dog.daughter nearby. Widow, husband deceased from [place]. Occupation: retired, prior worked in Psychologist, educational Activity: no regular exercise, has eliptical Diet: good water, fruits/vegetables daily  Relevant past medical, surgical, family and social history reviewed and updated as indicated. Interim medical history since our last visit reviewed. Allergies and medications reviewed and updated. Current Outpatient Prescriptions on File Prior to Visit  Medication Sig  . Cholecalciferol (VITAMIN D3) 1000 UNITS CAPS Take 1 capsule (1,000 Units total) by mouth daily.  Marland Kitchen ibuprofen (ADVIL,MOTRIN) 200 MG tablet Take 200 mg by mouth every 6 (six) hours as needed.  . sertraline (ZOLOFT) 50 MG tablet Take 1 tablet (50 mg total) by mouth daily.  Marland Kitchen aspirin EC 81 MG tablet Take 81 mg by mouth daily as needed.   . clobetasol cream (TEMOVATE) 5.88 % Apply 1 application topically 2 (two) times daily as needed. For rash on back   No current facility-administered medications on file prior to visit.    Review of Systems  Constitutional: Negative for fever, chills, activity change, appetite change, fatigue and unexpected weight change.  HENT:  Negative for hearing loss.   Eyes: Negative for visual disturbance.  Respiratory: Negative for cough, chest tightness, shortness of breath and wheezing.   Cardiovascular: Negative for chest pain, palpitations and leg swelling.  Gastrointestinal: Negative for nausea, vomiting, abdominal pain, diarrhea, constipation, blood in stool and abdominal distention.  Genitourinary: Negative for hematuria and difficulty urinating.  Musculoskeletal: Negative for myalgias, arthralgias and neck pain.  Skin: Negative for rash.  Neurological: Negative for dizziness, seizures, syncope and headaches.  Hematological: Negative for adenopathy. Does not bruise/bleed easily.  Psychiatric/Behavioral: Negative for dysphoric mood. The patient is not nervous/anxious.    Per HPI unless specifically indicated in ROS section     Objective:    BP 107/68 mmHg  Pulse 68  Temp(Src) 98 F (36.7 C) (Oral)  Ht 5' 4.72" (1.644 m)  Wt 177 lb 4 oz (80.4 kg)  BMI 29.75 kg/m2  Wt Readings from Last 3 Encounters:  05/20/15 177 lb 4 oz (80.4 kg)  05/13/14 177 lb 4 oz (80.4 kg)  04/12/14 182 lb 1.9 oz (82.609 kg)    Physical Exam  Constitutional: She is oriented to person, place, and time. She appears well-developed and well-nourished. No distress.  HENT:  Head: Normocephalic and atraumatic.  Right Ear: Hearing, tympanic membrane, external ear and ear canal normal.  Left Ear: Hearing, tympanic membrane, external ear and ear canal normal.  Nose: Nose normal.  Mouth/Throat: Uvula is midline, oropharynx is clear and moist and mucous membranes are normal. No oropharyngeal exudate, posterior oropharyngeal edema or  posterior oropharyngeal erythema.  Eyes: Conjunctivae and EOM are normal. Pupils are equal, round, and reactive to light. No scleral icterus.  Neck: Normal range of motion. Neck supple. No thyromegaly present.  Cardiovascular: Normal rate, regular rhythm, normal heart sounds and intact distal pulses.   No murmur  heard. Pulses:      Radial pulses are 2+ on the right side, and 2+ on the left side.  Pulmonary/Chest: Effort normal and breath sounds normal. No respiratory distress. She has no wheezes. She has no rales.  Abdominal: Soft. Bowel sounds are normal. She exhibits no distension and no mass. There is no tenderness. There is no rebound and no guarding.  Musculoskeletal: Normal range of motion. She exhibits no edema.  Lymphadenopathy:    She has no cervical adenopathy.  Neurological: She is alert and oriented to person, place, and time.  CN grossly intact, station and gait intact  Skin: Skin is warm and dry. No rash noted.  Psychiatric: She has a normal mood and affect. Her behavior is normal. Judgment and thought content normal.  Nursing note and vitals reviewed.  Results for orders placed or performed in visit on 05/14/15  Lipid panel  Result Value Ref Range   Cholesterol 240 (H) 0 - 200 mg/dL   Triglycerides 114.0 0.0 - 149.0 mg/dL   HDL 47.60 >39.00 mg/dL   VLDL 22.8 0.0 - 40.0 mg/dL   LDL Cholesterol 169 (H) 0 - 99 mg/dL   Total CHOL/HDL Ratio 5    NonHDL 235.57   Basic metabolic panel  Result Value Ref Range   Sodium 138 135 - 145 mEq/L   Potassium 3.8 3.5 - 5.1 mEq/L   Chloride 103 96 - 112 mEq/L   CO2 30 19 - 32 mEq/L   Glucose, Bld 97 70 - 99 mg/dL   BUN 15 6 - 23 mg/dL   Creatinine, Ser 0.72 0.40 - 1.20 mg/dL   Calcium 9.5 8.4 - 10.5 mg/dL   GFR 86.53 >60.00 mL/min      Assessment & Plan:   Problem List Items Addressed This Visit    Osteopenia    Reviewed dexa results showing progression of osteopenia. Discussed importance of daily calcium and vitamin D - start 1 tablet cal/vit D + 1000 IU vit D as pt not achieving goal intake through diet. Encouraged walking.      Hyperlipidemia    Discussed with patient. Not interested in medication. Provided with info on diet/lifestyle changes to improve #s.       Healthcare maintenance - Primary    Preventative protocols  reviewed and updated unless pt declined. Discussed healthy diet and lifestyle.       Anxiety    Marked improvement on zoloft - but pt wonders if lack of motivation is due to medication. Will trial 25mg  dose and update me with effect.        Other Visit Diagnoses    Special screening for malignant neoplasms, colon        Relevant Orders    Fecal occult blood, imunochemical        Follow up plan: Return in about 1 year (around 05/19/2016), or as needed, for annual exam, prior fasting for blood work.

## 2015-05-20 NOTE — Progress Notes (Signed)
Pre visit review using our clinic review tool, if applicable. No additional management support is needed unless otherwise documented below in the visit note. 

## 2015-05-20 NOTE — Assessment & Plan Note (Addendum)
Reviewed dexa results showing progression of osteopenia. Discussed importance of daily calcium and vitamin D - start 1 tablet cal/vit D + 1000 IU vit D as pt not achieving goal intake through diet. Encouraged walking.

## 2015-05-20 NOTE — Assessment & Plan Note (Signed)
Discussed with patient. Not interested in medication. Provided with info on diet/lifestyle changes to improve #s.

## 2015-05-20 NOTE — Patient Instructions (Addendum)
Cut sertraline to 1/2 tablet daily and let us know if doing well to send in lower dose.  Pass by lab for stool kit. Work on lowering cholesterol levels. More fruits and vegetables, more fish, less red meat and dairy products.  More soy, nuts, beans, barley, lentils, oats and plant sterol ester enriched margarine instead of butter. You are doing well. Return as needed or in 1 year for next physical.

## 2015-05-20 NOTE — Assessment & Plan Note (Signed)
Marked improvement on zoloft - but pt wonders if lack of motivation is due to medication. Will trial 25mg  dose and update me with effect.

## 2015-05-20 NOTE — Assessment & Plan Note (Signed)
Preventative protocols reviewed and updated unless pt declined. Discussed healthy diet and lifestyle.  

## 2015-05-26 ENCOUNTER — Other Ambulatory Visit: Payer: BLUE CROSS/BLUE SHIELD

## 2015-05-26 DIAGNOSIS — Z1211 Encounter for screening for malignant neoplasm of colon: Secondary | ICD-10-CM

## 2015-05-26 LAB — FECAL OCCULT BLOOD, IMMUNOCHEMICAL: Fecal Occult Bld: NEGATIVE

## 2015-05-27 ENCOUNTER — Encounter: Payer: Self-pay | Admitting: *Deleted

## 2015-07-15 ENCOUNTER — Other Ambulatory Visit: Payer: Self-pay | Admitting: Family Medicine

## 2016-03-31 ENCOUNTER — Ambulatory Visit (INDEPENDENT_AMBULATORY_CARE_PROVIDER_SITE_OTHER): Payer: PPO

## 2016-03-31 DIAGNOSIS — Z23 Encounter for immunization: Secondary | ICD-10-CM

## 2016-04-09 DIAGNOSIS — Z1231 Encounter for screening mammogram for malignant neoplasm of breast: Secondary | ICD-10-CM | POA: Diagnosis not present

## 2016-04-09 LAB — HM MAMMOGRAPHY

## 2016-04-12 ENCOUNTER — Ambulatory Visit (INDEPENDENT_AMBULATORY_CARE_PROVIDER_SITE_OTHER): Payer: PPO | Admitting: Sports Medicine

## 2016-04-12 ENCOUNTER — Encounter: Payer: Self-pay | Admitting: Family Medicine

## 2016-04-12 DIAGNOSIS — S8261XA Displaced fracture of lateral malleolus of right fibula, initial encounter for closed fracture: Secondary | ICD-10-CM

## 2016-04-13 ENCOUNTER — Encounter: Payer: Self-pay | Admitting: *Deleted

## 2016-04-26 ENCOUNTER — Ambulatory Visit (INDEPENDENT_AMBULATORY_CARE_PROVIDER_SITE_OTHER): Payer: PPO | Admitting: Sports Medicine

## 2016-04-26 DIAGNOSIS — S8261XD Displaced fracture of lateral malleolus of right fibula, subsequent encounter for closed fracture with routine healing: Secondary | ICD-10-CM

## 2016-05-10 ENCOUNTER — Ambulatory Visit (INDEPENDENT_AMBULATORY_CARE_PROVIDER_SITE_OTHER): Payer: PPO | Admitting: Sports Medicine

## 2016-05-10 ENCOUNTER — Encounter (INDEPENDENT_AMBULATORY_CARE_PROVIDER_SITE_OTHER): Payer: Self-pay | Admitting: Sports Medicine

## 2016-05-10 ENCOUNTER — Ambulatory Visit (INDEPENDENT_AMBULATORY_CARE_PROVIDER_SITE_OTHER): Payer: PPO

## 2016-05-10 VITALS — BP 142/80 | HR 65 | Ht 64.72 in | Wt 180.0 lb

## 2016-05-10 DIAGNOSIS — M25571 Pain in right ankle and joints of right foot: Secondary | ICD-10-CM

## 2016-05-10 NOTE — Progress Notes (Deleted)
   Yolanda Walsh - 65 y.o. female MRN [id number]  Date of birth: 1951-03-17  Office Visit Note: Visit Date: 05/10/2016 PCP: Ria Bush, MD Referred by: Ria Bush, MD  Subjective: Chief Complaint  Patient presents with  . Right Ankle - Follow-up  . 2 week ROV   HPI: Patient states doing okay.  Wearing fracture boot.      ROS Otherwise per HPI.  Assessment & Plan: Visit Diagnoses: No diagnosis found.  Plan: No additional findings.  Patient Instructions:   {Plan narrative}               Meds & Orders: No orders of the defined types were placed in this encounter.  No orders of the defined types were placed in this encounter.   Follow-up: No Follow-up on file.   Procedures: No procedures performed  No notes on file   Clinical History: No specialty comments available.  She reports that she has never smoked. She has never used smokeless tobacco. No results for input(s): HGBA1C, LABURIC in the last 8760 hours.  Objective:  VS:  HT:    WT:   BMI:     BP:   HR: bpm  TEMP: ( )  RESP:  Physical Exam  Ortho Exam Imaging: No results found.  Past Medical/Family/Surgical/Social History: Medications & Allergies reviewed per EMR Patient Active Problem List   Diagnosis Date Noted  . Right low back pain 11/12/2013  . Tinnitus 01/29/2013  . Healthcare maintenance 04/24/2012  . Hyperlipidemia 04/24/2012  . Groin pain 03/23/2012  . Osteopenia 01/17/2008  . Atypical chest pain 01/17/2008  . Anxiety 01/17/2008   Past Medical History:  Diagnosis Date  . History of anal fissures 2013   worsened by constipation   . History of hemorrhoids    w/o complications  . History of rectal fissure   . Hyperlipidemia 04/24/2012  . Lichen sclerosus    temovate cream  . Medial meniscus tear   . Osteopenia    dexa 2013, 2016  . Trigger finger    Family History  Problem Relation Age of Onset  . Coronary artery disease Brother 40    7 stents  . Cancer Mother    leukemia  . Coronary artery disease Mother 8  . Coronary artery disease Father 28    MI  . Parkinsonism Brother   . Hypertension Brother   . Hypertension Sister   . Crohn's disease Sister   . Diabetes Maternal Grandfather    Past Surgical History:  Procedure Laterality Date  . COLONOSCOPY  09/2003  . dexa  04/2012   T -2.1 hip, -1.2 spine  . dexa  03/2015   T -2.4 hip, -1.7 spine  . I&D EXTREMITY Right 03/16/2014   cat bite hand s/p I&D in OR ([name])  . left hand surgery  1962   tendon laceration  . PARTIAL HYSTERECTOMY  1980   for prolapse, ovaries remain   Social History   Occupational History  . Not on file.   Social History Main Topics  . Smoking status: Never Smoker  . Smokeless tobacco: Never Used  . Alcohol use Yes     Comment: rare  . Drug use: No  . Sexual activity: Not on file

## 2016-05-10 NOTE — Progress Notes (Signed)
Yolanda Walsh - 65 y.o. female MRN [id number]  Date of birth: Dec 01, 1950  Office Visit Note: Visit Date: 05/10/2016 PCP: Ria Bush, MD Referred by: Ria Bush, MD  Subjective: Chief Complaint  Patient presents with  . Right Ankle - Follow-up  . 2 week ROV   HPI: Patient is doing remarkably well. She reports essentially no pain at this time. She has been compliant with the fracture boot including sleepiness. She is regained the majority of her range of motion.    ROS Otherwise per HPI.  Assessment & Plan: Visit Diagnoses:  1. Acute right ankle pain     Plan: Findings:  Doing well. We will progress.  ASO today he is only been 4 weeks. This is stable fracture & she is only minimally tender to palpation. She should remain in the fracture boot when she isn't doing any type on even surfaces walking for around the house day-to-day mobilization ASO should sufficient. No x-rays needed at follow-up unless persistently symptomatic.   Patient Instructions:                  Meds & Orders: No orders of the defined types were placed in this encounter.   Orders Placed This Encounter  Procedures  . XR Ankle Complete Right    Follow-up: Return in about 3 weeks (around 05/31/2016).   Procedures: No procedures performed  No notes on file   Clinical History: No specialty comments available.  She reports that she has never smoked. She has never used smokeless tobacco. No results for input(s): HGBA1C, LABURIC in the last 8760 hours.  Objective:  VS:  HT:5' 4.72" (164.4 cm)   WT:180 lb (81.6 kg)  BMI:30.3    BP:(!) 142/80  HR:65bpm  TEMP: ( )  RESP:  Physical Exam  Constitutional: No distress.  HENT:  Head: Normocephalic and atraumatic.  Eyes: Right eye exhibits no discharge. Left eye exhibits no discharge. No scleral icterus.  Pulmonary/Chest: Effort normal. No respiratory distress.  Neurological: She is alert.  Appropriately interactive.  Skin: Skin is warm and  dry. No rash noted. She is not diaphoretic. No erythema. No pallor.  Psychiatric: Judgment normal.    Right Ankle Exam   Comments:  Well aligned. Small residual swelling along the lateral malleolus. Mild tenderness to palpation along the distal fibula. Ankle drawer testing is normal. Ankle inversion, eversion, plantarflexion & dorsiflexion strength intact. Range of motion limited by 20. DP & PT pulses 2+/4.     Imaging: Xr Ankle Complete Right  Result Date: 05/10/2016 Findings: Well aligned ankle. Healing Weber C fracture Impression: Healing Weber C fracture.    Past Medical/Family/Surgical/Social History: Medications & Allergies reviewed per EMR Patient Active Problem List   Diagnosis Date Noted  . Right low back pain 11/12/2013  . Tinnitus 01/29/2013  . Healthcare maintenance 04/24/2012  . Hyperlipidemia 04/24/2012  . Groin pain 03/23/2012  . Osteopenia 01/17/2008  . Atypical chest pain 01/17/2008  . Anxiety 01/17/2008   Past Medical History:  Diagnosis Date  . History of anal fissures 2013   worsened by constipation   . History of hemorrhoids    w/o complications  . History of rectal fissure   . Hyperlipidemia 04/24/2012  . Lichen sclerosus    temovate cream  . Medial meniscus tear   . Osteopenia    dexa 2013, 2016  . Trigger finger    Family History  Problem Relation Age of Onset  . Coronary artery disease Brother 21  7 stents  . Cancer Mother     leukemia  . Coronary artery disease Mother 22  . Coronary artery disease Father 86    MI  . Parkinsonism Brother   . Hypertension Brother   . Hypertension Sister   . Crohn's disease Sister   . Diabetes Maternal Grandfather    Past Surgical History:  Procedure Laterality Date  . COLONOSCOPY  09/2003  . dexa  04/2012   T -2.1 hip, -1.2 spine  . dexa  03/2015   T -2.4 hip, -1.7 spine  . I&D EXTREMITY Right 03/16/2014   cat bite hand s/p I&D in OR ([name])  . left hand surgery  1962   tendon laceration    . PARTIAL HYSTERECTOMY  1980   for prolapse, ovaries remain   Social History   Occupational History  . Not on file.   Social History Main Topics  . Smoking status: Never Smoker  . Smokeless tobacco: Never Used  . Alcohol use Yes     Comment: rare  . Drug use: No  . Sexual activity: Not on file

## 2016-05-18 ENCOUNTER — Other Ambulatory Visit: Payer: Self-pay | Admitting: Family Medicine

## 2016-05-18 DIAGNOSIS — E785 Hyperlipidemia, unspecified: Secondary | ICD-10-CM

## 2016-05-20 ENCOUNTER — Telehealth: Payer: Self-pay | Admitting: Family Medicine

## 2016-05-20 ENCOUNTER — Ambulatory Visit: Payer: BLUE CROSS/BLUE SHIELD

## 2016-05-20 ENCOUNTER — Other Ambulatory Visit: Payer: BLUE CROSS/BLUE SHIELD

## 2016-05-20 ENCOUNTER — Encounter: Payer: Self-pay | Admitting: Gastroenterology

## 2016-05-20 ENCOUNTER — Other Ambulatory Visit (INDEPENDENT_AMBULATORY_CARE_PROVIDER_SITE_OTHER): Payer: PPO

## 2016-05-20 DIAGNOSIS — E785 Hyperlipidemia, unspecified: Secondary | ICD-10-CM | POA: Diagnosis not present

## 2016-05-20 LAB — BASIC METABOLIC PANEL
BUN: 14 mg/dL (ref 6–23)
CHLORIDE: 105 meq/L (ref 96–112)
CO2: 27 mEq/L (ref 19–32)
CREATININE: 0.85 mg/dL (ref 0.40–1.20)
Calcium: 9.8 mg/dL (ref 8.4–10.5)
GFR: 71.22 mL/min (ref >60.00)
GLUCOSE: 100 mg/dL — AB (ref 70–99)
POTASSIUM: 3.8 meq/L (ref 3.5–5.1)
Sodium: 139 mEq/L (ref 135–145)

## 2016-05-20 LAB — LIPID PANEL
CHOLESTEROL: 236 mg/dL — AB (ref 0–200)
HDL: 51.8 mg/dL (ref >39.00)
LDL CALC: 159 mg/dL — AB (ref 0–99)
NonHDL: 184.08
TRIGLYCERIDES: 123 mg/dL (ref 0.0–149.0)
Total CHOL/HDL Ratio: 5
VLDL: 24.6 mg/dL (ref 0.0–40.0)

## 2016-05-20 NOTE — Telephone Encounter (Signed)
Opened in error

## 2016-05-25 ENCOUNTER — Ambulatory Visit (INDEPENDENT_AMBULATORY_CARE_PROVIDER_SITE_OTHER): Payer: PPO | Admitting: Gastroenterology

## 2016-05-25 ENCOUNTER — Encounter: Payer: Self-pay | Admitting: Gastroenterology

## 2016-05-25 VITALS — BP 130/70 | Ht 64.0 in | Wt 184.0 lb

## 2016-05-25 DIAGNOSIS — Z1211 Encounter for screening for malignant neoplasm of colon: Secondary | ICD-10-CM | POA: Diagnosis not present

## 2016-05-25 DIAGNOSIS — K602 Anal fissure, unspecified: Secondary | ICD-10-CM

## 2016-05-25 MED ORDER — AMBULATORY NON FORMULARY MEDICATION: 0 refills | Status: DC

## 2016-05-25 MED ORDER — NA SULFATE-K SULFATE-MG SULF 17.5-3.13-1.6 GM/177ML PO SOLN: ORAL | 0 refills | Status: DC

## 2016-05-25 NOTE — Progress Notes (Signed)
05/25/2016 Yolanda Walsh [id number] 10/25/1950   HISTORY OF PRESENT ILLNESS:  This is a pleasant known to our office and was referred here by her PCP, Dr. Danise Mina, in order to schedule colonoscopy but also for evaluation regarding rectal pain and suspected fissure. She says that she had a colonoscopy in 2005 and she recalls being normal at that time. She is unsure who performed that study we do not have records of that here. She says that she has had fissures in the past.  She reports some burning/stinging discomfort in her rectum for the past several weeks. Has been using Preparation H and witch hazel without relief. Hurts to have a bowel movement. She denies any bleeding. She does report difficulty moving her bowels. Says that they are not hard, but has to push posteriorly on her perineum to help her pass her stool.  Some bilateral lower abdominal/pelvic discomfort recently as well but nothing extremely uncomfortable.  Follows with GYN regularly as well.   Past Medical History:  Diagnosis Date  . History of anal fissures 2013   worsened by constipation   . History of hemorrhoids    w/o complications  . History of rectal fissure   . Hyperlipidemia 04/24/2012  . Lichen sclerosus    temovate cream  . Medial meniscus tear   . Osteopenia    dexa 2013, 2016  . Trigger finger    Past Surgical History:  Procedure Laterality Date  . COLONOSCOPY  09/2003  . dexa  04/2012   T -2.1 hip, -1.2 spine  . dexa  03/2015   T -2.4 hip, -1.7 spine  . I&D EXTREMITY Right 03/16/2014   cat bite hand s/p I&D in OR ([name])  . left hand surgery  1962   tendon laceration  . PARTIAL HYSTERECTOMY  1980   for prolapse, ovaries remain    reports that she has never smoked. She has never used smokeless tobacco. She reports that she drinks alcohol. She reports that she does not use drugs. family history includes Cancer in her mother; Coronary artery disease (age of onset: 25) in her brother; Coronary  artery disease (age of onset: 23) in her father; Coronary artery disease (age of onset: 87) in her mother; Crohn's disease in her sister; Diabetes in her maternal grandfather; Hypertension in her brother and sister; Parkinsonism in her brother. No Known Allergies    Outpatient Encounter Prescriptions as of 05/25/2016  Medication Sig  . aspirin EC 81 MG tablet Take 81 mg by mouth daily as needed.   . Calcium Carb-Cholecalciferol (CALCIUM-VITAMIN D) 600-400 MG-UNIT TABS Take 1 tablet by mouth daily.  . cetirizine (ZYRTEC) 10 MG tablet Take 10 mg by mouth daily.  . Cholecalciferol (VITAMIN D3) 1000 UNITS CAPS Take 1 capsule (1,000 Units total) by mouth daily.  Marland Kitchen ibuprofen (ADVIL,MOTRIN) 200 MG tablet Take 200 mg by mouth every 6 (six) hours as needed.  . sertraline (ZOLOFT) 50 MG tablet TAKE ONE TABLET BY MOUTH ONCE DAILY  . [DISCONTINUED] clobetasol cream (TEMOVATE) [id number] % Apply 1 application topically 2 (two) times daily as needed. For rash on back   No facility-administered encounter medications on file as of 05/25/2016.      REVIEW OF SYSTEMS  : All other systems reviewed and negative except where noted in the History of Present Illness.   PHYSICAL EXAM: BP 130/70   Ht 5\' 4"  (1.626 m)   Wt 184 lb (83.5 kg)   BMI 31.58 kg/m  General: Well  developed white female in no acute distress Head: Normocephalic and atraumatic Eyes:  Sclerae anicteric, conjunctiva pink. Ears: Normal auditory acuity Lungs: Clear throughout to auscultation Heart: Regular rate and rhythm Abdomen: Soft, non-distended.  Normal bowel sounds.  Non-tender. Rectal:  Anal fissure noted on external exam in anterior position.  Tender on exam.  DRE did not reveal any masses but was also tender anteriorly. Musculoskeletal: Symmetrical with no gross deformities  Skin: No lesions on visible extremities Extremities: No edema  Neurological: Alert oriented x 4, grossly non-focal Psychological:  Alert and cooperative. Normal  mood and affect  ASSESSMENT AND PLAN: -Screening colonoscopy:  Will schedule with Dr. Henrene Pastor.  The risks, benefits, and alternatives to colonoscopy were discussed with the patient and she consents to proceed.  She would like to schedule this into January to give her anal fissure time to heal. -Anal fissure:  Will use nitroglycerin ointment 0.125 mg TID for 6-8 weeks.  Rectal care instructions reviewed. -Constipation:  Likely has pelvic floor dysfunction vs rectocele.   CC:  Ria Bush, MD

## 2016-05-25 NOTE — Patient Instructions (Signed)
You have been scheduled for a colonoscopy. Please follow written instructions given to you at your visit today.  Please pick up your prep supplies at the pharmacy within the next 1-3 days. If you use inhalers (even only as needed), please bring them with you on the day of your procedure. Your physician has requested that you go to www.startemmi.com and enter the access code given to you at your visit today. This web site gives a general overview about your procedure. However, you should still follow specific instructions given to you by our office regarding your preparation for the procedure.  We have sent a prescription for nitroglycerin 0.125% gel to [facility]. You should apply a pea size amount to your rectum three times daily x 6-8 weeks.  [facility]'s information is below: Address: [address], [place], [place] [address]  [phone]

## 2016-05-26 DIAGNOSIS — H2513 Age-related nuclear cataract, bilateral: Secondary | ICD-10-CM | POA: Diagnosis not present

## 2016-05-26 NOTE — Progress Notes (Signed)
Initial assessment and plans noted 

## 2016-05-27 ENCOUNTER — Ambulatory Visit (INDEPENDENT_AMBULATORY_CARE_PROVIDER_SITE_OTHER): Payer: PPO | Admitting: Family Medicine

## 2016-05-27 ENCOUNTER — Encounter: Payer: Self-pay | Admitting: Family Medicine

## 2016-05-27 VITALS — BP 118/70 | HR 60 | Temp 98.6°F | Ht 64.25 in | Wt 185.0 lb

## 2016-05-27 DIAGNOSIS — Z7189 Other specified counseling: Secondary | ICD-10-CM | POA: Insufficient documentation

## 2016-05-27 DIAGNOSIS — E66811 Obesity, class 1: Secondary | ICD-10-CM | POA: Insufficient documentation

## 2016-05-27 DIAGNOSIS — E669 Obesity, unspecified: Secondary | ICD-10-CM | POA: Insufficient documentation

## 2016-05-27 DIAGNOSIS — Z0000 Encounter for general adult medical examination without abnormal findings: Secondary | ICD-10-CM | POA: Insufficient documentation

## 2016-05-27 DIAGNOSIS — K602 Anal fissure, unspecified: Secondary | ICD-10-CM

## 2016-05-27 DIAGNOSIS — M8580 Other specified disorders of bone density and structure, unspecified site: Secondary | ICD-10-CM

## 2016-05-27 DIAGNOSIS — E785 Hyperlipidemia, unspecified: Secondary | ICD-10-CM

## 2016-05-27 DIAGNOSIS — Z23 Encounter for immunization: Secondary | ICD-10-CM | POA: Diagnosis not present

## 2016-05-27 DIAGNOSIS — F419 Anxiety disorder, unspecified: Secondary | ICD-10-CM

## 2016-05-27 NOTE — Assessment & Plan Note (Signed)

## 2016-05-27 NOTE — Assessment & Plan Note (Signed)
Appreciate GI care of patient. 

## 2016-05-27 NOTE — Assessment & Plan Note (Signed)
Reviewed readings with patient. Pt will work towards healthy diet and lifestyle to improve readings.

## 2016-05-27 NOTE — Progress Notes (Signed)
Pre visit review using our clinic review tool, if applicable. No additional management support is needed unless otherwise documented below in the visit note. 

## 2016-05-27 NOTE — Assessment & Plan Note (Signed)
Discussed healthy diet and lifestyle changes to affect sustainable weight loss  

## 2016-05-27 NOTE — Patient Instructions (Addendum)
prevnar today EKG normal today Cholesterol was a bit elevated - take advantage of silver fit when cleared by ortho.  Return as needed or in 1 year for next physical.  Health Maintenance, Female Introduction Adopting a healthy lifestyle and getting preventive care can go a long way to promote health and wellness. Talk with your health care provider about what schedule of regular examinations is right for you. This is a good chance for you to check in with your provider about disease prevention and staying healthy. In between checkups, there are plenty of things you can do on your own. Experts have done a lot of research about which lifestyle changes and preventive measures are most likely to keep you healthy. Ask your health care provider for more information. Weight and diet Eat a healthy diet  Be sure to include plenty of vegetables, fruits, low-fat dairy products, and lean protein.  Do not eat a lot of foods high in solid fats, added sugars, or salt.  Get regular exercise. This is one of the most important things you can do for your health.  Most adults should exercise for at least 150 minutes each week. The exercise should increase your heart rate and make you sweat (moderate-intensity exercise).  Most adults should also do strengthening exercises at least twice a week. This is in addition to the moderate-intensity exercise. Maintain a healthy weight  Body mass index (BMI) is a measurement that can be used to identify possible weight problems. It estimates body fat based on height and weight. Your health care provider can help determine your BMI and help you achieve or maintain a healthy weight.  For females 76 years of age and older:  A BMI below 18.5 is considered underweight.  A BMI of 18.5 to 24.9 is normal.  A BMI of 25 to 29.9 is considered overweight.  A BMI of 30 and above is considered obese. Watch levels of cholesterol and blood lipids  You should start having your  blood tested for lipids and cholesterol at 65 years of age, then have this test every 5 years.  You may need to have your cholesterol levels checked more often if:  Your lipid or cholesterol levels are high.  You are older than 65 years of age.  You are at high risk for heart disease. Cancer screening Lung Cancer  Lung cancer screening is recommended for adults 81-55 years old who are at high risk for lung cancer because of a history of smoking.  A yearly low-dose CT scan of the lungs is recommended for people who:  Currently smoke.  Have quit within the past 15 years.  Have at least a 30-pack-year history of smoking. A pack year is smoking an average of one pack of cigarettes a day for 1 year.  Yearly screening should continue until it has been 15 years since you quit.  Yearly screening should stop if you develop a health problem that would prevent you from having lung cancer treatment. Breast Cancer  Practice breast self-awareness. This means understanding how your breasts normally appear and feel.  It also means doing regular breast self-exams. Let your health care provider know about any changes, no matter how small.  If you are in your 20s or 30s, you should have a clinical breast exam (CBE) by a health care provider every 1-3 years as part of a regular health exam.  If you are 56 or older, have a CBE every year. Also consider having a breast X-ray (  mammogram) every year.  If you have a family history of breast cancer, talk to your health care provider about genetic screening.  If you are at high risk for breast cancer, talk to your health care provider about having an MRI and a mammogram every year.  Breast cancer gene (BRCA) assessment is recommended for women who have family members with BRCA-related cancers. BRCA-related cancers include:  Breast.  Ovarian.  Tubal.  Peritoneal cancers.  Results of the assessment will determine the need for genetic counseling  and BRCA1 and BRCA2 testing. Cervical Cancer  Your health care provider may recommend that you be screened regularly for cancer of the pelvic organs (ovaries, uterus, and vagina). This screening involves a pelvic examination, including checking for microscopic changes to the surface of your cervix (Pap test). You may be encouraged to have this screening done every 3 years, beginning at age 74.  For women ages 31-65, health care providers may recommend pelvic exams and Pap testing every 3 years, or they may recommend the Pap and pelvic exam, combined with testing for human papilloma virus (HPV), every 5 years. Some types of HPV increase your risk of cervical cancer. Testing for HPV may also be done on women of any age with unclear Pap test results.  Other health care providers may not recommend any screening for nonpregnant women who are considered low risk for pelvic cancer and who do not have symptoms. Ask your health care provider if a screening pelvic exam is right for you.  If you have had past treatment for cervical cancer or a condition that could lead to cancer, you need Pap tests and screening for cancer for at least 20 years after your treatment. If Pap tests have been discontinued, your risk factors (such as having a new sexual partner) need to be reassessed to determine if screening should resume. Some women have medical problems that increase the chance of getting cervical cancer. In these cases, your health care provider may recommend more frequent screening and Pap tests. Colorectal Cancer  This type of cancer can be detected and often prevented.  Routine colorectal cancer screening usually begins at 65 years of age and continues through 65 years of age.  Your health care provider may recommend screening at an earlier age if you have risk factors for colon cancer.  Your health care provider may also recommend using home test kits to check for hidden blood in the stool.  A small  camera at the end of a tube can be used to examine your colon directly (sigmoidoscopy or colonoscopy). This is done to check for the earliest forms of colorectal cancer.  Routine screening usually begins at age 48.  Direct examination of the colon should be repeated every 5-10 years through 65 years of age. However, you may need to be screened more often if early forms of precancerous polyps or small growths are found. Skin Cancer  Check your skin from head to toe regularly.  Tell your health care provider about any new moles or changes in moles, especially if there is a change in a mole's shape or color.  Also tell your health care provider if you have a mole that is larger than the size of a pencil eraser.  Always use sunscreen. Apply sunscreen liberally and repeatedly throughout the day.  Protect yourself by wearing long sleeves, pants, a wide-brimmed hat, and sunglasses whenever you are outside. Heart disease, diabetes, and high blood pressure  High blood pressure causes heart disease  and increases the risk of stroke. High blood pressure is more likely to develop in:  People who have blood pressure in the high end of the normal range (130-139/85-89 mm Hg).  People who are overweight or obese.  People who are African American.  If you are 12-70 years of age, have your blood pressure checked every 3-5 years. If you are 19 years of age or older, have your blood pressure checked every year. You should have your blood pressure measured twice-once when you are at a hospital or clinic, and once when you are not at a hospital or clinic. Record the average of the two measurements. To check your blood pressure when you are not at a hospital or clinic, you can use:  An automated blood pressure machine at a pharmacy.  A home blood pressure monitor.  If you are between 34 years and 93 years old, ask your health care provider if you should take aspirin to prevent strokes.  Have regular  diabetes screenings. This involves taking a blood sample to check your fasting blood sugar level.  If you are at a normal weight and have a low risk for diabetes, have this test once every three years after 65 years of age.  If you are overweight and have a high risk for diabetes, consider being tested at a younger age or more often. Preventing infection Hepatitis B  If you have a higher risk for hepatitis B, you should be screened for this virus. You are considered at high risk for hepatitis B if:  You were born in a country where hepatitis B is common. Ask your health care provider which countries are considered high risk.  Your parents were born in a high-risk country, and you have not been immunized against hepatitis B (hepatitis B vaccine).  You have HIV or AIDS.  You use needles to inject street drugs.  You live with someone who has hepatitis B.  You have had sex with someone who has hepatitis B.  You get hemodialysis treatment.  You take certain medicines for conditions, including cancer, organ transplantation, and autoimmune conditions. Hepatitis C  Blood testing is recommended for:  Everyone born from [birth year] through 1965.  Anyone with known risk factors for hepatitis C. Sexually transmitted infections (STIs)  You should be screened for sexually transmitted infections (STIs) including gonorrhea and chlamydia if:  You are sexually active and are younger than 65 years of age.  You are older than 65 years of age and your health care provider tells you that you are at risk for this type of infection.  Your sexual activity has changed since you were last screened and you are at an increased risk for chlamydia or gonorrhea. Ask your health care provider if you are at risk.  If you do not have HIV, but are at risk, it may be recommended that you take a prescription medicine daily to prevent HIV infection. This is called pre-exposure prophylaxis (PrEP). You are considered at  risk if:  You are sexually active and do not regularly use condoms or know the HIV status of your partner(s).  You take drugs by injection.  You are sexually active with a partner who has HIV. Talk with your health care provider about whether you are at high risk of being infected with HIV. If you choose to begin PrEP, you should first be tested for HIV. You should then be tested every 3 months for as long as you are taking PrEP. Pregnancy  If you are premenopausal and you may become pregnant, ask your health care provider about preconception counseling.  If you may become pregnant, take 400 to 800 micrograms (mcg) of folic acid every day.  If you want to prevent pregnancy, talk to your health care provider about birth control (contraception). Osteoporosis and menopause  Osteoporosis is a disease in which the bones lose minerals and strength with aging. This can result in serious bone fractures. Your risk for osteoporosis can be identified using a bone density scan.  If you are 23 years of age or older, or if you are at risk for osteoporosis and fractures, ask your health care provider if you should be screened.  Ask your health care provider whether you should take a calcium or vitamin D supplement to lower your risk for osteoporosis.  Menopause may have certain physical symptoms and risks.  Hormone replacement therapy may reduce some of these symptoms and risks. Talk to your health care provider about whether hormone replacement therapy is right for you. Follow these instructions at home:  Schedule regular health, dental, and eye exams.  Stay current with your immunizations.  Do not use any tobacco products including cigarettes, chewing tobacco, or electronic cigarettes.  If you are pregnant, do not drink alcohol.  If you are breastfeeding, limit how much and how often you drink alcohol.  Limit alcohol intake to no more than 1 drink per day for nonpregnant women. One drink  equals 12 ounces of beer, 5 ounces of wine, or 1 ounces of hard liquor.  Do not use street drugs.  Do not share needles.  Ask your health care provider for help if you need support or information about quitting drugs.  Tell your health care provider if you often feel depressed.  Tell your health care provider if you have ever been abused or do not feel safe at home. This information is not intended to replace advice given to you by your health care provider. Make sure you discuss any questions you have with your health care provider. Document Released: 01/11/2011 Document Revised: 12/04/2015 Document Reviewed: 04/01/2015  2017 [employer]

## 2016-05-27 NOTE — Assessment & Plan Note (Addendum)
Stable period on sertraline 50mg  daily - pt desires to continue this.

## 2016-05-27 NOTE — Assessment & Plan Note (Signed)
Advanced directive - has at home. Daughter in [place] is [place]CP[place]. Asked to bring us a copy. Does not want prolonged life support. Ok with temporary measures. 

## 2016-05-27 NOTE — Progress Notes (Signed)
BP 118/70   Pulse 60   Temp 98.6 F (37 C) (Oral)   Ht 5' 4.25" (1.632 m)   Wt 185 lb (83.9 kg)   BMI 31.51 kg/m    CC: welcome to medicare visit Subjective:    Patient ID: Yolanda Walsh, female    DOB: 01-25-1951, 65 y.o.   MRN: [id number]  HPI: Yolanda Walsh is a 65 y.o. female presenting on 05/27/2016 for Annual Exam   Passes hearing screen today Vision screen with eye doctor 1 fall with injury- broken ankle (04/2016). Tripped over root at home. No surgery needed. Treated with boot then brace.  Denies depression   Preventative: Colonoscopy ~2005. Pending f/u colonoscopy next month.  Well woman with Dr. Marvel Plan h/o partal hysterectomy. Mammogram - WNL 03/2016 DEXA 03/2015 T -2.4 hip, -1.7 spine - reviewed calicum and vit D intake, rec weight bearing exercise.  Flu - yearly prevnar today Tdap 2012. Shingles - 2013 Advanced directive - has at home. Daughter in [place] is HCPOA. Asked to bring [place] a copy. Does not want prolonged life support. Ok with temporary measures. Seat belt use discussed Sunscreen use discussed. No changing moles on skin. Non smoker Alcohol - none  Lives alone with cat and dog.daughter nearby. Widow, husband deceased from [place]. Occupation: retired, prior worked in [employer], educational Activity: started [employer]  Diet: good water, fruits/vegetables daily  Relevant past medical, surgical, family and social history reviewed and updated as indicated. Interim medical history since our last visit reviewed. Allergies and medications reviewed and updated. Current Outpatient Prescriptions on File Prior to Visit  Medication Sig  . AMBULATORY NON FORMULARY MEDICATION Medication Name: nitroglycerin 0.125% gel to [facility]. apply a pea size amount to your rectum three times daily x 6-8 weeks.  Marland Kitchen aspirin EC 81 MG tablet Take 81 mg by mouth daily as needed.   . Calcium Carb-Cholecalciferol (CALCIUM-VITAMIN D) 600-400 MG-UNIT TABS Take 1 tablet by  mouth daily.  . cetirizine (ZYRTEC) 10 MG tablet Take 10 mg by mouth daily.  . Cholecalciferol (VITAMIN D3) 1000 UNITS CAPS Take 1 capsule (1,000 Units total) by mouth daily.  Marland Kitchen ibuprofen (ADVIL,MOTRIN) 200 MG tablet Take 200 mg by mouth every 6 (six) hours as needed.  . sertraline (ZOLOFT) 50 MG tablet TAKE ONE TABLET BY MOUTH ONCE DAILY  . Na Sulfate-K Sulfate-Mg Sulf (SUPREP BOWEL PREP KIT) 17.5-3.13-1.6 GM/180ML SOLN Suprep-Use as directed (Patient not taking: Reported on 05/27/2016)   No current facility-administered medications on file prior to visit.     Review of Systems Per HPI unless specifically indicated in ROS section     Objective:    BP 118/70   Pulse 60   Temp 98.6 F (37 C) (Oral)   Ht 5' 4.25" (1.632 m)   Wt 185 lb (83.9 kg)   BMI 31.51 kg/m   Wt Readings from Last 3 Encounters:  05/27/16 185 lb (83.9 kg)  05/25/16 184 lb (83.5 kg)  05/10/16 180 lb (81.6 kg)    Physical Exam  Constitutional: She is oriented to person, place, and time. She appears well-developed and well-nourished. No distress.  HENT:  Head: Normocephalic and atraumatic.  Right Ear: Hearing, tympanic membrane, external ear and ear canal normal.  Left Ear: Hearing, tympanic membrane, external ear and ear canal normal.  Nose: Nose normal.  Mouth/Throat: Uvula is midline, oropharynx is clear and moist and mucous membranes are normal. No oropharyngeal exudate, posterior oropharyngeal edema or posterior oropharyngeal erythema.  Eyes: Conjunctivae  and EOM are normal. Pupils are equal, round, and reactive to light. No scleral icterus.  Neck: Normal range of motion. Neck supple. Carotid bruit is not present. No thyromegaly present.  Cardiovascular: Normal rate, regular rhythm, normal heart sounds and intact distal pulses.   No murmur heard. Pulses:      Radial pulses are 2+ on the right side, and 2+ on the left side.  Pulmonary/Chest: Effort normal and breath sounds normal. No respiratory distress.  She has no wheezes. She has no rales.  Abdominal: Soft. Bowel sounds are normal. She exhibits no distension and no mass. There is no tenderness. There is no rebound and no guarding.  Musculoskeletal: Normal range of motion. She exhibits no edema.  Lymphadenopathy:    She has no cervical adenopathy.  Neurological: She is alert and oriented to person, place, and time.  CN grossly intact, station and gait intact Recall 3/3 Calculation 4/5 serial 3s (unable to do serial 7s)  Skin: Skin is warm and dry. No rash noted.  Psychiatric: She has a normal mood and affect. Her behavior is normal. Judgment and thought content normal.  Nursing note and vitals reviewed.  Results for orders placed or performed in visit on 05/20/16  Lipid panel  Result Value Ref Range   Cholesterol 236 (H) 0 - 200 mg/dL   Triglycerides 123.0 0.0 - 149.0 mg/dL   HDL 51.80 >39.00 mg/dL   VLDL 24.6 0.0 - 40.0 mg/dL   LDL Cholesterol 159 (H) 0 - 99 mg/dL   Total CHOL/HDL Ratio 5    NonHDL 010.93   Basic metabolic panel  Result Value Ref Range   Sodium 139 135 - 145 mEq/L   Potassium 3.8 3.5 - 5.1 mEq/L   Chloride 105 96 - 112 mEq/L   CO2 27 19 - 32 mEq/L   Glucose, Bld 100 (H) 70 - 99 mg/dL   BUN 14 6 - 23 mg/dL   Creatinine, Ser 0.85 0.40 - 1.20 mg/dL   Calcium 9.8 8.4 - 10.5 mg/dL   GFR 71.22 >60.00 mL/min   EKG - NSR rate 60, normal axis, intervals, no acute ST/T changes    Assessment & Plan:   Problem List Items Addressed This Visit    Advanced care planning/counseling discussion    Advanced directive - has at home. Daughter in [place] is HCPOA. Asked to bring [place] a copy. Does not want prolonged life support. Ok with temporary measures.      Anxiety    Stable period on sertraline 103m daily - pt desires to continue this.      Hyperlipidemia    Reviewed readings with patient. Pt will work towards healthy diet and lifestyle to improve readings.      Obesity, Class I, BMI 30-34.9    Discussed healthy  diet and lifestyle changes to affect sustainable weight loss.      Osteopenia    Reviewed importance of regular weight bearing exercise as well as calcium/vit D intake      RECTAL FISSURE    Appreciate GI care of patient.      Welcome to Medicare preventive visit - Primary    I have personally reviewed the Medicare Annual Wellness questionnaire and have noted 1. The patient's medical and social history 2. Their use of alcohol, tobacco or illicit drugs 3. Their current medications and supplements 4. The patient's functional ability including ADL's, fall risks, home safety risks and hearing or visual impairment. Cognitive function has been assessed and addressed as indicated.  5. Diet and physical activity 6. Evidence for depression or mood disorders The patients weight, height, BMI have been recorded in the chart. I have made referrals, counseling and provided education to the patient based on review of the above and I have provided the pt with a written personalized care plan for preventive services. Provider list updated.. See scanned questionairre as needed for further documentation. Reviewed preventative protocols and updated unless pt declined.       Relevant Orders   EKG 12-Lead (Completed)       Follow up plan: Return in about 1 year (around 05/27/2017) for medicare wellness visit.  Ria Bush, MD

## 2016-05-27 NOTE — Assessment & Plan Note (Signed)
Reviewed importance of regular weight bearing exercise as well as calcium/vit D intake

## 2016-05-27 NOTE — Addendum Note (Signed)
Addended by: Royann Shivers A on: 05/27/2016 11:40 AM   Modules accepted: Orders

## 2016-05-31 ENCOUNTER — Ambulatory Visit (INDEPENDENT_AMBULATORY_CARE_PROVIDER_SITE_OTHER): Payer: PPO | Admitting: Sports Medicine

## 2016-05-31 VITALS — BP 140/79 | HR 57 | Ht 64.0 in | Wt 185.0 lb

## 2016-05-31 DIAGNOSIS — M25571 Pain in right ankle and joints of right foot: Secondary | ICD-10-CM

## 2016-05-31 NOTE — Progress Notes (Signed)
Yolanda Walsh - 65 y.o. female MRN [id number]  Date of birth: 01-21-1951  Office Visit Note: Visit Date: 05/31/2016 PCP: Ria Bush, MD Referred by: Ria Bush, MD  Subjective: Chief Complaint  Patient presents with  . Right Ankle - Follow-up    3  Wk ROV Rt Ankle.  Doing well. Just a little sore. Cont use of ASO   HPI: Overall doing significantly better. Reports some posterior medial ankle pain. She's been using the lace up ankle brace but feels as though this is cumbersome for her. She has not been using any type of cushioned insole. She has had prior custom insoles fabricated in the past but has not been using these for several years. ROS: Otherwise per HPI.   Clinical History: No specialty comments available.  She reports that she has never smoked. She has never used smokeless tobacco.  No results for input(s): HGBA1C, LABURIC in the last 8760 hours.  Assessment & Plan: Visit Diagnoses:  1. Acute right ankle pain    Plan: Moderate cavus foot with minimal tenderness over the posterior tibialis tendon. Try using topical Aspercreme. SOL E insoles. Follow-up: Return if symptoms worsen or fail to improve.  Meds: No orders of the defined types were placed in this encounter.  Procedures: No notes on file   Objective:  VS:  HT:5\' 4"  (162.6 cm)   WT:185 lb (83.9 kg)  BMI:31.8    BP:140/79  HR:(!) 57bpm  TEMP: ( )  RESP:  Physical Exam: Adult female. No acute distress. Alert & appropriate. RIGHT ankle: Well aligned. No significant joint effusion. Tenderness along the posterior tibialis tendon. DP & PT pulses 2+/4. Sensation intact to light touch. Ankle drawer test is normal. Inversion & eversion strength is 2+/4. No focal bony tenderness.  Imaging: No results found.   Past Medical/Family/Surgical/Social History: Medications & Allergies reviewed per EMR Patient Active Problem List   Diagnosis Date Noted  . Welcome to Medicare preventive visit 05/27/2016  .  Advanced care planning/counseling discussion 05/27/2016  . Obesity, Class I, BMI 30-34.9 05/27/2016  . Tinnitus 01/29/2013  . Healthcare maintenance 04/24/2012  . Hyperlipidemia 04/24/2012  . Groin pain 03/23/2012  . RECTAL FISSURE 03/25/2008  . Osteopenia 01/17/2008  . Atypical chest pain 01/17/2008  . Anxiety 01/17/2008   Past Medical History:  Diagnosis Date  . History of anal fissures 2013   worsened by constipation   . History of hemorrhoids    w/o complications  . History of rectal fissure   . Hyperlipidemia 04/24/2012  . Lichen sclerosus    temovate cream  . Medial meniscus tear   . Osteopenia    dexa 2013, 2016  . Trigger finger    Family History  Problem Relation Age of Onset  . Coronary artery disease Brother 40    7 stents  . Cancer Mother     leukemia  . Coronary artery disease Mother 23  . Coronary artery disease Father 36    MI  . Parkinsonism Brother   . Hypertension Brother   . Hypertension Sister   . Crohn's disease Sister   . Diabetes Maternal Grandfather    Past Surgical History:  Procedure Laterality Date  . COLONOSCOPY  09/2003  . dexa  04/2012   T -2.1 hip, -1.2 spine  . dexa  03/2015   T -2.4 hip, -1.7 spine  . I&D EXTREMITY Right 03/16/2014   cat bite hand s/p I&D in OR ([name])  . left hand surgery  1962  tendon laceration  . PARTIAL HYSTERECTOMY  1980   for prolapse, ovaries remain   Social History   Occupational History  . Not on file.   Social History Main Topics  . Smoking status: Never Smoker  . Smokeless tobacco: Never Used  . Alcohol use Yes     Comment: rare  . Drug use: No  . Sexual activity: Not on file

## 2016-05-31 NOTE — Patient Instructions (Signed)
Fleet [employer]et on [place] has SOLE cushioned insoles.  You need the medium cushioned insole.   Keep working on [employer] of motion exercises

## 2016-06-09 ENCOUNTER — Ambulatory Visit (INDEPENDENT_AMBULATORY_CARE_PROVIDER_SITE_OTHER): Payer: PPO | Admitting: Family Medicine

## 2016-06-09 ENCOUNTER — Encounter: Payer: Self-pay | Admitting: Family Medicine

## 2016-06-09 VITALS — BP 114/66 | HR 65 | Temp 98.7°F | Wt 184.8 lb

## 2016-06-09 DIAGNOSIS — B9789 Other viral agents as the cause of diseases classified elsewhere: Secondary | ICD-10-CM | POA: Diagnosis not present

## 2016-06-09 DIAGNOSIS — J069 Acute upper respiratory infection, unspecified: Secondary | ICD-10-CM

## 2016-06-09 MED ORDER — AMOXICILLIN 875 MG PO TABS: 875.0000 mg | ORAL_TABLET | Freq: Two times a day (BID) | ORAL | 0 refills | Status: DC

## 2016-06-09 MED ORDER — LIDOCAINE VISCOUS 2 % MT SOLN: 5.0000 mL | OROMUCOSAL | 0 refills | Status: DC | PRN

## 2016-06-09 NOTE — Progress Notes (Signed)
Pre visit review using our clinic review tool, if applicable. No additional management support is needed unless otherwise documented below in the visit note. 

## 2016-06-09 NOTE — Progress Notes (Signed)
Subjective:    Patient ID: Yolanda Walsh, female    DOB: 01-May-1951, 65 y.o.   MRN: [id number]  HPI This is a 65 yo female who presents today with 6 days of runny nose, sinus headache, sinus congestion, sore throat and cough. No fever. No sputum or SOB. Has been taking Mucinex D every 12 hours with some relief, ibuprofen x 1 with little relief. No known sick contacts. Some aches.   Past Medical History:  Diagnosis Date  . History of anal fissures 2013   worsened by constipation   . History of hemorrhoids    w/o complications  . History of rectal fissure   . Hyperlipidemia 04/24/2012  . Lichen sclerosus    temovate cream  . Medial meniscus tear   . Osteopenia    dexa 2013, 2016  . Trigger finger    Past Surgical History:  Procedure Laterality Date  . COLONOSCOPY  09/2003  . dexa  04/2012   T -2.1 hip, -1.2 spine  . dexa  03/2015   T -2.4 hip, -1.7 spine  . I&D EXTREMITY Right 03/16/2014   cat bite hand s/p I&D in OR ([name])  . left hand surgery  1962   tendon laceration  . PARTIAL HYSTERECTOMY  1980   for prolapse, ovaries remain   Family History  Problem Relation Age of Onset  . Coronary artery disease Brother 40    7 stents  . Cancer Mother     leukemia  . Coronary artery disease Mother 52  . Coronary artery disease Father 43    MI  . Parkinsonism Brother   . Hypertension Brother   . Hypertension Sister   . Crohn's disease Sister   . Diabetes Maternal Grandfather    Social History  Substance Use Topics  . Smoking status: Never Smoker  . Smokeless tobacco: Never Used  . Alcohol use Yes     Comment: rare      Review of Systems Per HPI    Objective:   Physical Exam  Constitutional: She is oriented to person, place, and time. She appears well-developed and well-nourished. No distress.  HENT:  Head: Normocephalic and atraumatic.  Right Ear: Tympanic membrane, external ear and ear canal normal.  Left Ear: Tympanic membrane, external ear and ear canal  normal.  Nose: Mucosal edema and rhinorrhea present. Right sinus exhibits maxillary sinus tenderness. Right sinus exhibits no frontal sinus tenderness. Left sinus exhibits maxillary sinus tenderness. Left sinus exhibits no frontal sinus tenderness.  Mouth/Throat: Uvula is midline. No oropharyngeal exudate or posterior oropharyngeal edema.  Few resolving ulcerated areas at back of throat.   Eyes: Conjunctivae are normal.  Cardiovascular: Normal rate, regular rhythm and normal heart sounds.   Pulmonary/Chest: Effort normal and breath sounds normal.  Lymphadenopathy:    She has no cervical adenopathy.  Neurological: She is alert and oriented to person, place, and time.  Skin: Skin is warm. She is not diaphoretic.  Psychiatric: She has a normal mood and affect. Her behavior is normal. Judgment and thought content normal.  Vitals reviewed.        BP 114/66   Pulse 65   Temp 98.7 F (37.1 C) (Oral)   Wt 184 lb 12 oz (83.8 kg)   SpO2 98%   BMI 31.71 kg/m  Wt Readings from Last 3 Encounters:  06/09/16 184 lb 12 oz (83.8 kg)  05/31/16 185 lb (83.9 kg)  05/27/16 185 lb (83.9 kg)     Assessment & Plan:  1. Viral URI with cough - likely viral, provided written and verbal instructions for symptomatic treatment including continuing Mucinex D, add Afrin nasal spray for max 3 days, saline nasal spray, otc analgesics every 8 hours prn - if no improvement in 5 days, start antibiotic - RTC precautions reviewed - lidocaine (XYLOCAINE) 2 % solution; Use as directed 5 mLs in the mouth or throat every 3 (three) hours as needed for mouth pain.  Dispense: 100 mL; Refill: 0 - amoxicillin (AMOXIL) 875 MG tablet; Take 1 tablet (875 mg total) by mouth 2 (two) times daily.  Dispense: 14 tablet; Refill: 0   Clarene Reamer, FNP-BC  [facility]  06/09/2016 9:02 AM

## 2016-06-09 NOTE — Patient Instructions (Addendum)
For nasal congestion you can use Afrin nasal spray for 3 days max, continue Mucinex D, saline nasal spray (generic is fine for all). For cough you can try Delsym. Drink enough fluids to make your urine light yellow. For fever/chill/muscle aches you can take over the counter acetaminophen or ibuprofen every 8 hours.  If not better by Monday, please start antibiotic Please come back in if you are not better in 5-7 days or if you develop wheezing, shortness of breath or persistent vomiting.

## 2016-07-12 HISTORY — PX: COLONOSCOPY: SHX174

## 2016-07-20 ENCOUNTER — Encounter: Payer: Self-pay | Admitting: Internal Medicine

## 2016-07-26 ENCOUNTER — Encounter: Payer: Self-pay | Admitting: Internal Medicine

## 2016-07-26 ENCOUNTER — Ambulatory Visit (AMBULATORY_SURGERY_CENTER): Payer: PPO | Admitting: Internal Medicine

## 2016-07-26 VITALS — BP 115/70 | HR 57 | Temp 96.9°F | Resp 14 | Ht 64.0 in | Wt 184.0 lb

## 2016-07-26 DIAGNOSIS — E669 Obesity, unspecified: Secondary | ICD-10-CM | POA: Diagnosis not present

## 2016-07-26 DIAGNOSIS — Z1212 Encounter for screening for malignant neoplasm of rectum: Secondary | ICD-10-CM

## 2016-07-26 DIAGNOSIS — K635 Polyp of colon: Secondary | ICD-10-CM

## 2016-07-26 DIAGNOSIS — D125 Benign neoplasm of sigmoid colon: Secondary | ICD-10-CM

## 2016-07-26 DIAGNOSIS — D120 Benign neoplasm of cecum: Secondary | ICD-10-CM

## 2016-07-26 DIAGNOSIS — K602 Anal fissure, unspecified: Secondary | ICD-10-CM | POA: Diagnosis not present

## 2016-07-26 DIAGNOSIS — Z1211 Encounter for screening for malignant neoplasm of colon: Secondary | ICD-10-CM

## 2016-07-26 MED ORDER — SODIUM CHLORIDE 0.9 % IV SOLN: 500.0000 mL | INTRAVENOUS | Status: DC

## 2016-07-26 NOTE — Progress Notes (Signed)
To recovery, report to Sechler, RN, VSS 

## 2016-07-26 NOTE — Op Note (Signed)
[facility] Patient Name: Reno Fong Procedure Date: 07/26/2016 2:33 PM MRN: [id number] Endoscopist: Docia Chuck. Henrene Pastor , MD Age: 66 Referring MD:  Date of Birth: 1951/03/04 Gender: Female Account #: [id number] Procedure:                Colonoscopy, with cold snare polypectomy x 2 Indications:              Screening for colorectal malignant neoplasm Medicines:                Monitored Anesthesia Care Procedure:                Pre-Anesthesia Assessment:                           - Prior to the procedure, a History and Physical                            was performed, and patient medications and                            allergies were reviewed. The patient's tolerance of                            previous anesthesia was also reviewed. The risks                            and benefits of the procedure and the sedation                            options and risks were discussed with the patient.                            All questions were answered, and informed consent                            was obtained. Prior Anticoagulants: The patient has                            taken no previous anticoagulant or antiplatelet                            agents. ASA Grade Assessment: II - A patient with                            mild systemic disease. After reviewing the risks                            and benefits, the patient was deemed in                            satisfactory condition to undergo the procedure.                           After obtaining informed consent, the colonoscope  was passed under direct vision. Throughout the                            procedure, the patient's blood pressure, pulse, and                            oxygen saturations were monitored continuously. The                            Colonoscope was introduced through the anus and                            advanced to the the cecum, identified by       appendiceal orifice and ileocecal valve. The                            ileocecal valve, appendiceal orifice, and rectum                            were photographed. The quality of the bowel                            preparation was excellent. The colonoscopy was                            performed without difficulty. The patient tolerated                            the procedure well. The bowel preparation used was                            SUPREP. Scope In: 2:53:14 PM Scope Out: 3:07:21 PM Scope Withdrawal Time: 0 hours 10 minutes 9 seconds  Total Procedure Duration: 0 hours 14 minutes 7 seconds  Findings:                 Two polyps were found in the sigmoid colon and                            cecum. The polyps were 3 mm in size. These polyps                            were removed with a cold snare. Resection and                            retrieval were complete.                           Multiple diverticula were found in the sigmoid                            colon.                           Internal hemorrhoids were found during retroflexion.  The exam was otherwise without abnormality on                            direct and retroflexion views. Complications:            No immediate complications. Estimated blood loss:                            None. Estimated Blood Loss:     Estimated blood loss: none. Impression:               - Two 3 mm polyps in the sigmoid colon and in the                            cecum, removed with a cold snare. Resected and                            retrieved.                           - Diverticulosis in the sigmoid colon.                           - Internal hemorrhoids.                           - The examination was otherwise normal on direct                            and retroflexion views. Recommendation:           - Repeat colonoscopy in 5-10 years for surveillance.                           - Patient has a  contact number available for                            emergencies. The signs and symptoms of potential                            delayed complications were discussed with the                            patient. Return to normal activities tomorrow.                            Written discharge instructions were provided to the                            patient.                           - Resume previous diet.                           - Continue present medications.                           -  Await pathology results. Docia Chuck. Henrene Pastor, MD 07/26/2016 3:11:56 PM This report has been signed electronically.

## 2016-07-26 NOTE — Patient Instructions (Signed)
Impression/Recommendations:  Polyp handout given to patient. Diverticulosis handout given to patient. Hemorrhoid handout given to patient.  Repeat colonoscopy in 5-10 years for surveillance.  YOU HAD AN ENDOSCOPIC PROCEDURE TODAY AT [facility]:   Refer to the procedure report that was given to you for any specific questions about what was found during the examination.  If the procedure report does not answer your questions, please call your gastroenterologist to clarify.  If you requested that your care partner not be given the details of your procedure findings, then the procedure report has been included in a sealed envelope for you to review at your convenience later.  YOU SHOULD EXPECT: Some feelings of bloating in the abdomen. Passage of more gas than usual.  Walking can help get rid of the air that was put into your GI tract during the procedure and reduce the bloating. If you had a lower endoscopy (such as a colonoscopy or flexible sigmoidoscopy) you may notice spotting of blood in your stool or on the toilet paper. If you underwent a bowel prep for your procedure, you may not have a normal bowel movement for a few days.  Please Note:  You might notice some irritation and congestion in your nose or some drainage.  This is from the oxygen used during your procedure.  There is no need for concern and it should clear up in a day or so.  SYMPTOMS TO REPORT IMMEDIATELY:   Following lower endoscopy (colonoscopy or flexible sigmoidoscopy):  Excessive amounts of blood in the stool  Significant tenderness or worsening of abdominal pains  Swelling of the abdomen that is new, acute  Fever of 100F or higher For urgent or emergent issues, a gastroenterologist can be reached at any hour by calling [phone].   DIET:  We do recommend a small meal at first, but then you may proceed to your regular diet.  Drink plenty of fluids but you should avoid alcoholic beverages for 24  hours.  ACTIVITY:  You should plan to take it easy for the rest of today and you should NOT DRIVE or use heavy machinery until tomorrow (because of the sedation medicines used during the test).    FOLLOW UP: Our staff will call the number listed on your records the next business day following your procedure to check on you and address any questions or concerns that you may have regarding the information given to you following your procedure. If we do not reach you, we will leave a message.  However, if you are feeling well and you are not experiencing any problems, there is no need to return our call.  We will assume that you have returned to your regular daily activities without incident.  If any biopsies were taken you will be contacted by phone or by letter within the next 1-3 weeks.  Please call us at [phone] if you have not heard about the biopsies in 3 weeks.    SIGNATURES/CONFIDENTIALITY: You and/or your care partner have signed paperwork which will be entered into your electronic medical record.  These signatures attest to the fact that that the information above on your After Visit Summary has been reviewed and is understood.  Full responsibility of the confidentiality of this discharge information lies with you and/or your care-partner.

## 2016-07-26 NOTE — Progress Notes (Signed)
Called to room to assist during endoscopic procedure.  Patient ID and intended procedure confirmed with present staff. Received instructions for my participation in the procedure from the performing physician.  

## 2016-07-27 ENCOUNTER — Telehealth: Payer: Self-pay

## 2016-07-27 NOTE — Telephone Encounter (Signed)
  Follow up Call-  Call back number 07/26/2016  Post procedure Call Back phone  # [phone]  Permission to leave phone message Yes  Some recent data might be hidden     Patient questions:  Do you have a fever, pain , or abdominal swelling? No. Pain Score  0 *  Have you tolerated food without any problems? Yes.    Have you been able to return to your normal activities? Yes.    Do you have any questions about your discharge instructions: Diet   No. Medications  No. Follow up visit  No.  Do you have questions or concerns about your Care? No.  Actions: * If pain score is 4 or above: No action needed, pain <4.

## 2016-08-02 ENCOUNTER — Encounter: Payer: Self-pay | Admitting: Internal Medicine

## 2016-08-07 ENCOUNTER — Encounter: Payer: Self-pay | Admitting: Family Medicine

## 2016-08-10 DIAGNOSIS — Z6832 Body mass index (BMI) 32.0-32.9, adult: Secondary | ICD-10-CM | POA: Diagnosis not present

## 2016-08-10 DIAGNOSIS — L900 Lichen sclerosus et atrophicus: Secondary | ICD-10-CM | POA: Diagnosis not present

## 2016-08-10 DIAGNOSIS — N816 Rectocele: Secondary | ICD-10-CM | POA: Diagnosis not present

## 2016-08-10 DIAGNOSIS — Z01419 Encounter for gynecological examination (general) (routine) without abnormal findings: Secondary | ICD-10-CM | POA: Diagnosis not present

## 2016-08-10 DIAGNOSIS — Z1389 Encounter for screening for other disorder: Secondary | ICD-10-CM | POA: Diagnosis not present

## 2016-08-12 ENCOUNTER — Other Ambulatory Visit: Payer: Self-pay | Admitting: Family Medicine

## 2017-01-10 DIAGNOSIS — S0502XA Injury of conjunctiva and corneal abrasion without foreign body, left eye, initial encounter: Secondary | ICD-10-CM | POA: Diagnosis not present

## 2017-03-11 NOTE — Progress Notes (Signed)
Chief Complaint  Patient presents with  . Abdominal Pain    RLQ--- hip area x 1 month... soreness... denies urinary symptoms.... denies nausea, vomiting or diarrhea--- regular BM....pt has tried OTC ibuprofen and epsom salt soaks with minimal relief    HPI: Yolanda Walsh 66 y.o. Patient comes in today for [facility] Saturday clinic for  new problem evaluation. After pushes couches a month ago may have starined something but not getting better    and advil dosesnt do anything    Baths and restricting self.    Local right side . ? Cause   Hurst to lay on that side to sleep  And after walking a lot    Dull soreness    Not sever but not getting better .  No fever utis hematuria abd pain  But needs to travel inSeptember and wants to be sure not other problem and what to do  No recent fall injury  Taking care of sis who had back surgery .  No groin pain abd pain   ROS: See pertinent positives and negatives per HPI.  Past Medical History:  Diagnosis Date  . Ankle fracture    right  . Depression   . History of anal fissures 2013   worsened by constipation   . History of hemorrhoids    w/o complications  . History of rectal fissure   . Hyperlipidemia 04/24/2012  . Lichen sclerosus    temovate cream  . Medial meniscus tear   . Osteopenia    dexa 2013, 2016  . Trigger finger     Family History  Problem Relation Age of Onset  . Coronary artery disease Brother 40       7 stents  . Cancer Mother        leukemia  . Coronary artery disease Mother 68  . Coronary artery disease Father 35       MI  . Parkinsonism Brother   . Hypertension Brother   . Hypertension Sister   . Crohn's disease Sister   . Diabetes Maternal Grandfather     Social History   Social History  . Marital status: Divorced    Spouse name: N/A  . Number of children: N/A  . Years of education: N/A   Social History Main Topics  . Smoking status: Never Smoker  . Smokeless tobacco: Never Used  . Alcohol use  Yes     Comment: rare  . Drug use: No  . Sexual activity: Not Asked   Other Topics Concern  . None   Social History Narrative   Lives alone with cat and dog.  Widow, husband deceased from [place].   Occupation: retired, prior worked in [employer], educational   Activity: no regular exercise, has eliptical   Diet: good water, fruits/vegetables daily    Outpatient Medications Prior to Visit  Medication Sig Dispense Refill  . AMBULATORY NON FORMULARY MEDICATION Medication Name: nitroglycerin 0.125% gel to [facility]. apply a pea size amount to your rectum three times daily x 6-8 weeks. 30 g 0  . aspirin EC 81 MG tablet Take 81 mg by mouth daily as needed.     . Calcium Carb-Cholecalciferol (CALCIUM-VITAMIN D) 600-400 MG-UNIT TABS Take 1 tablet by mouth daily.    . cetirizine (ZYRTEC) 10 MG tablet Take 10 mg by mouth daily.    . Cholecalciferol (VITAMIN D3) 1000 UNITS CAPS Take 1 capsule (1,000 Units total) by mouth daily. 30 capsule   . ibuprofen (ADVIL,MOTRIN) 200  MG tablet Take 200 mg by mouth every 6 (six) hours as needed.    . sertraline (ZOLOFT) 50 MG tablet TAKE ONE TABLET BY MOUTH ONCE DAILY 90 tablet 2   Facility-Administered Medications Prior to Visit  Medication Dose Route Frequency Provider Last Rate Last Dose  . 0.9 %  sodium chloride infusion  500 mL Intravenous Continuous Irene Shipper, MD         EXAM:  BP 118/78   Pulse (!) 59   Temp 97.9 F (36.6 C) (Oral)   Wt 192 lb 8 oz (87.3 kg)   SpO2 98%   BMI 33.04 kg/m   Body mass index is 33.04 kg/m.  GENERAL: vitals reviewed and listed above, alert, oriented, appears well hydrated and in no acute distress HEENT: atraumatic, conjunctiva  clear, no obvious abnormalities on inspection of external nose and ears NECK: no obvious masses on inspection palpation  CV: HRRR, no clubbing cyanosis or  peripheral edema nl cap refill  Abdomen:  Sof,t normal bowel sounds without hepatosplenomegaly, no guarding rebound or masses no  CVA tenderness points to right lateral hip and ischial area   rom hip ok but walks with a limp  No rash   MS: moves all extremities without noticeable focal  Abnormality otherwise  PSYCH: pleasant and cooperative, no obvious depression or anxiety  Wt Readings from Last 3 Encounters:  03/12/17 192 lb 8 oz (87.3 kg)  07/26/16 184 lb (83.5 kg)  06/09/16 184 lb 12 oz (83.8 kg)   BP Readings from Last 3 Encounters:  03/12/17 118/78  07/26/16 115/70  06/09/16 114/66    ASSESSMENT AND PLAN:  Discussed the following assessment and plan:  Bursitis of right hip, unspecified bursa  Right lower quadrant abdominal pain - Plan: POCT urinalysis dipstick No hs of bleeding ulcer gi etc    Short term Voltaren and  Fu pcp  Other rx optinos discussed   Doubt   abd pathology based on context and exam  -Patient advised to return or notify health care team  if symptoms worsen ,persist or new concerns arise.  Patient Instructions  Thinking this is hip bursitis      Urine is clear .    Try different antiinflammatory  For the next 7-10 days  And if not getting better fu with PCP .      Hip Bursitis Hip bursitis is inflammation of a fluid-filled sac (bursa) in the hip joint. The bursa protects the bones in the hip joint from rubbing against each other. Hip bursitis can cause mild to moderate pain, and symptoms often come and go over time. What are the causes? This condition may be caused by:  Injury to the hip.  Overuse of the muscles that surround the hip joint.  Arthritis or gout.  Diabetes.  Thyroid disease.  Cold weather.  Infection.  In some cases, the cause may not be known. What are the signs or symptoms? Symptoms of this condition may include:  Mild or moderate pain in the hip area. Pain may get worse with movement.  Tenderness and swelling of the hip, especially on the outer side of the hip.  Symptoms may come and go. If the bursa becomes infected, you may have the  following symptoms:  Fever.  Red skin and a feeling of warmth in the hip area.  How is this diagnosed? This condition may be diagnosed based on:  A physical exam.  Your medical history.  X-rays.  Removal of fluid from  your inflamed bursa for testing (biopsy).  You may be sent to a health care provider who specializes in bone diseases (orthopedist) or a provider who specializes in joint inflammation (rheumatologist). How is this treated? This condition is treated by resting, raising (elevating), and applying pressure(compression) to the injured area. In some cases, this may be enough to make your symptoms go away. Treatment may also include:  Crutches.  Antibiotic medicine.  Draining fluid out of the bursa to help relieve swelling.  Injecting medicine that helps to reduce inflammation (cortisone).  Follow these instructions at home: Medicines  Take over-the-counter and prescription medicines only as told by your health care provider.  Do not drive or operate heavy machinery while taking prescription pain medicine, or as told by your health care provider.  If you were prescribed an antibiotic, take it as told by your health care provider. Do not stop taking the antibiotic even if you start to feel better. Activity  Return to your normal activities as told by your health care provider. Ask your health care provider what activities are safe for you.  Rest and protect your hip as much as possible until your pain and swelling get better. General instructions  Wear compression wraps only as told by your health care provider.  Elevate your hip above the level of your heart as much as you can without pain. To do this, try putting a pillow under your hips while you lie down.  Do not use your hip to support your body weight until your health care provider says that you can. Use crutches as told by your health care provider.  Gently massage and stretch your injured area as  often as is comfortable.  Keep all follow-up visits as told by your health care provider. This is important. How is this prevented?  Exercise regularly, as told by your health care provider.  Warm up and stretch before being active.  Cool down and stretch after being active.  If an activity irritates your hip or causes pain, avoid the activity as much as possible.  Avoid sitting down for long periods at a time. Contact a health care provider if:  You have a fever.  You develop new symptoms.  You have difficulty walking or doing everyday activities.  You have pain that gets worse or does not get better with medicine.  You develop red skin or a feeling of warmth in your hip area. Get help right away if:  You cannot move your hip.  You have severe pain. This information is not intended to replace advice given to you by your health care provider. Make sure you discuss any questions you have with your health care provider. Document Released: 12/18/2001 Document Revised: 12/04/2015 Document Reviewed: 01/28/2015 Elsevier Interactive Patient Education  2018 [place] Panosh M.D.

## 2017-03-12 ENCOUNTER — Ambulatory Visit (INDEPENDENT_AMBULATORY_CARE_PROVIDER_SITE_OTHER): Payer: PPO | Admitting: Internal Medicine

## 2017-03-12 ENCOUNTER — Encounter: Payer: Self-pay | Admitting: Internal Medicine

## 2017-03-12 VITALS — BP 118/78 | HR 59 | Temp 97.9°F | Wt 192.5 lb

## 2017-03-12 DIAGNOSIS — M7071 Other bursitis of hip, right hip: Secondary | ICD-10-CM

## 2017-03-12 DIAGNOSIS — R1031 Right lower quadrant pain: Secondary | ICD-10-CM

## 2017-03-12 LAB — POCT URINALYSIS DIPSTICK
BILIRUBIN UA: NEGATIVE
Glucose, UA: NEGATIVE
Ketones, UA: NEGATIVE
Leukocytes, UA: NEGATIVE
NITRITE UA: NEGATIVE
PH UA: 6 (ref 5.0–8.0)
PROTEIN UA: NEGATIVE
RBC UA: NEGATIVE
Spec Grav, UA: 1.01 (ref 1.010–1.025)
Urobilinogen, UA: 0.2 E.U./dL

## 2017-03-12 MED ORDER — DICLOFENAC SODIUM 75 MG PO TBEC: 75.0000 mg | DELAYED_RELEASE_TABLET | Freq: Two times a day (BID) | ORAL | 0 refills | Status: DC

## 2017-03-12 NOTE — Patient Instructions (Signed)
Thinking this is hip bursitis      Urine is clear .    Try different antiinflammatory  For the next 7-10 days  And if not getting better fu with PCP .      Hip Bursitis Hip bursitis is inflammation of a fluid-filled sac (bursa) in the hip joint. The bursa protects the bones in the hip joint from rubbing against each other. Hip bursitis can cause mild to moderate pain, and symptoms often come and go over time. What are the causes? This condition may be caused by:  Injury to the hip.  Overuse of the muscles that surround the hip joint.  Arthritis or gout.  Diabetes.  Thyroid disease.  Cold weather.  Infection.  In some cases, the cause may not be known. What are the signs or symptoms? Symptoms of this condition may include:  Mild or moderate pain in the hip area. Pain may get worse with movement.  Tenderness and swelling of the hip, especially on the outer side of the hip.  Symptoms may come and go. If the bursa becomes infected, you may have the following symptoms:  Fever.  Red skin and a feeling of warmth in the hip area.  How is this diagnosed? This condition may be diagnosed based on:  A physical exam.  Your medical history.  X-rays.  Removal of fluid from your inflamed bursa for testing (biopsy).  You may be sent to a health care provider who specializes in bone diseases (orthopedist) or a provider who specializes in joint inflammation (rheumatologist). How is this treated? This condition is treated by resting, raising (elevating), and applying pressure(compression) to the injured area. In some cases, this may be enough to make your symptoms go away. Treatment may also include:  Crutches.  Antibiotic medicine.  Draining fluid out of the bursa to help relieve swelling.  Injecting medicine that helps to reduce inflammation (cortisone).  Follow these instructions at home: Medicines  Take over-the-counter and prescription medicines only as told by  your health care provider.  Do not drive or operate heavy machinery while taking prescription pain medicine, or as told by your health care provider.  If you were prescribed an antibiotic, take it as told by your health care provider. Do not stop taking the antibiotic even if you start to feel better. Activity  Return to your normal activities as told by your health care provider. Ask your health care provider what activities are safe for you.  Rest and protect your hip as much as possible until your pain and swelling get better. General instructions  Wear compression wraps only as told by your health care provider.  Elevate your hip above the level of your heart as much as you can without pain. To do this, try putting a pillow under your hips while you lie down.  Do not use your hip to support your body weight until your health care provider says that you can. Use crutches as told by your health care provider.  Gently massage and stretch your injured area as often as is comfortable.  Keep all follow-up visits as told by your health care provider. This is important. How is this prevented?  Exercise regularly, as told by your health care provider.  Warm up and stretch before being active.  Cool down and stretch after being active.  If an activity irritates your hip or causes pain, avoid the activity as much as possible.  Avoid sitting down for long periods at a time.  Contact a health care provider if:  You have a fever.  You develop new symptoms.  You have difficulty walking or doing everyday activities.  You have pain that gets worse or does not get better with medicine.  You develop red skin or a feeling of warmth in your hip area. Get help right away if:  You cannot move your hip.  You have severe pain. This information is not intended to replace advice given to you by your health care provider. Make sure you discuss any questions you have with your health care  provider. Document Released: 12/18/2001 Document Revised: 12/04/2015 Document Reviewed: 01/28/2015 Elsevier Interactive Patient Education  [employer].  .

## 2017-04-08 ENCOUNTER — Ambulatory Visit (INDEPENDENT_AMBULATORY_CARE_PROVIDER_SITE_OTHER): Payer: PPO

## 2017-04-08 DIAGNOSIS — Z23 Encounter for immunization: Secondary | ICD-10-CM | POA: Diagnosis not present

## 2017-04-11 DIAGNOSIS — M8589 Other specified disorders of bone density and structure, multiple sites: Secondary | ICD-10-CM | POA: Diagnosis not present

## 2017-04-11 DIAGNOSIS — Z1231 Encounter for screening mammogram for malignant neoplasm of breast: Secondary | ICD-10-CM | POA: Diagnosis not present

## 2017-04-12 ENCOUNTER — Encounter: Payer: Self-pay | Admitting: Family Medicine

## 2017-04-12 LAB — HM MAMMOGRAPHY

## 2017-04-13 ENCOUNTER — Encounter: Payer: Self-pay | Admitting: Family Medicine

## 2017-05-31 DIAGNOSIS — H2513 Age-related nuclear cataract, bilateral: Secondary | ICD-10-CM | POA: Diagnosis not present

## 2017-05-31 DIAGNOSIS — H43813 Vitreous degeneration, bilateral: Secondary | ICD-10-CM | POA: Diagnosis not present

## 2017-06-01 ENCOUNTER — Other Ambulatory Visit: Payer: Self-pay | Admitting: Family Medicine

## 2017-06-01 ENCOUNTER — Ambulatory Visit (INDEPENDENT_AMBULATORY_CARE_PROVIDER_SITE_OTHER): Payer: PPO

## 2017-06-01 VITALS — BP 104/70 | HR 63 | Temp 97.9°F | Ht 64.25 in | Wt 187.0 lb

## 2017-06-01 DIAGNOSIS — Z23 Encounter for immunization: Secondary | ICD-10-CM | POA: Diagnosis not present

## 2017-06-01 DIAGNOSIS — Z0000 Encounter for general adult medical examination without abnormal findings: Secondary | ICD-10-CM | POA: Diagnosis not present

## 2017-06-01 DIAGNOSIS — E785 Hyperlipidemia, unspecified: Secondary | ICD-10-CM

## 2017-06-01 DIAGNOSIS — E669 Obesity, unspecified: Secondary | ICD-10-CM

## 2017-06-01 LAB — BASIC METABOLIC PANEL
BUN: 15 mg/dL (ref 6–23)
CALCIUM: 9.8 mg/dL (ref 8.4–10.5)
CO2: 28 meq/L (ref 19–32)
CREATININE: 0.82 mg/dL (ref 0.40–1.20)
Chloride: 103 mEq/L (ref 96–112)
GFR: 74 mL/min (ref >60.00)
Glucose, Bld: 103 mg/dL — ABNORMAL HIGH (ref 70–99)
Potassium: 5.2 mEq/L — ABNORMAL HIGH (ref 3.5–5.1)
SODIUM: 137 meq/L (ref 135–145)

## 2017-06-01 LAB — TSH: TSH: 2.37 u[IU]/mL (ref 0.35–4.50)

## 2017-06-01 LAB — LIPID PANEL
CHOLESTEROL: 248 mg/dL — AB (ref 0–200)
HDL: 56.8 mg/dL (ref >39.00)
LDL CALC: 175 mg/dL — AB (ref 0–99)
NonHDL: 191.54
TRIGLYCERIDES: 84 mg/dL (ref 0.0–149.0)
Total CHOL/HDL Ratio: 4
VLDL: 16.8 mg/dL (ref 0.0–40.0)

## 2017-06-01 NOTE — Patient Instructions (Signed)
Yolanda Walsh , Thank you for taking time to come for your Medicare Wellness Visit. I appreciate your ongoing commitment to your health goals. Please review the following plan we discussed and let me know if I can assist you in the future.   These are the goals we discussed: Goals    . Increase physical activity     Starting 06/01/2017, I will continue to walk 1-5 miles 3 days per week.        This is a list of the screening recommended for you and due dates:  Health Maintenance  Topic Date Due  . Mammogram  04/12/2018  . DTaP/Tdap/Td vaccine (2 - Td) 02/03/2021  . Tetanus Vaccine  02/03/2021  . Colon Cancer Screening  07/26/2021  . Flu Shot  Completed  . DEXA scan (bone density measurement)  Completed  .  Hepatitis C: One time screening is recommended by Center for Disease Control  (CDC) for  adults born from [birth year] through 1965.   Completed  . Pneumonia vaccines  Completed   Preventive Care for Adults  A healthy lifestyle and preventive care can promote health and wellness. Preventive health guidelines for adults include the following key practices.  . A routine yearly physical is a good way to check with your health care provider about your health and preventive screening. It is a chance to share any concerns and updates on your health and to receive a thorough exam.  . Visit your dentist for a routine exam and preventive care every 6 months. Brush your teeth twice a day and floss once a day. Good oral hygiene prevents tooth decay and gum disease.  . The frequency of eye exams is based on your age, health, family medical history, use  of contact lenses, and other factors. Follow your health care provider's recommendations for frequency of eye exams.  . Eat a healthy diet. Foods like vegetables, fruits, whole grains, low-fat dairy products, and lean protein foods contain the nutrients you need without too many calories. Decrease your intake of foods high in solid fats, added sugars,  and salt. Eat the right amount of calories for you. Get information about a proper diet from your health care provider, if necessary.  . Regular physical exercise is one of the most important things you can do for your health. Most adults should get at least 150 minutes of moderate-intensity exercise (any activity that increases your heart rate and causes you to sweat) each week. In addition, most adults need muscle-strengthening exercises on 2 or more days a week.  Silver Sneakers may be a benefit available to you. To determine eligibility, you may visit the website: www.silversneakers.com or contact program at (862)785-1644 Mon-Fri between 8AM-8PM.   . Maintain a healthy weight. The body mass index (BMI) is a screening tool to identify possible weight problems. It provides an estimate of body fat based on height and weight. Your health care provider can find your BMI and can help you achieve or maintain a healthy weight.   For adults 20 years and older: ? A BMI below 18.5 is considered underweight. ? A BMI of 18.5 to 24.9 is normal. ? A BMI of 25 to 29.9 is considered overweight. ? A BMI of 30 and above is considered obese.   . Maintain normal blood lipids and cholesterol levels by exercising and minimizing your intake of saturated fat. Eat a balanced diet with plenty of fruit and vegetables. Blood tests for lipids and cholesterol should begin at age  20 and be repeated every 5 years. If your lipid or cholesterol levels are high, you are over 50, or you are at high risk for heart disease, you may need your cholesterol levels checked more frequently. Ongoing high lipid and cholesterol levels should be treated with medicines if diet and exercise are not working.  . If you smoke, find out from your health care provider how to quit. If you do not use tobacco, please do not start.  . If you choose to drink alcohol, please do not consume more than 2 drinks per day. One drink is considered to be 12  ounces (355 mL) of beer, 5 ounces (148 mL) of wine, or 1.5 ounces (44 mL) of liquor.  . If you are 91-14 years old, ask your health care provider if you should take aspirin to prevent strokes.  . Use sunscreen. Apply sunscreen liberally and repeatedly throughout the day. You should seek shade when your shadow is shorter than you. Protect yourself by wearing long sleeves, pants, a wide-brimmed hat, and sunglasses year round, whenever you are outdoors.  . Once a month, do a whole body skin exam, using a mirror to look at the skin on your back. Tell your health care provider of new moles, moles that have irregular borders, moles that are larger than a pencil eraser, or moles that have changed in shape or color.

## 2017-06-01 NOTE — Progress Notes (Signed)
Pre visit review using our clinic review tool, if applicable. No additional management support is needed unless otherwise documented below in the visit note. 

## 2017-06-01 NOTE — Progress Notes (Signed)
PCP notes:   Health maintenance:  PPSV23 - administered  Abnormal screenings:   Hearing - failed  Hearing Screening   125Hz  250Hz  500Hz  1000Hz  2000Hz  3000Hz  4000Hz  6000Hz  8000Hz   Right ear:   40 40 40  40    Left ear:   0 40 40  40     Patient concerns:   None  Nurse concerns:  None  Next PCP appt:   06/07/17 @ 1030

## 2017-06-01 NOTE — Progress Notes (Signed)
Subjective:   Yolanda Walsh is a 66 y.o. female who presents for Medicare Annual (Subsequent) preventive examination.  Review of Systems:  N/A Cardiac Risk Factors include: advanced age (>16men, >61 women);dyslipidemia;obesity (BMI >30kg/m2)     Objective:     Vitals: BP 104/70 (BP Location: Right Arm, Patient Position: Sitting, Cuff Size: Normal)   Pulse 63   Temp 97.9 F (36.6 C) (Oral)   Ht 5' 4.25" (1.632 m) Comment: no shoes  Wt 187 lb (84.8 kg)   SpO2 97%   BMI 31.85 kg/m   Body mass index is 31.85 kg/m.   Tobacco Social History   Tobacco Use  Smoking Status Never Smoker  Smokeless Tobacco Never Used     Counseling given: No   Past Medical History:  Diagnosis Date  . Ankle fracture    right  . Depression   . History of anal fissures 2013   worsened by constipation   . History of hemorrhoids    w/o complications  . History of rectal fissure   . Hyperlipidemia 04/24/2012  . Lichen sclerosus    temovate cream  . Medial meniscus tear   . Osteopenia    dexa 2013, 2016  . Trigger finger    Past Surgical History:  Procedure Laterality Date  . COLONOSCOPY  09/2003  . COLONOSCOPY  07/2016   TA, diverticulosis, rpt 5 yrs Henrene Pastor)  . dexa  04/2012   T -2.1 hip, -1.2 spine  . dexa  03/2015   T -2.4 hip, -1.7 spine  . I&D EXTREMITY Right 03/16/2014   cat bite hand s/p I&D in OR ([name])  . left hand surgery  1962   tendon laceration  . PARTIAL HYSTERECTOMY  1980   for prolapse, ovaries remain   Family History  Problem Relation Age of Onset  . Coronary artery disease Brother 40       7 stents  . Cancer Mother        leukemia  . Coronary artery disease Mother 83  . Coronary artery disease Father 36       MI  . Parkinsonism Brother   . Hypertension Brother   . Hypertension Sister   . Crohn's disease Sister   . Diabetes Maternal Grandfather    Social History   Substance and Sexual Activity  Sexual Activity Not on file    Outpatient  Encounter Medications as of 06/01/2017  Medication Sig  . AMBULATORY NON FORMULARY MEDICATION Medication Name: nitroglycerin 0.125% gel to [facility]. apply a pea size amount to your rectum three times daily x 6-8 weeks.  Marland Kitchen aspirin EC 81 MG tablet Take 81 mg by mouth daily as needed.   . Calcium Carb-Cholecalciferol (CALCIUM-VITAMIN D) 600-400 MG-UNIT TABS Take 1 tablet by mouth daily.  . cetirizine (ZYRTEC) 10 MG tablet Take 10 mg by mouth daily.  . Cholecalciferol (VITAMIN D3) 1000 UNITS CAPS Take 1 capsule (1,000 Units total) by mouth daily.  . diclofenac (VOLTAREN) 75 MG EC tablet Take 1 tablet (75 mg total) by mouth 2 (two) times daily. For 7-14 days as needed  . ibuprofen (ADVIL,MOTRIN) 200 MG tablet Take 200 mg by mouth every 6 (six) hours as needed.  . sertraline (ZOLOFT) 50 MG tablet TAKE ONE TABLET BY MOUTH ONCE DAILY   Facility-Administered Encounter Medications as of 06/01/2017  Medication  . 0.9 %  sodium chloride infusion    Activities of Daily Living In your present state of health, do you have any difficulty  performing the following activities: 06/01/2017  Hearing? Y  Comment ringing in both ears  Vision? N  Comment has initial stage of cataracts  Difficulty concentrating or making decisions? N  Walking or climbing stairs? N  Dressing or bathing? N  Doing errands, shopping? N  Preparing Food and eating ? N  Using the Toilet? N  In the past six months, have you accidently leaked urine? N  Do you have problems with loss of bowel control? N  Managing your Medications? N  Managing your Finances? N  Housekeeping or managing your Housekeeping? N  Some recent data might be hidden    Patient Care Team: Ria Bush, MD as PCP - General (Family Medicine) Eulogio Bear, MD as Consulting Physician (Ophthalmology)    Assessment:     Hearing Screening   125Hz  250Hz  500Hz  1000Hz  2000Hz  3000Hz  4000Hz  6000Hz  8000Hz   Right ear:   40 40 40  40    Left ear:    0 40 40  40    Vision Screening Comments: Last vision exam on 06/01/2017 with Dr. Benay Pillow   Exercise Activities and Dietary recommendations Current Exercise Habits: Home exercise routine, Type of exercise: walking, Time (Minutes): 30(walks 1-5 miles 3x/wk), Frequency (Times/Week): 3, Weekly Exercise (Minutes/Week): 90, Intensity: Mild, Exercise limited by: None identified  Goals    . Increase physical activity     Starting 06/01/2017, I will continue to walk 1-5 miles 3 days per week.       Fall Risk Fall Risk  06/01/2017 05/27/2016  Falls in the past year? No Yes  Number falls in past yr: - 1  Injury with Fall? - Yes  Comment - Broken ankle   Depression Screen PHQ 2/9 Scores 06/01/2017 05/27/2016  PHQ - 2 Score 1 0  PHQ- 9 Score 1 -     Cognitive Function MMSE - Mini Mental State Exam 06/01/2017  Orientation to time 5  Orientation to Place 5  Registration 3  Attention/ Calculation 0  Recall 3  Language- name 2 objects 0  Language- repeat 1  Language- follow 3 step command 3  Language- read & follow direction 0  Write a sentence 0  Copy design 0  Total score 20     PLEASE NOTE: A Mini-Cog screen was completed. Maximum score is 20. A value of 0 denotes this part of Folstein MMSE was not completed or the patient failed this part of the Mini-Cog screening.   Mini-Cog Screening Orientation to Time - Max 5 pts Orientation to Place - Max 5 pts Registration - Max 3 pts Recall - Max 3 pts Language Repeat - Max 1 pts Language Follow 3 Step Command - Max 3 pts     Immunization History  Administered Date(s) Administered  . Influenza Split 04/18/2012  . Influenza Whole 04/13/2008  . Influenza,inj,Quad PF,6+ Mos 04/19/2013, 04/01/2014, 03/28/2015, 03/31/2016, 04/08/2017  . Pneumococcal Conjugate-13 05/27/2016  . Pneumococcal Polysaccharide-23 06/01/2017  . Td 03/13/1999  . Tdap 02/04/2011  . Zoster 05/23/2012   Screening Tests Health Maintenance  Topic Date  Due  . MAMMOGRAM  04/12/2018  . DTaP/Tdap/Td (2 - Td) 02/03/2021  . TETANUS/TDAP  02/03/2021  . COLONOSCOPY  07/26/2021  . INFLUENZA VACCINE  Completed  . DEXA SCAN  Completed  . Hepatitis C Screening  Completed  . PNA vac Low Risk Adult  Completed      Plan:     I have personally reviewed, addressed, and noted the following in the patient's chart:  A. Medical and social history B. Use of alcohol, tobacco or illicit drugs  C. Current medications and supplements D. Functional ability and status E.  Nutritional status F.  Physical activity G. Advance directives H. List of other physicians I.  Hospitalizations, surgeries, and ER visits in previous 12 months J.  [place] to include hearing, vision, cognitive, depression L. Referrals and appointments - none  In addition, I have reviewed and discussed with patient certain preventive protocols, quality metrics, and best practice recommendations. A written personalized care plan for preventive services as well as general preventive health recommendations were provided to patient.  See attached scanned questionnaire for additional information.   Signed,   Lindell Noe, MHA, BS, LPN Health Coach

## 2017-06-05 NOTE — Progress Notes (Signed)
I reviewed health advisor's note, was available for consultation, and agree with documentation and plan.  

## 2017-06-07 ENCOUNTER — Ambulatory Visit (INDEPENDENT_AMBULATORY_CARE_PROVIDER_SITE_OTHER): Payer: PPO | Admitting: Family Medicine

## 2017-06-07 ENCOUNTER — Encounter: Payer: Self-pay | Admitting: Family Medicine

## 2017-06-07 VITALS — BP 118/68 | HR 72 | Temp 98.4°F | Ht 64.0 in | Wt 190.0 lb

## 2017-06-07 DIAGNOSIS — M8580 Other specified disorders of bone density and structure, unspecified site: Secondary | ICD-10-CM | POA: Diagnosis not present

## 2017-06-07 DIAGNOSIS — Z0000 Encounter for general adult medical examination without abnormal findings: Secondary | ICD-10-CM

## 2017-06-07 DIAGNOSIS — H3530 Unspecified macular degeneration: Secondary | ICD-10-CM | POA: Diagnosis not present

## 2017-06-07 DIAGNOSIS — F419 Anxiety disorder, unspecified: Secondary | ICD-10-CM | POA: Diagnosis not present

## 2017-06-07 DIAGNOSIS — E669 Obesity, unspecified: Secondary | ICD-10-CM | POA: Diagnosis not present

## 2017-06-07 DIAGNOSIS — Z7189 Other specified counseling: Secondary | ICD-10-CM

## 2017-06-07 DIAGNOSIS — E785 Hyperlipidemia, unspecified: Secondary | ICD-10-CM

## 2017-06-07 MED ORDER — SERTRALINE HCL 50 MG PO TABS: 50.0000 mg | ORAL_TABLET | Freq: Every day | ORAL | 3 refills | Status: DC

## 2017-06-07 NOTE — Assessment & Plan Note (Signed)
Feels very stable on current regimen, desires to continue current dose.

## 2017-06-07 NOTE — Assessment & Plan Note (Signed)
Preventative protocols reviewed and updated unless pt declined. Discussed healthy diet and lifestyle.  

## 2017-06-07 NOTE — Assessment & Plan Note (Signed)
Reviewed importance of regular weight bearing exercise and calcium/vit D intake.

## 2017-06-07 NOTE — Progress Notes (Addendum)
BP 118/68 (BP Location: Left Arm, Patient Position: Sitting, Cuff Size: Normal)   Pulse 72   Temp 98.4 F (36.9 C) (Oral)   Ht 5\' 4"  (1.626 m)   Wt 190 lb (86.2 kg)   SpO2 93%   BMI 32.61 kg/m    CC: CPE Subjective:    Patient ID: Yolanda Walsh, female    DOB: Sep 08, 1950, 66 y.o.   MRN: [id number]  HPI: Yolanda Walsh is a 66 y.o. female presenting on 06/07/2017 for Annual Exam (Pt 2)   Saw Katha Cabal last week for medicare wellness visit. Note reviewed. Chronic L ear tinnitus.   Saw eye doctor - last week - intermediate ARMD started on preservision.  Brother died in 05/04/23 from Parkinson's disease.   Preventative: Colonoscopy 07/2016 - TA, diverticulosis, rpt 5 yrs Henrene Pastor) Well woman with Dr. Marvel Plan h/o partal hysterectomy.sees yearly Mammogram - WNL 04/2017 DEXA 04/2012, 05/04/15, 04/2017 T -2.4 hip, - reviewed calicum and vit D intake, rec weight bearing exercise.  Flu - yearly prevnar 2017, pneumovax 2018 - localized reaction to this.  Tdap 2012. zostavax - 2013 shingrix - discussed Advanced directive - has at home. Daughter in [place] is HCPOA. Asked to bring [place] a copy. Does not want prolonged life support. Ok with temporary measures. Seat belt use discussed Sunscreen use discussed. No changing moles on skin. Non smoker Alcohol - none  Lives alone with cat and dog.daughter nearby. Widow, husband deceased from [place] Walsh. Occupation: retired, prior worked in [employer], educational Activity: started [employer]  Diet: good water, fruits/vegetables daily  Relevant past medical, surgical, family and social history reviewed and updated as indicated. Interim medical history since our last visit reviewed. Allergies and medications reviewed and updated. Outpatient Medications Prior to Visit  Medication Sig Dispense Refill  . AMBULATORY NON FORMULARY MEDICATION Medication Name: nitroglycerin 0.125% gel to [facility]. apply a pea size amount to your rectum three times daily  x 6-8 weeks. 30 g 0  . Calcium Carb-Cholecalciferol (CALCIUM-VITAMIN D) 600-400 MG-UNIT TABS Take 1 tablet by mouth daily.    . cetirizine (ZYRTEC) 10 MG tablet Take 10 mg by mouth daily.    . Cholecalciferol (VITAMIN D3) 1000 UNITS CAPS Take 1 capsule (1,000 Units total) by mouth daily. 30 capsule   . diclofenac (VOLTAREN) 75 MG EC tablet Take 1 tablet (75 mg total) by mouth 2 (two) times daily. For 7-14 days as needed 30 tablet 0  . ibuprofen (ADVIL,MOTRIN) 200 MG tablet Take 200 mg by mouth every 6 (six) hours as needed.    . Multiple Vitamins-Minerals (PRESERVISION AREDS 2+MULTI VIT PO) Take 2 capsules by mouth daily.    Marland Kitchen aspirin EC 81 MG tablet Take 81 mg by mouth daily as needed.     . sertraline (ZOLOFT) 50 MG tablet TAKE ONE TABLET BY MOUTH ONCE DAILY 90 tablet 2  . 0.9 %  sodium chloride infusion      No facility-administered medications prior to visit.      Per HPI unless specifically indicated in ROS section below Review of Systems  Constitutional: Negative for activity change, appetite change, chills, fatigue, fever and unexpected weight change.  HENT: Negative for hearing loss.   Eyes: Negative for visual disturbance.  Respiratory: Negative for cough, chest tightness, shortness of breath and wheezing.   Cardiovascular: Negative for chest pain, palpitations and leg swelling.  Gastrointestinal: Negative for abdominal distention, abdominal pain, blood in stool, constipation, diarrhea, nausea and vomiting.  Genitourinary: Negative for difficulty urinating  and hematuria.  Musculoskeletal: Negative for arthralgias, myalgias and neck pain.  Skin: Negative for rash.  Neurological: Negative for dizziness, seizures, syncope and headaches.  Hematological: Negative for adenopathy. Does not bruise/bleed easily.  Psychiatric/Behavioral: Negative for dysphoric mood. The patient is not nervous/anxious.        Grieving brother's death       Objective:    BP 118/68 (BP Location: Left  Arm, Patient Position: Sitting, Cuff Size: Normal)   Pulse 72   Temp 98.4 F (36.9 C) (Oral)   Ht 5\' 4"  (1.626 m)   Wt 190 lb (86.2 kg)   SpO2 93%   BMI 32.61 kg/m   Wt Readings from Last 3 Encounters:  06/07/17 190 lb (86.2 kg)  06/01/17 187 lb (84.8 kg)  03/12/17 192 lb 8 oz (87.3 kg)    Physical Exam  Constitutional: She is oriented to person, place, and time. She appears well-developed and well-nourished. No distress.  HENT:  Head: Normocephalic and atraumatic.  Right Ear: Hearing, tympanic membrane, external ear and ear canal normal.  Left Ear: Hearing, tympanic membrane, external ear and ear canal normal.  Nose: Nose normal.  Mouth/Throat: Uvula is midline, oropharynx is clear and moist and mucous membranes are normal. No oropharyngeal exudate, posterior oropharyngeal edema or posterior oropharyngeal erythema.  Eyes: Conjunctivae and EOM are normal. Pupils are equal, round, and reactive to light. No scleral icterus.  Neck: Normal range of motion. Neck supple. Carotid bruit is not present. No thyromegaly present.  Cardiovascular: Normal rate, regular rhythm, normal heart sounds and intact distal pulses.  No murmur heard. Pulses:      Radial pulses are 2+ on the right side, and 2+ on the left side.  Pulmonary/Chest: Effort normal and breath sounds normal. No respiratory distress. She has no wheezes. She has no rales.  Abdominal: Soft. Bowel sounds are normal. She exhibits no distension and no mass. There is no tenderness. There is no rebound and no guarding.  Musculoskeletal: Normal range of motion. She exhibits no edema.  Lymphadenopathy:    She has no cervical adenopathy.  Neurological: She is alert and oriented to person, place, and time.  CN grossly intact, station and gait intact  Skin: Skin is warm and dry. No rash noted.  Psychiatric: She has a normal mood and affect. Her behavior is normal. Judgment and thought content normal.  Nursing note and vitals  reviewed.  Results for orders placed or performed in visit on 40/98/11  Basic metabolic panel  Result Value Ref Range   Sodium 137 135 - 145 mEq/L   Potassium 5.2 (H) 3.5 - 5.1 mEq/L   Chloride 103 96 - 112 mEq/L   CO2 28 19 - 32 mEq/L   Glucose, Bld 103 (H) 70 - 99 mg/dL   BUN 15 6 - 23 mg/dL   Creatinine, Ser 0.82 0.40 - 1.20 mg/dL   Calcium 9.8 8.4 - 10.5 mg/dL   GFR 74.00 >60.00 mL/min  TSH  Result Value Ref Range   TSH 2.37 0.35 - 4.50 uIU/mL  Lipid panel  Result Value Ref Range   Cholesterol 248 (H) 0 - 200 mg/dL   Triglycerides 84.0 0.0 - 149.0 mg/dL   HDL 56.80 >39.00 mg/dL   VLDL 16.8 0.0 - 40.0 mg/dL   LDL Cholesterol 175 (H) 0 - 99 mg/dL   Total CHOL/HDL Ratio 4    NonHDL 191.54       Assessment & Plan:  Ok to stop aspirin as no known h/o  CAD.  Problem List Items Addressed This Visit    Advanced care planning/counseling discussion    Advanced directive - has at home. Daughter in [place] is HCPOA. Asked to bring [place] a copy. Does not want prolonged life support. Ok with temporary measures.       Anxiety    Feels very stable on current regimen, desires to continue current dose.       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 Discussed healthy diet and lifestyle.       Hyperlipidemia    Chronic. Not on statin.  The 10-year ASCVD risk score Mikey Bussing DC Brooke Bonito., et al., 2013) is: 5.8%   Values used to calculate the score:     Age: 9 years     Sex: Female     Is Non-Hispanic African American: No     Diabetic: No     Tobacco smoker: No     Systolic Blood Pressure: 017 mmHg     Is BP treated: No     HDL Cholesterol: 56.8 mg/dL     Total Cholesterol: 248 mg/dL       Obesity, Class I, BMI 30-34.9    Reviewed healthy diet and lifestyle changes to affect  sustainable weight loss.       Osteopenia    Reviewed importance of regular weight bearing exercise and calcium/vit D intake.           Follow up plan: Return in about 1 year (around 06/07/2018) for annual exam, prior fasting for blood work, medicare wellness visit.  Ria Bush, MD

## 2017-06-07 NOTE — Patient Instructions (Addendum)
Take 1 calcium/vit D pill daily in addition to [place].  If interested, check with pharmacy about new 2 shot shingles series (shingrix).  Bring me copy of your living will to update your chart. Ok to stop aspirin.  Work on [employer] and exercising.  Return as needed or in 1 year for next physical. Health Maintenance, Female Adopting a healthy lifestyle and getting preventive care can go a long way to promote health and wellness. Talk with your health care provider about what schedule of regular examinations is right for you. This is a good chance for you to check in with your provider about disease prevention and staying healthy. In between checkups, there are plenty of things you can do on your own. Experts have done a lot of research about which lifestyle changes and preventive measures are most likely to keep you healthy. Ask your health care provider for more information. Weight and diet Eat a healthy diet  Be sure to include plenty of vegetables, fruits, low-fat dairy products, and lean protein.  Do not eat a lot of foods high in solid fats, added sugars, or salt.  Get regular exercise. This is one of the most important things you can do for your health. ? Most adults should exercise for at least 150 minutes each week. The exercise should increase your heart rate and make you sweat (moderate-intensity exercise). ? Most adults should also do strengthening exercises at least twice a week. This is in addition to the moderate-intensity exercise.  Maintain a healthy weight  Body mass index (BMI) is a measurement that can be used to identify possible weight problems. It estimates body fat based on height and weight. Your health care provider can help determine your BMI and help you achieve or maintain a healthy weight.  For females 63 years of age and older: ? A BMI below 18.5 is considered underweight. ? A BMI of 18.5 to 24.9 is normal. ? A BMI of 25 to 29.9 is considered  overweight. ? A BMI of 30 and above is considered obese.  Watch levels of cholesterol and blood lipids  You should start having your blood tested for lipids and cholesterol at 66 years of age, then have this test every 5 years.  You may need to have your cholesterol levels checked more often if: ? Your lipid or cholesterol levels are high. ? You are older than 66 years of age. ? You are at high risk for heart disease.  Cancer screening Lung Cancer  Lung cancer screening is recommended for adults 65-45 years old who are at high risk for lung cancer because of a history of smoking.  A yearly low-dose CT scan of the lungs is recommended for people who: ? Currently smoke. ? Have quit within the past 15 years. ? Have at least a 30-pack-year history of smoking. A pack year is smoking an average of one pack of cigarettes a day for 1 year.  Yearly screening should continue until it has been 15 years since you quit.  Yearly screening should stop if you develop a health problem that would prevent you from having lung cancer treatment.  Breast Cancer  Practice breast self-awareness. This means understanding how your breasts normally appear and feel.  It also means doing regular breast self-exams. Let your health care provider know about any changes, no matter how small.  If you are in your 20s or 30s, you should have a clinical breast exam (CBE) by a health care provider  every 1-3 years as part of a regular health exam.  If you are 41 or older, have a CBE every year. Also consider having a breast X-ray (mammogram) every year.  If you have a family history of breast cancer, talk to your health care provider about genetic screening.  If you are at high risk for breast cancer, talk to your health care provider about having an MRI and a mammogram every year.  Breast cancer gene (BRCA) assessment is recommended for women who have family members with BRCA-related cancers. BRCA-related cancers  include: ? Breast. ? Ovarian. ? Tubal. ? Peritoneal cancers.  Results of the assessment will determine the need for genetic counseling and BRCA1 and BRCA2 testing.  Cervical Cancer Your health care provider may recommend that you be screened regularly for cancer of the pelvic organs (ovaries, uterus, and vagina). This screening involves a pelvic examination, including checking for microscopic changes to the surface of your cervix (Pap test). You may be encouraged to have this screening done every 3 years, beginning at age 65.  For women ages 44-65, health care providers may recommend pelvic exams and Pap testing every 3 years, or they may recommend the Pap and pelvic exam, combined with testing for human papilloma virus (HPV), every 5 years. Some types of HPV increase your risk of cervical cancer. Testing for HPV may also be done on women of any age with unclear Pap test results.  Other health care providers may not recommend any screening for nonpregnant women who are considered low risk for pelvic cancer and who do not have symptoms. Ask your health care provider if a screening pelvic exam is right for you.  If you have had past treatment for cervical cancer or a condition that could lead to cancer, you need Pap tests and screening for cancer for at least 20 years after your treatment. If Pap tests have been discontinued, your risk factors (such as having a new sexual partner) need to be reassessed to determine if screening should resume. Some women have medical problems that increase the chance of getting cervical cancer. In these cases, your health care provider may recommend more frequent screening and Pap tests.  Colorectal Cancer  This type of cancer can be detected and often prevented.  Routine colorectal cancer screening usually begins at 66 years of age and continues through 66 years of age.  Your health care provider may recommend screening at an earlier age if you have risk factors  for colon cancer.  Your health care provider may also recommend using home test kits to check for hidden blood in the stool.  A small camera at the end of a tube can be used to examine your colon directly (sigmoidoscopy or colonoscopy). This is done to check for the earliest forms of colorectal cancer.  Routine screening usually begins at age 19.  Direct examination of the colon should be repeated every 5-10 years through 66 years of age. However, you may need to be screened more often if early forms of precancerous polyps or small growths are found.  Skin Cancer  Check your skin from head to toe regularly.  Tell your health care provider about any new moles or changes in moles, especially if there is a change in a mole's shape or color.  Also tell your health care provider if you have a mole that is larger than the size of a pencil eraser.  Always use sunscreen. Apply sunscreen liberally and repeatedly throughout the day.  Protect yourself by wearing long sleeves, pants, a wide-brimmed hat, and sunglasses whenever you are outside.  Heart disease, diabetes, and high blood pressure  High blood pressure causes heart disease and increases the risk of stroke. High blood pressure is more likely to develop in: ? People who have blood pressure in the high end of the normal range (130-139/85-89 mm Hg). ? People who are overweight or obese. ? People who are African American.  If you are 57-39 years of age, have your blood pressure checked every 3-5 years. If you are 19 years of age or older, have your blood pressure checked every year. You should have your blood pressure measured twice-once when you are at a hospital or clinic, and once when you are not at a hospital or clinic. Record the average of the two measurements. To check your blood pressure when you are not at a hospital or clinic, you can use: ? An automated blood pressure machine at a pharmacy. ? A home blood pressure monitor.  If  you are between 19 years and 38 years old, ask your health care provider if you should take aspirin to prevent strokes.  Have regular diabetes screenings. This involves taking a blood sample to check your fasting blood sugar level. ? If you are at a normal weight and have a low risk for diabetes, have this test once every three years after 66 years of age. ? If you are overweight and have a high risk for diabetes, consider being tested at a younger age or more often. Preventing infection Hepatitis B  If you have a higher risk for hepatitis B, you should be screened for this virus. You are considered at high risk for hepatitis B if: ? You were born in a country where hepatitis B is common. Ask your health care provider which countries are considered high risk. ? Your parents were born in a high-risk country, and you have not been immunized against hepatitis B (hepatitis B vaccine). ? You have HIV or AIDS. ? You use needles to inject street drugs. ? You live with someone who has hepatitis B. ? You have had sex with someone who has hepatitis B. ? You get hemodialysis treatment. ? You take certain medicines for conditions, including cancer, organ transplantation, and autoimmune conditions.  Hepatitis C  Blood testing is recommended for: ? Everyone born from [birth year] through 1965. ? Anyone with known risk factors for hepatitis C.  Sexually transmitted infections (STIs)  You should be screened for sexually transmitted infections (STIs) including gonorrhea and chlamydia if: ? You are sexually active and are younger than 66 years of age. ? You are older than 66 years of age and your health care provider tells you that you are at risk for this type of infection. ? Your sexual activity has changed since you were last screened and you are at an increased risk for chlamydia or gonorrhea. Ask your health care provider if you are at risk.  If you do not have HIV, but are at risk, it may be recommended  that you take a prescription medicine daily to prevent HIV infection. This is called pre-exposure prophylaxis (PrEP). You are considered at risk if: ? You are sexually active and do not regularly use condoms or know the HIV status of your partner(s). ? You take drugs by injection. ? You are sexually active with a partner who has HIV.  Talk with your health care provider about whether you are at high risk of  being infected with HIV. If you choose to begin PrEP, you should first be tested for HIV. You should then be tested every 3 months for as long as you are taking PrEP. Pregnancy  If you are premenopausal and you may become pregnant, ask your health care provider about preconception counseling.  If you may become pregnant, take 400 to 800 micrograms (mcg) of folic acid every day.  If you want to prevent pregnancy, talk to your health care provider about birth control (contraception). Osteoporosis and menopause  Osteoporosis is a disease in which the bones lose minerals and strength with aging. This can result in serious bone fractures. Your risk for osteoporosis can be identified using a bone density scan.  If you are 63 years of age or older, or if you are at risk for osteoporosis and fractures, ask your health care provider if you should be screened.  Ask your health care provider whether you should take a calcium or vitamin D supplement to lower your risk for osteoporosis.  Menopause may have certain physical symptoms and risks.  Hormone replacement therapy may reduce some of these symptoms and risks. Talk to your health care provider about whether hormone replacement therapy is right for you. Follow these instructions at home:  Schedule regular health, dental, and eye exams.  Stay current with your immunizations.  Do not use any tobacco products including cigarettes, chewing tobacco, or electronic cigarettes.  If you are pregnant, do not drink alcohol.  If you are  breastfeeding, limit how much and how often you drink alcohol.  Limit alcohol intake to no more than 1 drink per day for nonpregnant women. One drink equals 12 ounces of beer, 5 ounces of wine, or 1 ounces of hard liquor.  Do not use street drugs.  Do not share needles.  Ask your health care provider for help if you need support or information about quitting drugs.  Tell your health care provider if you often feel depressed.  Tell your health care provider if you have ever been abused or do not feel safe at home. This information is not intended to replace advice given to you by your health care provider. Make sure you discuss any questions you have with your health care provider. Document Released: 01/11/2011 Document Revised: 12/04/2015 Document Reviewed: 04/01/2015 Elsevier Interactive Patient Education  [employer].

## 2017-06-07 NOTE — Assessment & Plan Note (Signed)
Chronic. Not on statin.  The 10-year ASCVD risk score Mikey Bussing DC Brooke Bonito., et al., 2013) is: 5.8%   Values used to calculate the score:     Age: 66 years     Sex: Female     Is Non-Hispanic African American: No     Diabetic: No     Tobacco smoker: No     Systolic Blood Pressure: 532 mmHg     Is BP treated: No     HDL Cholesterol: 56.8 mg/dL     Total Cholesterol: 248 mg/dL

## 2017-06-07 NOTE — Addendum Note (Signed)
Addended by: Ria Bush on: 06/07/2017 11:30 AM   Modules accepted: Orders

## 2017-06-07 NOTE — Assessment & Plan Note (Signed)
Advanced directive - has at home. Daughter in [place] is [place]CP[place]. Asked to bring us a copy. Does not want prolonged life support. Ok with temporary measures. 

## 2017-06-07 NOTE — Assessment & Plan Note (Signed)
Reviewed healthy diet and lifestyle changes to affect sustainable weight loss.  

## 2017-06-07 NOTE — Assessment & Plan Note (Signed)
Started on preservision AREDS

## 2017-09-12 DIAGNOSIS — Z1389 Encounter for screening for other disorder: Secondary | ICD-10-CM | POA: Diagnosis not present

## 2017-09-12 DIAGNOSIS — Z6832 Body mass index (BMI) 32.0-32.9, adult: Secondary | ICD-10-CM | POA: Diagnosis not present

## 2017-09-12 DIAGNOSIS — Z01419 Encounter for gynecological examination (general) (routine) without abnormal findings: Secondary | ICD-10-CM | POA: Diagnosis not present

## 2017-09-12 DIAGNOSIS — L900 Lichen sclerosus et atrophicus: Secondary | ICD-10-CM | POA: Diagnosis not present

## 2017-09-12 DIAGNOSIS — Z124 Encounter for screening for malignant neoplasm of cervix: Secondary | ICD-10-CM | POA: Diagnosis not present

## 2018-04-06 ENCOUNTER — Ambulatory Visit (INDEPENDENT_AMBULATORY_CARE_PROVIDER_SITE_OTHER): Payer: PPO

## 2018-04-06 DIAGNOSIS — Z23 Encounter for immunization: Secondary | ICD-10-CM

## 2018-04-13 DIAGNOSIS — Z1231 Encounter for screening mammogram for malignant neoplasm of breast: Secondary | ICD-10-CM | POA: Diagnosis not present

## 2018-04-13 LAB — HM MAMMOGRAPHY

## 2018-04-17 ENCOUNTER — Encounter: Payer: Self-pay | Admitting: Family Medicine

## 2018-05-31 ENCOUNTER — Other Ambulatory Visit: Payer: Self-pay | Admitting: Family Medicine

## 2018-06-01 ENCOUNTER — Other Ambulatory Visit: Payer: Self-pay | Admitting: Family Medicine

## 2018-06-01 DIAGNOSIS — E785 Hyperlipidemia, unspecified: Secondary | ICD-10-CM

## 2018-06-01 DIAGNOSIS — M8580 Other specified disorders of bone density and structure, unspecified site: Secondary | ICD-10-CM

## 2018-06-01 DIAGNOSIS — H353132 Nonexudative age-related macular degeneration, bilateral, intermediate dry stage: Secondary | ICD-10-CM | POA: Diagnosis not present

## 2018-06-02 ENCOUNTER — Ambulatory Visit (INDEPENDENT_AMBULATORY_CARE_PROVIDER_SITE_OTHER): Payer: PPO

## 2018-06-02 VITALS — BP 90/60 | HR 65 | Temp 97.9°F | Ht 65.5 in | Wt 189.2 lb

## 2018-06-02 DIAGNOSIS — E785 Hyperlipidemia, unspecified: Secondary | ICD-10-CM | POA: Diagnosis not present

## 2018-06-02 DIAGNOSIS — M8580 Other specified disorders of bone density and structure, unspecified site: Secondary | ICD-10-CM

## 2018-06-02 DIAGNOSIS — Z0000 Encounter for general adult medical examination without abnormal findings: Secondary | ICD-10-CM | POA: Diagnosis not present

## 2018-06-02 LAB — COMPREHENSIVE METABOLIC PANEL
ALBUMIN: 4.2 g/dL (ref 3.5–5.2)
ALK PHOS: 70 U/L (ref 39–117)
ALT: 10 U/L (ref 0–35)
AST: 16 U/L (ref 0–37)
BUN: 16 mg/dL (ref 6–23)
CALCIUM: 9.4 mg/dL (ref 8.4–10.5)
CHLORIDE: 104 meq/L (ref 96–112)
CO2: 28 mEq/L (ref 19–32)
Creatinine, Ser: 0.85 mg/dL (ref 0.40–1.20)
GFR: 70.78 mL/min (ref >60.00)
Glucose, Bld: 100 mg/dL — ABNORMAL HIGH (ref 70–99)
Potassium: 4.2 mEq/L (ref 3.5–5.1)
Sodium: 139 mEq/L (ref 135–145)
TOTAL PROTEIN: 7.3 g/dL (ref 6.0–8.3)
Total Bilirubin: 0.5 mg/dL (ref 0.2–1.2)

## 2018-06-02 LAB — LIPID PANEL
CHOLESTEROL: 249 mg/dL — AB (ref 0–200)
HDL: 50.2 mg/dL (ref >39.00)
LDL CALC: 182 mg/dL — AB (ref 0–99)
NonHDL: 198.65
TRIGLYCERIDES: 81 mg/dL (ref 0.0–149.0)
Total CHOL/HDL Ratio: 5
VLDL: 16.2 mg/dL (ref 0.0–40.0)

## 2018-06-02 LAB — VITAMIN D 25 HYDROXY (VIT D DEFICIENCY, FRACTURES): VITD: 23.14 ng/mL — ABNORMAL LOW (ref 30.00–100.00)

## 2018-06-02 NOTE — Patient Instructions (Signed)
Ms. Campise , Thank you for taking time to come for your Medicare Wellness Visit. I appreciate your ongoing commitment to your health goals. Please review the following plan we discussed and let me know if I can assist you in the future.   These are the goals we discussed: Goals    . Increase physical activity     Starting 06/02/2018 and weather permitting, I will continue to walk 1-2 miles daily.         This is a list of the screening recommended for you and due dates:  Health Maintenance  Topic Date Due  . Mammogram  04/14/2019  . DTaP/Tdap/Td vaccine (2 - Td) 02/03/2021  . Tetanus Vaccine  02/03/2021  . Colon Cancer Screening  07/26/2021  . Flu Shot  Completed  . DEXA scan (bone density measurement)  Completed  .  Hepatitis C: One time screening is recommended by Center for Disease Control  (CDC) for  adults born from [birth year] through 1965.   Completed  . Pneumonia vaccines  Completed   Preventive Care for Adults  A healthy lifestyle and preventive care can promote health and wellness. Preventive health guidelines for adults include the following key practices.  . A routine yearly physical is a good way to check with your health care provider about your health and preventive screening. It is a chance to share any concerns and updates on your health and to receive a thorough exam.  . Visit your dentist for a routine exam and preventive care every 6 months. Brush your teeth twice a day and floss once a day. Good oral hygiene prevents tooth decay and gum disease.  . The frequency of eye exams is based on your age, health, family medical history, use  of contact lenses, and other factors. Follow your health care provider's recommendations for frequency of eye exams.  . Eat a healthy diet. Foods like vegetables, fruits, whole grains, low-fat dairy products, and lean protein foods contain the nutrients you need without too many calories. Decrease your intake of foods high in solid fats,  added sugars, and salt. Eat the right amount of calories for you. Get information about a proper diet from your health care provider, if necessary.  . Regular physical exercise is one of the most important things you can do for your health. Most adults should get at least 150 minutes of moderate-intensity exercise (any activity that increases your heart rate and causes you to sweat) each week. In addition, most adults need muscle-strengthening exercises on 2 or more days a week.  Silver Sneakers may be a benefit available to you. To determine eligibility, you may visit the website: www.silversneakers.com or contact program at 843-643-1884 Mon-Fri between 8AM-8PM.   . Maintain a healthy weight. The body mass index (BMI) is a screening tool to identify possible weight problems. It provides an estimate of body fat based on height and weight. Your health care provider can find your BMI and can help you achieve or maintain a healthy weight.   For adults 20 years and older: ? A BMI below 18.5 is considered underweight. ? A BMI of 18.5 to 24.9 is normal. ? A BMI of 25 to 29.9 is considered overweight. ? A BMI of 30 and above is considered obese.   . Maintain normal blood lipids and cholesterol levels by exercising and minimizing your intake of saturated fat. Eat a balanced diet with plenty of fruit and vegetables. Blood tests for lipids and cholesterol should begin at  age 41 and be repeated every 5 years. If your lipid or cholesterol levels are high, you are over 50, or you are at high risk for heart disease, you may need your cholesterol levels checked more frequently. Ongoing high lipid and cholesterol levels should be treated with medicines if diet and exercise are not working.  . If you smoke, find out from your health care provider how to quit. If you do not use tobacco, please do not start.  . If you choose to drink alcohol, please do not consume more than 2 drinks per day. One drink is  considered to be 12 ounces (355 mL) of beer, 5 ounces (148 mL) of wine, or 1.5 ounces (44 mL) of liquor.  . If you are 61-37 years old, ask your health care provider if you should take aspirin to prevent strokes.  . Use sunscreen. Apply sunscreen liberally and repeatedly throughout the day. You should seek shade when your shadow is shorter than you. Protect yourself by wearing long sleeves, pants, a wide-brimmed hat, and sunglasses year round, whenever you are outdoors.  . Once a month, do a whole body skin exam, using a mirror to look at the skin on your back. Tell your health care provider of new moles, moles that have irregular borders, moles that are larger than a pencil eraser, or moles that have changed in shape or color.

## 2018-06-02 NOTE — Progress Notes (Signed)
PCP notes:   Health maintenance:  No gaps identified.   Abnormal screenings:   None  Patient concerns:   None  Nurse concerns:  None  Next PCP appt:   06/12/2018 @ 1130

## 2018-06-02 NOTE — Progress Notes (Signed)
Subjective:   Yolanda Walsh is a 67 y.o. female who presents for Medicare Annual (Subsequent) preventive examination.  Review of Systems:  N/A Cardiac Risk Factors include: advanced age (>14men, >87 women);dyslipidemia     Objective:     Vitals: BP 90/60 (BP Location: Right Arm, Patient Position: Sitting, Cuff Size: Normal)   Pulse 65   Temp 97.9 F (36.6 C) (Oral)   Ht 5' 5.5" (1.664 m) Comment: shoes  Wt 189 lb 4 oz (85.8 kg)   SpO2 95%   BMI 31.01 kg/m   Body mass index is 31.01 kg/m.  Advanced Directives 06/02/2018 06/01/2017 03/15/2014  Does Patient Have a Medical Advance Directive? Yes Yes No  Type of Paramedic of [place];Living will [place];Living will -  Copy of [place] in Chart? No - copy requested No - copy requested -  Would patient like information on creating a medical advance directive? - - No - patient declined information    Tobacco Social History   Tobacco Use  Smoking Status Never Smoker  Smokeless Tobacco Never Used     Counseling given: No   Clinical Intake:  Pre-visit preparation completed: Yes  Pain : No/denies pain Pain Score: 0-No pain     Nutritional Status: BMI > 30  Obese Nutritional Risks: None Diabetes: No  How often do you need to have someone help you when you read instructions, pamphlets, or other written materials from your doctor or pharmacy?: 1 - Never What is the last grade level you completed in school?: 12th grade  Interpreter Needed?: No  Comments: pt is a widow and lives alone Information entered by :: LPinson, LPN  Past Medical History:  Diagnosis Date  . Ankle fracture    right  . Depression   . History of anal fissures 2013   worsened by constipation   . History of hemorrhoids    w/o complications  . History of rectal fissure   . Hyperlipidemia 04/24/2012  . Lichen sclerosus    temovate cream  . Medial meniscus tear   . Osteopenia     dexa 2013, 2016  . Trigger finger    Past Surgical History:  Procedure Laterality Date  . COLONOSCOPY  09/2003  . COLONOSCOPY  07/2016   TA, diverticulosis, rpt 5 yrs Henrene Pastor)  . dexa  04/2012   T -2.1 hip, -1.2 spine  . dexa  03/2015   T -2.4 hip, -1.7 spine  . I&D EXTREMITY Right 03/16/2014   cat bite hand s/p I&D in OR ([name])  . left hand surgery  1962   tendon laceration  . PARTIAL HYSTERECTOMY  1980   for prolapse, ovaries remain   Family History  Problem Relation Age of Onset  . Cancer Mother        leukemia  . Coronary artery disease Mother 32  . Coronary artery disease Father 26       MI  . Parkinsonism Brother 42       deceased  . Stroke Brother   . CAD Brother 57       7 stents  . Hypertension Brother   . Hypertension Sister   . Crohn's disease Sister   . Diabetes Maternal Grandfather   . CAD Other 42       stent   Social History   Socioeconomic History  . Marital status: Divorced    Spouse name: Not on file  . Number of children: Not on  file  . Years of education: Not on file  . Highest education level: Not on file  Occupational History  . Not on file  Social Needs  . Financial resource strain: Not on file  . Food insecurity:    Worry: Not on file    Inability: Not on file  . Transportation needs:    Medical: Not on file    Non-medical: Not on file  Tobacco Use  . Smoking status: Never Smoker  . Smokeless tobacco: Never Used  Substance and Sexual Activity  . Alcohol use: Yes    Comment: rare  . Drug use: No  . Sexual activity: Not on file  Lifestyle  . Physical activity:    Days per week: Not on file    Minutes per session: Not on file  . Stress: Not on file  Relationships  . Social connections:    Talks on phone: Not on file    Gets together: Not on file    Attends religious service: Not on file    Active member of club or organization: Not on file    Attends meetings of clubs or organizations: Not on file    Relationship  status: Not on file  Other Topics Concern  . Not on file  Social History Narrative   Lives alone with cat and dog.  Widow, husband deceased from [place].   Occupation: retired, prior worked in Psychologist, educational   Activity: no regular exercise, has eliptical   Diet: good water, fruits/vegetables daily    Outpatient Encounter Medications as of 06/02/2018  Medication Sig  . AMBULATORY NON FORMULARY MEDICATION Medication Name: nitroglycerin 0.125% gel to [facility]. apply a pea size amount to your rectum three times daily x 6-8 weeks.  . Calcium Carb-Cholecalciferol (CALCIUM-VITAMIN D) 600-400 MG-UNIT TABS Take 1 tablet by mouth daily.  . cetirizine (ZYRTEC) 10 MG tablet Take 10 mg by mouth daily.  . Cholecalciferol (VITAMIN D3) 1000 UNITS CAPS Take 1 capsule (1,000 Units total) by mouth daily.  Marland Kitchen ibuprofen (ADVIL,MOTRIN) 200 MG tablet Take 200 mg by mouth every 6 (six) hours as needed.  . Multiple Vitamins-Minerals (PRESERVISION AREDS 2+MULTI VIT PO) Take 2 capsules by mouth daily.  . sertraline (ZOLOFT) 50 MG tablet TAKE 1 TABLET BY MOUTH ONCE A DAY  . [DISCONTINUED] diclofenac (VOLTAREN) 75 MG EC tablet Take 1 tablet (75 mg total) by mouth 2 (two) times daily. For 7-14 days as needed   No facility-administered encounter medications on file as of 06/02/2018.     Activities of Daily Living In your present state of health, do you have any difficulty performing the following activities: 06/02/2018  Hearing? N  Vision? N  Difficulty concentrating or making decisions? N  Walking or climbing stairs? N  Dressing or bathing? N  Doing errands, shopping? N  Preparing Food and eating ? N  Using the Toilet? N  In the past six months, have you accidently leaked urine? N  Do you have problems with loss of bowel control? N  Managing your Medications? N  Managing your Finances? N  Housekeeping or managing your Housekeeping? N  Some recent data might be hidden    Patient Care Team: Ria Bush, MD as PCP - General (Family Medicine) Eulogio Bear, MD as Consulting Physician (Ophthalmology)    Assessment:   This is a routine wellness examination for Yolanda Walsh.   Hearing Screening   125Hz  250Hz  500Hz  1000Hz  2000Hz  3000Hz  4000Hz  6000Hz  8000Hz   Right ear:  40 40 40  40    Left ear:   40 40 40  40    Vision Screening Comments: Vision exam in Nov 2019 with Dr. Edison Pace   Exercise Activities and Dietary recommendations Current Exercise Habits: Home exercise routine, Type of exercise: walking, Time (Minutes): 30, Frequency (Times/Week): 7, Weekly Exercise (Minutes/Week): 210, Intensity: Moderate, Exercise limited by: None identified  Goals    . Increase physical activity     Starting 06/02/2018 and weather permitting, I will continue to walk 1-2 miles daily.         Fall Risk Fall Risk  06/02/2018 06/01/2017 05/27/2016  Falls in the past year? 0 No Yes  Number falls in past yr: - - 1  Injury with Fall? - - Yes  Comment - - Broken ankle   Depression Screen PHQ 2/9 Scores 06/02/2018 06/01/2017 05/27/2016  PHQ - 2 Score 0 1 0  PHQ- 9 Score 0 1 -     Cognitive Function MMSE - Mini Mental State Exam 06/02/2018 06/01/2017  Orientation to time 5 5  Orientation to Place 5 5  Registration 3 3  Attention/ Calculation 0 0  Recall 3 3  Language- name 2 objects 0 0  Language- repeat 1 1  Language- follow 3 step command 3 3  Language- read & follow direction 0 0  Write a sentence 0 0  Copy design 0 0  Total score 20 20     PLEASE NOTE: A Mini-Cog screen was completed. Maximum score is 20. A value of 0 denotes this part of Folstein MMSE was not completed or the patient failed this part of the Mini-Cog screening.   Mini-Cog Screening Orientation to Time - Max 5 pts Orientation to Place - Max 5 pts Registration - Max 3 pts Recall - Max 3 pts Language Repeat - Max 1 pts Language Follow 3 Step Command - Max 3 pts     Immunization History  Administered Date(s)  Administered  . Influenza Split 04/18/2012  . Influenza Whole 04/13/2008  . Influenza,inj,Quad PF,6+ Mos 04/19/2013, 04/01/2014, 03/28/2015, 03/31/2016, 04/08/2017, 04/06/2018  . Pneumococcal Conjugate-13 05/27/2016  . Pneumococcal Polysaccharide-23 06/01/2017  . Td 03/13/1999  . Tdap 02/04/2011  . Zoster 05/23/2012  . Zoster Recombinat (Shingrix) 09/12/2017, 11/14/2017    Screening Tests Health Maintenance  Topic Date Due  . MAMMOGRAM  04/14/2019  . DTaP/Tdap/Td (2 - Td) 02/03/2021  . TETANUS/TDAP  02/03/2021  . COLONOSCOPY  07/26/2021  . INFLUENZA VACCINE  Completed  . DEXA SCAN  Completed  . Hepatitis C Screening  Completed  . PNA vac Low Risk Adult  Completed      Plan:     I have personally reviewed, addressed, and noted the following in the patient's chart:  A. Medical and social history B. Use of alcohol, tobacco or illicit drugs  C. Current medications and supplements D. Functional ability and status E.  Nutritional status F.  Physical activity G. Advance directives H. List of other physicians I.  Hospitalizations, surgeries, and ER visits in previous 12 months J.  [place] to include hearing, vision, cognitive, depression L. Referrals and appointments - none  In addition, I have reviewed and discussed with patient certain preventive protocols, quality metrics, and best practice recommendations. A written personalized care plan for preventive services as well as general preventive health recommendations were provided to patient.  See attached scanned questionnaire for additional information.   Signed,   Lindell Noe, MHA, BS, LPN Health Coach

## 2018-06-11 NOTE — Assessment & Plan Note (Signed)
Preventative protocols reviewed and updated unless pt declined. Discussed healthy diet and lifestyle.  

## 2018-06-11 NOTE — Progress Notes (Addendum)
BP 120/64 (BP Location: Left Arm, Patient Position: Sitting, Cuff Size: Normal)   Pulse 63   Temp 97.8 F (36.6 C) (Oral)   Ht 5' 5.5" (1.664 m)   Wt 190 lb (86.2 kg)   SpO2 96%   BMI 31.14 kg/m    CC: CPE Subjective:    Patient ID: Yolanda Walsh, female    DOB: 1950/08/05, 67 y.o.   MRN: [id number]  HPI: Yolanda Walsh is a 67 y.o. female presenting on 06/12/2018 for Annual Exam (Pt 2. )   Saw Katha Cabal last week for medicare wellness visit. Note reviewed.  Using new apple watch which keeps her on track for exercise.  Planning trip to [place] for Christmas.   Preventative: Colonoscopy 07/2016 - TA, diverticulosis, rpt 5 yrs Henrene Pastor).  Well woman with Dr. Marvel Plan h/o partal hysterectomy.sees yearly. Ovaries remain.  Mammogram - WNL 04/2018 DEXA 04/2012, 03/2015, 04/2017 T -2.4 hip - reviewed calicum and vit D intake, rec weight bearing exercise. Flu - yearly Prevnar 2017, pneumovax 2018 - localized reaction to this.  Tdap 2012. zostavax - 2013 shingrix - completed 2 series 2019  Advanced directive - has at home. Daughter in [place] is HCPOA. Asked to bring [place] a copy. Does not want prolonged life support. Ok with temporary measures. Seat belt use discussed Sunscreen use discussed. No changing moles on skin. Non smoker Alcohol - none Dentist - q6 mo - just had dental work (root canal) Eye exam yearly on preservision  Lives alone with cat and dog.daughter nearby. Widow, husband deceased from [place]. Brother died from [place].  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 Interim medical history since our last visit reviewed. Allergies and medications reviewed and updated. Outpatient Medications Prior to Visit  Medication Sig Dispense Refill  . AMBULATORY NON FORMULARY MEDICATION Medication  Name: nitroglycerin 0.125% gel to [facility]. apply a pea size amount to your rectum three times daily x 6-8 weeks. 30 g 0  . Calcium Carb-Cholecalciferol (CALCIUM-VITAMIN D) 600-400 MG-UNIT TABS Take 1 tablet by mouth daily.    . cetirizine (ZYRTEC) 10 MG tablet Take 10 mg by mouth daily.    . Cholecalciferol (VITAMIN D3) 1000 UNITS CAPS Take 1 capsule (1,000 Units total) by mouth daily. 30 capsule   . ibuprofen (ADVIL,MOTRIN) 200 MG tablet Take 200 mg by mouth every 6 (six) hours as needed.    . Multiple Vitamins-Minerals (PRESERVISION AREDS 2+MULTI VIT PO) Take 2 capsules by mouth daily.    . sertraline (ZOLOFT) 50 MG tablet TAKE 1 TABLET BY MOUTH ONCE A DAY 90 tablet 0   No facility-administered medications prior to visit.      Per HPI unless specifically indicated in ROS section below Review of Systems  Constitutional: Negative for activity change, appetite change, chills, fatigue, fever and unexpected weight change.  HENT: Negative for hearing loss.   Eyes: Negative for visual disturbance.  Respiratory: Negative for cough, chest tightness, shortness of breath and wheezing.   Cardiovascular: Negative for chest pain, palpitations and leg swelling.  Gastrointestinal: Negative for abdominal distention, abdominal pain, blood in stool, constipation, diarrhea, nausea and vomiting.  Genitourinary: Negative for difficulty urinating and hematuria.  Musculoskeletal: Negative for arthralgias, myalgias and neck pain.  Skin: Negative for rash.  Neurological: Negative for dizziness, seizures, syncope and headaches.  Hematological: Negative for adenopathy.  Does not bruise/bleed easily.  Psychiatric/Behavioral: Negative for dysphoric mood. The patient is not nervous/anxious.        Objective:    BP 120/64 (BP Location: Left Arm, Patient Position: Sitting, Cuff Size: Normal)   Pulse 63   Temp 97.8 F (36.6 C) (Oral)   Ht 5' 5.5" (1.664 m)   Wt 190 lb (86.2 kg)   SpO2 96%   BMI 31.14  kg/m   Wt Readings from Last 3 Encounters:  06/12/18 190 lb (86.2 kg)  06/02/18 189 lb 4 oz (85.8 kg)  06/07/17 190 lb (86.2 kg)    Physical Exam  Constitutional: She is oriented to person, place, and time. She appears well-developed and well-nourished. No distress.  HENT:  Head: Normocephalic and atraumatic.  Right Ear: Hearing, tympanic membrane, external ear and ear canal normal.  Left Ear: Hearing, tympanic membrane, external ear and ear canal normal.  Nose: Nose normal.  Mouth/Throat: Uvula is midline, oropharynx is clear and moist and mucous membranes are normal. No oropharyngeal exudate, posterior oropharyngeal edema or posterior oropharyngeal erythema.  Eyes: Pupils are equal, round, and reactive to light. Conjunctivae and EOM are normal. No scleral icterus.  Neck: Normal range of motion. Neck supple. Carotid bruit is not present. No thyromegaly present.  Cardiovascular: Normal rate, regular rhythm, normal heart sounds and intact distal pulses.  No murmur heard. Pulses:      Radial pulses are 2+ on the right side, and 2+ on the left side.  Pulmonary/Chest: Effort normal and breath sounds normal. No respiratory distress. She has no wheezes. She has no rales.  Abdominal: Soft. Bowel sounds are normal. She exhibits no distension and no mass. There is no tenderness. There is no rebound and no guarding.  Musculoskeletal: Normal range of motion. She exhibits no edema.  Lymphadenopathy:    She has no cervical adenopathy.  Neurological: She is alert and oriented to person, place, and time.  CN grossly intact, station and gait intact  Skin: Skin is warm and dry. No rash noted.  Psychiatric: She has a normal mood and affect. Her behavior is normal. Judgment and thought content normal.  Nursing note and vitals reviewed.  Results for orders placed or performed in visit on 06/02/18  VITAMIN D 25 Hydroxy (Vit-D Deficiency, Fractures)  Result Value Ref Range   VITD 23.14 (L) 30.00 -  100.00 ng/mL  Comprehensive metabolic panel  Result Value Ref Range   Sodium 139 135 - 145 mEq/L   Potassium 4.2 3.5 - 5.1 mEq/L   Chloride 104 96 - 112 mEq/L   CO2 28 19 - 32 mEq/L   Glucose, Bld 100 (H) 70 - 99 mg/dL   BUN 16 6 - 23 mg/dL   Creatinine, Ser 0.85 0.40 - 1.20 mg/dL   Total Bilirubin 0.5 0.2 - 1.2 mg/dL   Alkaline Phosphatase 70 39 - 117 U/L   AST 16 0 - 37 U/L   ALT 10 0 - 35 U/L   Total Protein 7.3 6.0 - 8.3 g/dL   Albumin 4.2 3.5 - 5.2 g/dL   Calcium 9.4 8.4 - 10.5 mg/dL   GFR 70.78 >60.00 mL/min  Lipid panel  Result Value Ref Range   Cholesterol 249 (H) 0 - 200 mg/dL   Triglycerides 81.0 0.0 - 149.0 mg/dL   HDL 50.20 >39.00 mg/dL   VLDL 16.2 0.0 - 40.0 mg/dL   LDL Cholesterol 182 (H) 0 - 99 mg/dL   Total CHOL/HDL Ratio 5    NonHDL 198.65  Assessment & Plan:   Problem List Items Addressed This Visit    Vitamin D deficiency   Osteopenia    Consider updated dexa 2020. Encouraged compliance with cal/vit D Anticipate regular walking routine with help.      Obesity, Class I, BMI 30-34.9    Continue to encourage healthy diet and lifestyle changes to affect sustainable weight loss. Obesity contributes to HLD.       Hyperlipidemia    Chronic, not on statin. Significant fmhx. Hesitant for statins, agrees to start crestor 5mg  daily with option to decrease frequency if needed. 25yr risk does not take into account strong family history.  The 10-year ASCVD risk score Mikey Bussing DC Brooke Bonito., et al., 2013) is: 6.9%   Values used to calculate the score:     Age: 36 years     Sex: Female     Is Non-Hispanic African American: No     Diabetic: No     Tobacco smoker: No     Systolic Blood Pressure: 831 mmHg     Is BP treated: No     HDL Cholesterol: 50.2 mg/dL     Total Cholesterol: 249 mg/dL       Relevant Medications   rosuvastatin (CRESTOR) 5 MG tablet   Other Relevant Orders   Hepatic function panel   Lipid panel   Healthcare maintenance - Primary     Preventative protocols reviewed and updated unless pt declined. Discussed healthy diet and lifestyle.       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 Continue preservision.       Anxiety    Stable period on sertraline - this has significantly improved irritability.       Advanced care planning/counseling discussion    Advanced directive - has at home. Daughter in [place] is HCPOA. Asked to bring [place] a copy. Does not want prolonged life support. Ok with temporary measures.          Meds ordered this encounter  Medications  . rosuvastatin (CRESTOR) 5 MG tablet    Sig: Take 1 tablet (5 mg total) by mouth daily.    Dispense:  30 tablet    Refill:  11   Orders Placed This Encounter  Procedures  . Hepatic function panel    Standing Status:   Future    Standing Expiration Date:   06/13/2019  . Lipid panel    Standing Status:   Future    Standing Expiration Date:   06/13/2019    Follow up plan: Return in about 1 year (around 06/13/2019) for annual exam, prior fasting for blood work, medicare wellness visit.  Ria Bush, MD

## 2018-06-12 ENCOUNTER — Ambulatory Visit (INDEPENDENT_AMBULATORY_CARE_PROVIDER_SITE_OTHER): Payer: PPO | Admitting: Family Medicine

## 2018-06-12 ENCOUNTER — Encounter: Payer: Self-pay | Admitting: Family Medicine

## 2018-06-12 VITALS — BP 120/64 | HR 63 | Temp 97.8°F | Ht 65.5 in | Wt 190.0 lb

## 2018-06-12 DIAGNOSIS — Z7189 Other specified counseling: Secondary | ICD-10-CM | POA: Diagnosis not present

## 2018-06-12 DIAGNOSIS — M8580 Other specified disorders of bone density and structure, unspecified site: Secondary | ICD-10-CM

## 2018-06-12 DIAGNOSIS — H3530 Unspecified macular degeneration: Secondary | ICD-10-CM | POA: Diagnosis not present

## 2018-06-12 DIAGNOSIS — E66811 Obesity, class 1: Secondary | ICD-10-CM

## 2018-06-12 DIAGNOSIS — E785 Hyperlipidemia, unspecified: Secondary | ICD-10-CM | POA: Diagnosis not present

## 2018-06-12 DIAGNOSIS — F419 Anxiety disorder, unspecified: Secondary | ICD-10-CM

## 2018-06-12 DIAGNOSIS — E559 Vitamin D deficiency, unspecified: Secondary | ICD-10-CM | POA: Insufficient documentation

## 2018-06-12 DIAGNOSIS — Z0000 Encounter for general adult medical examination without abnormal findings: Secondary | ICD-10-CM

## 2018-06-12 DIAGNOSIS — E669 Obesity, unspecified: Secondary | ICD-10-CM

## 2018-06-12 MED ORDER — ROSUVASTATIN CALCIUM 5 MG PO TABS: 5.0000 mg | ORAL_TABLET | Freq: Every day | ORAL | 11 refills | Status: DC

## 2018-06-12 NOTE — Assessment & Plan Note (Signed)
Continue to encourage healthy diet and lifestyle changes to affect sustainable weight loss. Obesity contributes to HLD.

## 2018-06-12 NOTE — Assessment & Plan Note (Addendum)
Consider updated dexa 2020. Encouraged compliance with cal/vit D Anticipate regular walking routine with help.

## 2018-06-12 NOTE — Addendum Note (Signed)
Addended by: Ria Bush on: 06/12/2018 11:23 AM   Modules accepted: Orders

## 2018-06-12 NOTE — Assessment & Plan Note (Addendum)
Chronic, not on statin. Significant fmhx. Hesitant for statins, agrees to start crestor 5mg  daily with option to decrease frequency if needed. 21yr risk does not take into account strong family history.  The 10-year ASCVD risk score Yolanda Walsh Yolanda Walsh Yolanda Bonito., et al., 2013) is: 6.9%   Values used to calculate the score:     Age: 67 years     Sex: Female     Is Non-Hispanic African American: No     Diabetic: No     Tobacco smoker: No     Systolic Blood Pressure: 875 mmHg     Is BP treated: No     HDL Cholesterol: 50.2 mg/dL     Total Cholesterol: 249 mg/dL

## 2018-06-12 NOTE — Assessment & Plan Note (Addendum)
Stable period on sertraline - this has significantly improved irritability.

## 2018-06-12 NOTE — Assessment & Plan Note (Signed)
Sees eye doctor regularly. Continue preservision.

## 2018-06-12 NOTE — Assessment & Plan Note (Signed)
Advanced directive - has at home. Daughter in [place] is HCPOA. Asked to bring [place] a copy. Does not want prolonged life support. Ok with temporary measures.

## 2018-06-12 NOTE — Patient Instructions (Addendum)
Bring [place] copy of your advanced directives to update your chart. Start crestor '5mg'$  for cholesterol. Return in 3 months for lab visit only to recheck cholesterol levels.  You are doing well today.  Enjoy [place]! Return sa needed or in 3 months for labs only, 1 year for physical. Health Maintenance, Female Adopting a healthy lifestyle and getting preventive care can go a long way to promote health and wellness. Talk with your health care provider about what schedule of regular examinations is right for you. This is a good chance for you to check in with your provider about disease prevention and staying healthy. In between checkups, there are plenty of things you can do on your own. Experts have done a lot of research about which lifestyle changes and preventive measures are most likely to keep you healthy. Ask your health care provider for more information. Weight and diet Eat a healthy diet  Be sure to include plenty of vegetables, fruits, low-fat dairy products, and lean protein.  Do not eat a lot of foods high in solid fats, added sugars, or salt.  Get regular exercise. This is one of the most important things you can do for your health. ? Most adults should exercise for at least 150 minutes each week. The exercise should increase your heart rate and make you sweat (moderate-intensity exercise). ? Most adults should also do strengthening exercises at least twice a week. This is in addition to the moderate-intensity exercise.  Maintain a healthy weight  Body mass index (BMI) is a measurement that can be used to identify possible weight problems. It estimates body fat based on height and weight. Your health care provider can help determine your BMI and help you achieve or maintain a healthy weight.  For females 63 years of age and older: ? A BMI below 18.5 is considered underweight. ? A BMI of 18.5 to 24.9 is normal. ? A BMI of 25 to 29.9 is considered overweight. ? A BMI of 30 and above is  considered obese.  Watch levels of cholesterol and blood lipids  You should start having your blood tested for lipids and cholesterol at 67 years of age, then have this test every 5 years.  You may need to have your cholesterol levels checked more often if: ? Your lipid or cholesterol levels are high. ? You are older than 67 years of age. ? You are at high risk for heart disease.  Cancer screening Lung Cancer  Lung cancer screening is recommended for adults 28-60 years old who are at high risk for lung cancer because of a history of smoking.  A yearly low-dose CT scan of the lungs is recommended for people who: ? Currently smoke. ? Have quit within the past 15 years. ? Have at least a 30-pack-year history of smoking. A pack year is smoking an average of one pack of cigarettes a day for 1 year.  Yearly screening should continue until it has been 15 years since you quit.  Yearly screening should stop if you develop a health problem that would prevent you from having lung cancer treatment.  Breast Cancer  Practice breast self-awareness. This means understanding how your breasts normally appear and feel.  It also means doing regular breast self-exams. Let your health care provider know about any changes, no matter how small.  If you are in your 20s or 30s, you should have a clinical breast exam (CBE) by a health care provider every 1-3 years as part of  a regular health exam.  If you are 19 or older, have a CBE every year. Also consider having a breast X-ray (mammogram) every year.  If you have a family history of breast cancer, talk to your health care provider about genetic screening.  If you are at high risk for breast cancer, talk to your health care provider about having an MRI and a mammogram every year.  Breast cancer gene (BRCA) assessment is recommended for women who have family members with BRCA-related cancers. BRCA-related cancers  include: ? Breast. ? Ovarian. ? Tubal. ? Peritoneal cancers.  Results of the assessment will determine the need for genetic counseling and BRCA1 and BRCA2 testing.  Cervical Cancer Your health care provider may recommend that you be screened regularly for cancer of the pelvic organs (ovaries, uterus, and vagina). This screening involves a pelvic examination, including checking for microscopic changes to the surface of your cervix (Pap test). You may be encouraged to have this screening done every 3 years, beginning at age 34.  For women ages 4-65, health care providers may recommend pelvic exams and Pap testing every 3 years, or they may recommend the Pap and pelvic exam, combined with testing for human papilloma virus (HPV), every 5 years. Some types of HPV increase your risk of cervical cancer. Testing for HPV may also be done on women of any age with unclear Pap test results.  Other health care providers may not recommend any screening for nonpregnant women who are considered low risk for pelvic cancer and who do not have symptoms. Ask your health care provider if a screening pelvic exam is right for you.  If you have had past treatment for cervical cancer or a condition that could lead to cancer, you need Pap tests and screening for cancer for at least 20 years after your treatment. If Pap tests have been discontinued, your risk factors (such as having a new sexual partner) need to be reassessed to determine if screening should resume. Some women have medical problems that increase the chance of getting cervical cancer. In these cases, your health care provider may recommend more frequent screening and Pap tests.  Colorectal Cancer  This type of cancer can be detected and often prevented.  Routine colorectal cancer screening usually begins at 67 years of age and continues through 67 years of age.  Your health care provider may recommend screening at an earlier age if you have risk factors  for colon cancer.  Your health care provider may also recommend using home test kits to check for hidden blood in the stool.  A small camera at the end of a tube can be used to examine your colon directly (sigmoidoscopy or colonoscopy). This is done to check for the earliest forms of colorectal cancer.  Routine screening usually begins at age 37.  Direct examination of the colon should be repeated every 5-10 years through 67 years of age. However, you may need to be screened more often if early forms of precancerous polyps or small growths are found.  Skin Cancer  Check your skin from head to toe regularly.  Tell your health care provider about any new moles or changes in moles, especially if there is a change in a mole's shape or color.  Also tell your health care provider if you have a mole that is larger than the size of a pencil eraser.  Always use sunscreen. Apply sunscreen liberally and repeatedly throughout the day.  Protect yourself by wearing long sleeves,  pants, a wide-brimmed hat, and sunglasses whenever you are outside.  Heart disease, diabetes, and high blood pressure  High blood pressure causes heart disease and increases the risk of stroke. High blood pressure is more likely to develop in: ? People who have blood pressure in the high end of the normal range (130-139/85-89 mm Hg). ? People who are overweight or obese. ? People who are African American.  If you are 18-39 years of age, have your blood pressure checked every 3-5 years. If you are 40 years of age or older, have your blood pressure checked every year. You should have your blood pressure measured twice-once when you are at a hospital or clinic, and once when you are not at a hospital or clinic. Record the average of the two measurements. To check your blood pressure when you are not at a hospital or clinic, you can use: ? An automated blood pressure machine at a pharmacy. ? A home blood pressure monitor.  If  you are between 55 years and 79 years old, ask your health care provider if you should take aspirin to prevent strokes.  Have regular diabetes screenings. This involves taking a blood sample to check your fasting blood sugar level. ? If you are at a normal weight and have a low risk for diabetes, have this test once every three years after 67 years of age. ? If you are overweight and have a high risk for diabetes, consider being tested at a younger age or more often. Preventing infection Hepatitis B  If you have a higher risk for hepatitis B, you should be screened for this virus. You are considered at high risk for hepatitis B if: ? You were born in a country where hepatitis B is common. Ask your health care provider which countries are considered high risk. ? Your parents were born in a high-risk country, and you have not been immunized against hepatitis B (hepatitis B vaccine). ? You have HIV or AIDS. ? You use needles to inject street drugs. ? You live with someone who has hepatitis B. ? You have had sex with someone who has hepatitis B. ? You get hemodialysis treatment. ? You take certain medicines for conditions, including cancer, organ transplantation, and autoimmune conditions.  Hepatitis C  Blood testing is recommended for: ? Everyone born from 1945 through 1965. ? Anyone with known risk factors for hepatitis C.  Sexually transmitted infections (STIs)  You should be screened for sexually transmitted infections (STIs) including gonorrhea and chlamydia if: ? You are sexually active and are younger than 67 years of age. ? You are older than 67 years of age and your health care provider tells you that you are at risk for this type of infection. ? Your sexual activity has changed since you were last screened and you are at an increased risk for chlamydia or gonorrhea. Ask your health care provider if you are at risk.  If you do not have HIV, but are at risk, it may be recommended  that you take a prescription medicine daily to prevent HIV infection. This is called pre-exposure prophylaxis (PrEP). You are considered at risk if: ? You are sexually active and do not regularly use condoms or know the HIV status of your partner(s). ? You take drugs by injection. ? You are sexually active with a partner who has HIV.  Talk with your health care provider about whether you are at high risk of being infected with HIV. If you   choose to begin PrEP, you should first be tested for HIV. You should then be tested every 3 months for as long as you are taking PrEP. Pregnancy  If you are premenopausal and you may become pregnant, ask your health care provider about preconception counseling.  If you may become pregnant, take 400 to 800 micrograms (mcg) of folic acid every day.  If you want to prevent pregnancy, talk to your health care provider about birth control (contraception). Osteoporosis and menopause  Osteoporosis is a disease in which the bones lose minerals and strength with aging. This can result in serious bone fractures. Your risk for osteoporosis can be identified using a bone density scan.  If you are 36 years of age or older, or if you are at risk for osteoporosis and fractures, ask your health care provider if you should be screened.  Ask your health care provider whether you should take a calcium or vitamin D supplement to lower your risk for osteoporosis.  Menopause may have certain physical symptoms and risks.  Hormone replacement therapy may reduce some of these symptoms and risks. Talk to your health care provider about whether hormone replacement therapy is right for you. Follow these instructions at home:  Schedule regular health, dental, and eye exams.  Stay current with your immunizations.  Do not use any tobacco products including cigarettes, chewing tobacco, or electronic cigarettes.  If you are pregnant, do not drink alcohol.  If you are  breastfeeding, limit how much and how often you drink alcohol.  Limit alcohol intake to no more than 1 drink per day for nonpregnant women. One drink equals 12 ounces of beer, 5 ounces of wine, or 1 ounces of hard liquor.  Do not use street drugs.  Do not share needles.  Ask your health care provider for help if you need support or information about quitting drugs.  Tell your health care provider if you often feel depressed.  Tell your health care provider if you have ever been abused or do not feel safe at home. This information is not intended to replace advice given to you by your health care provider. Make sure you discuss any questions you have with your health care provider. Document Released: 01/11/2011 Document Revised: 12/04/2015 Document Reviewed: 04/01/2015 Elsevier Interactive Patient Education  [employer].

## 2018-07-09 NOTE — Progress Notes (Signed)
I reviewed health advisor's note, was available for consultation, and agree with documentation and plan.  

## 2019-01-17 ENCOUNTER — Other Ambulatory Visit: Payer: Self-pay | Admitting: Family Medicine

## 2019-02-27 ENCOUNTER — Ambulatory Visit (INDEPENDENT_AMBULATORY_CARE_PROVIDER_SITE_OTHER): Payer: PPO | Admitting: Family Medicine

## 2019-02-27 ENCOUNTER — Encounter: Payer: Self-pay | Admitting: Family Medicine

## 2019-02-27 VITALS — HR 74 | Temp 97.2°F | Ht 65.5 in | Wt 187.0 lb

## 2019-02-27 DIAGNOSIS — E785 Hyperlipidemia, unspecified: Secondary | ICD-10-CM | POA: Diagnosis not present

## 2019-02-27 DIAGNOSIS — R55 Syncope and collapse: Secondary | ICD-10-CM

## 2019-02-27 NOTE — Progress Notes (Signed)
Virtual visit completed through [employer]. Due to national recommendations of social distancing due to COVID-19, a virtual visit is felt to be most appropriate for this patient at this time. Reviewed limitations of a virtual visit.   Patient location: home Provider location:  at [facility], office If any vitals were documented, they were collected by patient at home unless specified below.    Pulse 74   Temp (!) 97.2 F (36.2 C)   Ht 5' 5.5" (1.664 m)   Wt 187 lb (84.8 kg)   BMI 30.65 kg/m    CC: dizziness Subjective:    Patient ID: Yolanda Walsh, female    DOB: 09/05/1950, 68 y.o.   MRN: [id number]  HPI: Yolanda Walsh is a 68 y.o. female presenting on 02/27/2019 for Dizziness (C/o dizziness and nausea.  Started 02/24/19.  Thinks she may have gotten over heated. )   Stressful last few months. Lives alone with cat.   3d ago while helping her sister up after a fall, patient felt presyncopal and had to sit down. Family members present said she actually passed out for a bit. She does not remember this, but felt disoriented for 10-15 min. No seizure activity noted. She did drive home and slept for 3 hours. Has felt fatigued since then. HR was normal during this episode (60s). Unable to check blood pressures.   Denies headache, palpitations, chest pain/tightness, dyspnea. Denies unilateral weakness/numbness or slurred speech. No true vertigo. Chronic tinnitus.   Strong fmhx CAD, stroke (siblings, parents).  HLD - stopped crestor 08/2018 due to myalgias.      Relevant past medical, surgical, family and social history reviewed and updated as indicated. Interim medical history since our last visit reviewed. Allergies and medications reviewed and updated. Outpatient Medications Prior to Visit  Medication Sig Dispense Refill  . AMBULATORY NON FORMULARY MEDICATION Medication Name: nitroglycerin 0.125% gel to [facility]. apply a pea size amount to your rectum three times  daily x 6-8 weeks. 30 g 0  . cetirizine (ZYRTEC) 10 MG tablet Take 10 mg by mouth daily.    . Cholecalciferol (VITAMIN D3) 1000 UNITS CAPS Take 1 capsule (1,000 Units total) by mouth daily. 30 capsule   . ibuprofen (ADVIL,MOTRIN) 200 MG tablet Take 200 mg by mouth every 6 (six) hours as needed.    . Multiple Vitamins-Minerals (PRESERVISION AREDS 2+MULTI VIT PO) Take 2 capsules by mouth daily.    . sertraline (ZOLOFT) 50 MG tablet TAKE 1 TABLET BY MOUTH ONCE A DAY 90 tablet 1  . rosuvastatin (CRESTOR) 5 MG tablet Take 1 tablet (5 mg total) by mouth daily. (Patient not taking: Reported on 02/27/2019) 30 tablet 11  . Calcium Carb-Cholecalciferol (CALCIUM-VITAMIN D) 600-400 MG-UNIT TABS Take 1 tablet by mouth daily.     No facility-administered medications prior to visit.      Per HPI unless specifically indicated in ROS section below Review of Systems Objective:    Pulse 74   Temp (!) 97.2 F (36.2 C)   Ht 5' 5.5" (1.664 m)   Wt 187 lb (84.8 kg)   BMI 30.65 kg/m   Wt Readings from Last 3 Encounters:  02/27/19 187 lb (84.8 kg)  06/12/18 190 lb (86.2 kg)  06/02/18 189 lb 4 oz (85.8 kg)     Physical exam: Gen: alert, NAD, not ill appearing Pulm: speaks in complete sentences without increased work of breathing Psych: normal mood, normal thought content      Results for orders  placed or performed in visit on 06/02/18  VITAMIN D 25 Hydroxy (Vit-D Deficiency, Fractures)  Result Value Ref Range   VITD 23.14 (L) 30.00 - 100.00 ng/mL  Comprehensive metabolic panel  Result Value Ref Range   Sodium 139 135 - 145 mEq/L   Potassium 4.2 3.5 - 5.1 mEq/L   Chloride 104 96 - 112 mEq/L   CO2 28 19 - 32 mEq/L   Glucose, Bld 100 (H) 70 - 99 mg/dL   BUN 16 6 - 23 mg/dL   Creatinine, Ser 0.85 0.40 - 1.20 mg/dL   Total Bilirubin 0.5 0.2 - 1.2 mg/dL   Alkaline Phosphatase 70 39 - 117 U/L   AST 16 0 - 37 U/L   ALT 10 0 - 35 U/L   Total Protein 7.3 6.0 - 8.3 g/dL   Albumin 4.2 3.5 - 5.2 g/dL    Calcium 9.4 8.4 - 10.5 mg/dL   GFR 70.78 >60.00 mL/min  Lipid panel  Result Value Ref Range   Cholesterol 249 (H) 0 - 200 mg/dL   Triglycerides 81.0 0.0 - 149.0 mg/dL   HDL 50.20 >39.00 mg/dL   VLDL 16.2 0.0 - 40.0 mg/dL   LDL Cholesterol 182 (H) 0 - 99 mg/dL   Total CHOL/HDL Ratio 5    NonHDL 198.65    Assessment & Plan:   Problem List Items Addressed This Visit    Pre-syncope - Primary    Describes presyncope vs syncope. Unclear story. I did recommend in office evaluation and likely further workup to r/o stroke vs other. Strong fmhx. Advised let us know sooner or seek urgent care if recurrent symptoms in the interim.       Hyperlipidemia    Marked. Did not tolerate daily crestor so she stopped this 6 months ago. Did not let me know. Advised to restart crestor 5mg  once weekly then titrate up as tolerated.           No orders of the defined types were placed in this encounter.  No orders of the defined types were placed in this encounter.   I discussed the assessment and treatment plan with the patient. The patient was provided an opportunity to ask questions and all were answered. The patient agreed with the plan and demonstrated an understanding of the instructions. The patient was advised to call back or seek an in-person evaluation if the symptoms worsen or if the condition fails to improve as anticipated.  Follow up plan: Return in about 2 days (around 03/01/2019).  Ria Bush, MD

## 2019-02-27 NOTE — Assessment & Plan Note (Addendum)
Describes presyncope vs syncope. Unclear story. I did recommend in office evaluation and likely further workup to r/o stroke vs other. Strong fmhx. Advised let us know sooner or seek urgent care if recurrent symptoms in the interim.

## 2019-02-27 NOTE — Assessment & Plan Note (Signed)
Marked. Did not tolerate daily crestor so she stopped this 6 months ago. Did not let me know. Advised to restart crestor 5mg  once weekly then titrate up as tolerated.

## 2019-03-01 ENCOUNTER — Other Ambulatory Visit: Payer: Self-pay

## 2019-03-01 ENCOUNTER — Ambulatory Visit (INDEPENDENT_AMBULATORY_CARE_PROVIDER_SITE_OTHER): Payer: PPO | Admitting: Family Medicine

## 2019-03-01 ENCOUNTER — Encounter: Payer: Self-pay | Admitting: Family Medicine

## 2019-03-01 VITALS — BP 124/70 | HR 72 | Temp 97.7°F | Ht 65.5 in | Wt 186.6 lb

## 2019-03-01 DIAGNOSIS — R55 Syncope and collapse: Secondary | ICD-10-CM | POA: Diagnosis not present

## 2019-03-01 DIAGNOSIS — Z23 Encounter for immunization: Secondary | ICD-10-CM

## 2019-03-01 LAB — CBC WITH DIFFERENTIAL/PLATELET
Basophils Absolute: 0 10*3/uL (ref 0.0–0.1)
Basophils Relative: 0.5 % (ref 0.0–3.0)
Eosinophils Absolute: 0.1 10*3/uL (ref 0.0–0.7)
Eosinophils Relative: 0.8 % (ref 0.0–5.0)
HCT: 38.3 % (ref 36.0–46.0)
Hemoglobin: 12.5 g/dL (ref 12.0–15.0)
Lymphocytes Relative: 26.3 % (ref 12.0–46.0)
Lymphs Abs: 1.7 10*3/uL (ref 0.7–4.0)
MCHC: 32.6 g/dL (ref 30.0–36.0)
MCV: 83.5 fl (ref 78.0–100.0)
Monocytes Absolute: 0.5 10*3/uL (ref 0.1–1.0)
Monocytes Relative: 7.5 % (ref 3.0–12.0)
Neutro Abs: 4.3 10*3/uL (ref 1.4–7.7)
Neutrophils Relative %: 64.9 % (ref 43.0–77.0)
Platelets: 257 10*3/uL (ref 150.0–400.0)
RBC: 4.59 Mil/uL (ref 3.87–5.11)
RDW: 15.3 % (ref 11.5–15.5)
WBC: 6.6 10*3/uL (ref 4.0–10.5)

## 2019-03-01 LAB — BASIC METABOLIC PANEL
BUN: 12 mg/dL (ref 6–23)
CO2: 27 mEq/L (ref 19–32)
Calcium: 9.8 mg/dL (ref 8.4–10.5)
Chloride: 103 mEq/L (ref 96–112)
Creatinine, Ser: 0.86 mg/dL (ref 0.40–1.20)
GFR: 65.55 mL/min (ref >60.00)
Glucose, Bld: 108 mg/dL — ABNORMAL HIGH (ref 70–99)
Potassium: 4.6 mEq/L (ref 3.5–5.1)
Sodium: 138 mEq/L (ref 135–145)

## 2019-03-01 LAB — TSH: TSH: 2.46 u[IU]/mL (ref 0.35–4.50)

## 2019-03-01 MED ORDER — ROSUVASTATIN CALCIUM 5 MG PO TABS: 5.0000 mg | ORAL_TABLET | ORAL | 3 refills | Status: DC

## 2019-03-01 NOTE — Assessment & Plan Note (Addendum)
Overall reassuring exam today, EKG stable, orthostatic vital signs normal. Reassurance provided. Anticipate combination of heat exposure and dehydration. Check labs today. She will let us know if recurrent symptoms for further eval for presyncope.

## 2019-03-01 NOTE — Progress Notes (Signed)
This visit was conducted in person.  BP 124/70 (BP Location: Left Arm, Patient Position: Sitting, Cuff Size: Normal)   Pulse 72   Temp 97.7 F (36.5 C) (Temporal)   Ht 5' 5.5" (1.664 m)   Wt 186 lb 9 oz (84.6 kg)   SpO2 97%   BMI 30.57 kg/m    Orthostatic VS for the past 24 hrs (Last 3 readings):  BP- Lying BP- Standing at 0 minutes  03/01/19 1250 - 118/76  03/01/19 1249 120/76 -   CC: f/u Subjective:    Patient ID: Yolanda Walsh, female    DOB: 11/15/1950, 68 y.o.   MRN: [id number]  HPI: Yolanda Walsh is a 68 y.o. female presenting on 03/01/2019 for Follow-up (Here for 2 day f/u.)    See prior note for details.  Presyncope vs syncope event last week - I asked her to come in today for exam and further evaluation. She did feel lightheaded prior to episode.   From prior note:  3d ago while helping her sister up after a fall, patient felt presyncopal and had to sit down. Family members present said she actually passed out for a bit. She does not remember this, but felt disoriented for 10-15 min. No seizure activity noted. She did drive home and slept for 3 hours. Has felt fatigued since then. HR was normal during this episode (60s). Unable to check blood pressures.   Denies headache, palpitations, chest pain/tightness, dyspnea. Denies unilateral weakness/numbness or slurred speech. No true vertigo. Chronic tinnitus.   Strong fmhx CAD, stroke (siblings, parents).  She had stopped crestor 08/2018 due to myalgias.   HLD - advised to restart crestor at highest dose tolerated (she had stopped).      Relevant past medical, surgical, family and social history reviewed and updated as indicated. Interim medical history since our last visit reviewed. Allergies and medications reviewed and updated. Outpatient Medications Prior to Visit  Medication Sig Dispense Refill  . AMBULATORY NON FORMULARY MEDICATION Medication Name: nitroglycerin 0.125% gel to [facility]. apply a  pea size amount to your rectum three times daily x 6-8 weeks. 30 g 0  . cetirizine (ZYRTEC) 10 MG tablet Take 10 mg by mouth daily.    . Cholecalciferol (VITAMIN D3) 1000 UNITS CAPS Take 1 capsule (1,000 Units total) by mouth daily. 30 capsule   . ibuprofen (ADVIL,MOTRIN) 200 MG tablet Take 200 mg by mouth every 6 (six) hours as needed.    . Multiple Vitamins-Minerals (PRESERVISION AREDS 2+MULTI VIT PO) Take 2 capsules by mouth daily.    . sertraline (ZOLOFT) 50 MG tablet TAKE 1 TABLET BY MOUTH ONCE A DAY 90 tablet 1  . rosuvastatin (CRESTOR) 5 MG tablet Take 1 tablet (5 mg total) by mouth daily. 30 tablet 11   No facility-administered medications prior to visit.      Per HPI unless specifically indicated in ROS section below Review of Systems Objective:    BP 124/70 (BP Location: Left Arm, Patient Position: Sitting, Cuff Size: Normal)   Pulse 72   Temp 97.7 F (36.5 C) (Temporal)   Ht 5' 5.5" (1.664 m)   Wt 186 lb 9 oz (84.6 kg)   SpO2 97%   BMI 30.57 kg/m   Wt Readings from Last 3 Encounters:  03/01/19 186 lb 9 oz (84.6 kg)  02/27/19 187 lb (84.8 kg)  06/12/18 190 lb (86.2 kg)    Physical Exam Vitals signs and nursing note reviewed.  Constitutional:  General: She is not in acute distress.    Appearance: Normal appearance. She is well-developed. She is not ill-appearing.  HENT:     Head: Normocephalic and atraumatic.     Mouth/Throat:     Pharynx: No oropharyngeal exudate or posterior oropharyngeal erythema.  Eyes:     General: No scleral icterus.    Extraocular Movements: Extraocular movements intact.     Conjunctiva/sclera: Conjunctivae normal.     Pupils: Pupils are equal, round, and reactive to light.  Neck:     Musculoskeletal: Normal range of motion and neck supple.     Vascular: No carotid bruit.  Cardiovascular:     Rate and Rhythm: Normal rate and regular rhythm.     Pulses: Normal pulses.     Heart sounds: Normal heart sounds. No murmur.  Pulmonary:      Effort: Pulmonary effort is normal. No respiratory distress.     Breath sounds: Normal breath sounds. No wheezing, rhonchi or rales.  Lymphadenopathy:     Cervical: No cervical adenopathy.  Skin:    General: Skin is warm and dry.     Findings: No rash.  Neurological:     General: No focal deficit present.     Mental Status: She is alert. Mental status is at baseline.     Cranial Nerves: Cranial nerves are intact.     Sensory: Sensation is intact.     Motor: Motor function is intact.     Coordination: Coordination is intact.     Comments:  CN 2-12 intact FTN intact EOMI  Psychiatric:        Mood and Affect: Mood normal.        Behavior: Behavior normal.       Results for orders placed or performed in visit on 06/02/18  VITAMIN D 25 Hydroxy (Vit-D Deficiency, Fractures)  Result Value Ref Range   VITD 23.14 (L) 30.00 - 100.00 ng/mL  Comprehensive metabolic panel  Result Value Ref Range   Sodium 139 135 - 145 mEq/L   Potassium 4.2 3.5 - 5.1 mEq/L   Chloride 104 96 - 112 mEq/L   CO2 28 19 - 32 mEq/L   Glucose, Bld 100 (H) 70 - 99 mg/dL   BUN 16 6 - 23 mg/dL   Creatinine, Ser 0.85 0.40 - 1.20 mg/dL   Total Bilirubin 0.5 0.2 - 1.2 mg/dL   Alkaline Phosphatase 70 39 - 117 U/L   AST 16 0 - 37 U/L   ALT 10 0 - 35 U/L   Total Protein 7.3 6.0 - 8.3 g/dL   Albumin 4.2 3.5 - 5.2 g/dL   Calcium 9.4 8.4 - 10.5 mg/dL   GFR 70.78 >60.00 mL/min  Lipid panel  Result Value Ref Range   Cholesterol 249 (H) 0 - 200 mg/dL   Triglycerides 81.0 0.0 - 149.0 mg/dL   HDL 50.20 >39.00 mg/dL   VLDL 16.2 0.0 - 40.0 mg/dL   LDL Cholesterol 182 (H) 0 - 99 mg/dL   Total CHOL/HDL Ratio 5    NonHDL 198.65    EKG - sinus bradycardia 50s, normal axis, intervals, no acute ST/T changes, good R wave progression.  Assessment & Plan:   Problem List Items Addressed This Visit    Pre-syncope - Primary    Overall reassuring exam today, EKG stable, orthostatic vital signs normal. Reassurance provided.  Anticipate combination of heat exposure and dehydration. Check labs today. She will let us know if recurrent symptoms for further eval for presyncope.  Relevant Medications   rosuvastatin (CRESTOR) 5 MG tablet (Start on 03/02/2019)   Other Relevant Orders   EKG 12-Lead (Completed)   Basic metabolic panel   CBC with Differential/Platelet   TSH    Other Visit Diagnoses    Need for influenza vaccination       Relevant Orders   Flu Vaccine QUAD 36+ mos IM (Completed)       Meds ordered this encounter  Medications  . rosuvastatin (CRESTOR) 5 MG tablet    Sig: Take 1 tablet (5 mg total) by mouth every Monday, Wednesday, and Friday.    Dispense:  40 tablet    Refill:  3    Note new sig   Orders Placed This Encounter  Procedures  . Flu Vaccine QUAD 36+ mos IM  . Basic metabolic panel  . CBC with Differential/Platelet  . TSH  . EKG 12-Lead    Follow up plan: No follow-ups on file.  Ria Bush, MD

## 2019-03-01 NOTE — Patient Instructions (Addendum)
Orthostatic vital signs today.  EKG looking ok. Take crestor MWF.  Labs today.  Ensure staying well hydrated.  Return for physical as scheduled

## 2019-04-02 DIAGNOSIS — Z1389 Encounter for screening for other disorder: Secondary | ICD-10-CM | POA: Diagnosis not present

## 2019-04-02 DIAGNOSIS — Z01419 Encounter for gynecological examination (general) (routine) without abnormal findings: Secondary | ICD-10-CM | POA: Diagnosis not present

## 2019-04-02 DIAGNOSIS — L900 Lichen sclerosus et atrophicus: Secondary | ICD-10-CM | POA: Diagnosis not present

## 2019-04-02 DIAGNOSIS — N816 Rectocele: Secondary | ICD-10-CM | POA: Diagnosis not present

## 2019-04-02 DIAGNOSIS — Z6833 Body mass index (BMI) 33.0-33.9, adult: Secondary | ICD-10-CM | POA: Diagnosis not present

## 2019-04-19 DIAGNOSIS — Z1231 Encounter for screening mammogram for malignant neoplasm of breast: Secondary | ICD-10-CM | POA: Diagnosis not present

## 2019-04-19 LAB — HM MAMMOGRAPHY

## 2019-05-02 ENCOUNTER — Other Ambulatory Visit: Payer: Self-pay

## 2019-05-02 DIAGNOSIS — Z20822 Contact with and (suspected) exposure to covid-19: Secondary | ICD-10-CM

## 2019-05-04 LAB — NOVEL CORONAVIRUS, NAA: SARS-CoV-2, NAA: NOT DETECTED

## 2019-05-14 ENCOUNTER — Telehealth: Payer: Self-pay | Admitting: Family Medicine

## 2019-05-14 ENCOUNTER — Encounter: Payer: Self-pay | Admitting: Family Medicine

## 2019-05-14 DIAGNOSIS — M8589 Other specified disorders of bone density and structure, multiple sites: Secondary | ICD-10-CM | POA: Diagnosis not present

## 2019-05-14 DIAGNOSIS — Z90710 Acquired absence of both cervix and uterus: Secondary | ICD-10-CM | POA: Diagnosis not present

## 2019-05-14 NOTE — Telephone Encounter (Signed)
Patient dropped off order for Bone Density.  Patient had bone density done today. Please sign and fax back order. Order's in rx tower.

## 2019-05-15 NOTE — Telephone Encounter (Signed)
This has been placed in Dr Synthia Innocent in basket in his office.

## 2019-05-16 NOTE — Telephone Encounter (Signed)
Signed and in my outbox.

## 2019-06-06 ENCOUNTER — Other Ambulatory Visit: Payer: Self-pay

## 2019-06-12 ENCOUNTER — Ambulatory Visit: Payer: PPO

## 2019-06-13 ENCOUNTER — Other Ambulatory Visit: Payer: Self-pay

## 2019-06-13 ENCOUNTER — Ambulatory Visit (INDEPENDENT_AMBULATORY_CARE_PROVIDER_SITE_OTHER): Payer: PPO

## 2019-06-13 ENCOUNTER — Ambulatory Visit: Payer: PPO

## 2019-06-13 ENCOUNTER — Other Ambulatory Visit (INDEPENDENT_AMBULATORY_CARE_PROVIDER_SITE_OTHER): Payer: PPO

## 2019-06-13 DIAGNOSIS — E785 Hyperlipidemia, unspecified: Secondary | ICD-10-CM

## 2019-06-13 DIAGNOSIS — Z0000 Encounter for general adult medical examination without abnormal findings: Secondary | ICD-10-CM

## 2019-06-13 LAB — LIPID PANEL
Cholesterol: 181 mg/dL (ref 0–200)
HDL: 51.2 mg/dL (ref >39.00)
LDL Cholesterol: 111 mg/dL — ABNORMAL HIGH (ref 0–99)
NonHDL: 129.93
Total CHOL/HDL Ratio: 4
Triglycerides: 97 mg/dL (ref 0.0–149.0)
VLDL: 19.4 mg/dL (ref 0.0–40.0)

## 2019-06-13 LAB — HEPATIC FUNCTION PANEL
ALT: 12 U/L (ref 0–35)
AST: 17 U/L (ref 0–37)
Albumin: 4 g/dL (ref 3.5–5.2)
Alkaline Phosphatase: 64 U/L (ref 39–117)
Bilirubin, Direct: 0.1 mg/dL (ref 0.0–0.3)
Total Bilirubin: 0.4 mg/dL (ref 0.2–1.2)
Total Protein: 6.8 g/dL (ref 6.0–8.3)

## 2019-06-13 NOTE — Patient Instructions (Signed)
Yolanda Walsh , Thank you for taking time to come for your Medicare Wellness Visit. I appreciate your ongoing commitment to your health goals. Please review the following plan we discussed and let me know if I can assist you in the future.   Screening recommendations/referrals: Colonoscopy: Up to date, completed 07/26/2016 Mammogram: Up to date, completed 04/19/2019 Bone Density: Up to date, completed 05/14/2019 Recommended yearly ophthalmology/optometry visit for glaucoma screening and checkup Recommended yearly dental visit for hygiene and checkup  Vaccinations: Influenza vaccine: Up to date, completed 03/01/2019 Pneumococcal vaccine: Completed series Tdap vaccine: Up to date, completed 02/04/2011 Shingles vaccine: Completed series    Advanced directives: Please bring a copy of your POA (Power of [place]) and/or Living Will to your next appointment.   Conditions/risks identified: hyperlipidemia  Next appointment: 06/18/2019 @ 10:30 am    Preventive Care 65 Years and Older, Female Preventive care refers to lifestyle choices and visits with your health care provider that can promote health and wellness. What does preventive care include?  A yearly physical exam. This is also called an annual well check.  Dental exams once or twice a year.  Routine eye exams. Ask your health care provider how often you should have your eyes checked.  Personal lifestyle choices, including:  Daily care of your teeth and gums.  Regular physical activity.  Eating a healthy diet.  Avoiding tobacco and drug use.  Limiting alcohol use.  Practicing safe sex.  Taking low-dose aspirin every day.  Taking vitamin and mineral supplements as recommended by your health care provider. What happens during an annual well check? The services and screenings done by your health care provider during your annual well check will depend on your age, overall health, lifestyle risk factors, and family history of  disease. Counseling  Your health care provider may ask you questions about your:  Alcohol use.  Tobacco use.  Drug use.  Emotional well-being.  Home and relationship well-being.  Sexual activity.  Eating habits.  History of falls.  Memory and ability to understand (cognition).  Work and work Statistician.  Reproductive health. Screening  You may have the following tests or measurements:  Height, weight, and BMI.  Blood pressure.  Lipid and cholesterol levels. These may be checked every 5 years, or more frequently if you are over 69 years old.  Skin check.  Lung cancer screening. You may have this screening every year starting at age 81 if you have a 30-pack-year history of smoking and currently smoke or have quit within the past 15 years.  Fecal occult blood test (FOBT) of the stool. You may have this test every year starting at age 24.  Flexible sigmoidoscopy or colonoscopy. You may have a sigmoidoscopy every 5 years or a colonoscopy every 10 years starting at age 16.  Hepatitis C blood test.  Hepatitis B blood test.  Sexually transmitted disease (STD) testing.  Diabetes screening. This is done by checking your blood sugar (glucose) after you have not eaten for a while (fasting). You may have this done every 1-3 years.  Bone density scan. This is done to screen for osteoporosis. You may have this done starting at age 65.  Mammogram. This may be done every 1-2 years. Talk to your health care provider about how often you should have regular mammograms. Talk with your health care provider about your test results, treatment options, and if necessary, the need for more tests. Vaccines  Your health care provider may recommend certain vaccines, such as:  Influenza vaccine. This is recommended every year.  Tetanus, diphtheria, and acellular pertussis (Tdap, Td) vaccine. You may need a Td booster every 10 years.  Zoster vaccine. You may need this after age 64.   Pneumococcal 13-valent conjugate (PCV13) vaccine. One dose is recommended after age 88.  Pneumococcal polysaccharide (PPSV23) vaccine. One dose is recommended after age 32. Talk to your health care provider about which screenings and vaccines you need and how often you need them. This information is not intended to replace advice given to you by your health care provider. Make sure you discuss any questions you have with your health care provider. Document Released: 07/25/2015 Document Revised: 03/17/2016 Document Reviewed: 04/29/2015 Elsevier Interactive Patient Education  2017 [place] Prevention in the Home Falls can cause injuries. They can happen to people of all ages. There are many things you can do to make your home safe and to help prevent falls. What can I do on the outside of my home?  Regularly fix the edges of walkways and driveways and fix any cracks.  Remove anything that might make you trip as you walk through a door, such as a raised step or threshold.  Trim any bushes or trees on the path to your home.  Use bright outdoor lighting.  Clear any walking paths of anything that might make someone trip, such as rocks or tools.  Regularly check to see if handrails are loose or broken. Make sure that both sides of any steps have handrails.  Any raised decks and porches should have guardrails on the edges.  Have any leaves, snow, or ice cleared regularly.  Use sand or salt on walking paths during winter.  Clean up any spills in your garage right away. This includes oil or grease spills. What can I do in the bathroom?  Use night lights.  Install grab bars by the toilet and in the tub and shower. Do not use towel bars as grab bars.  Use non-skid mats or decals in the tub or shower.  If you need to sit down in the shower, use a plastic, non-slip stool.  Keep the floor dry. Clean up any water that spills on the floor as soon as it happens.  Remove soap  buildup in the tub or shower regularly.  Attach bath mats securely with double-sided non-slip rug tape.  Do not have throw rugs and other things on the floor that can make you trip. What can I do in the bedroom?  Use night lights.  Make sure that you have a light by your bed that is easy to reach.  Do not use any sheets or blankets that are too big for your bed. They should not hang down onto the floor.  Have a firm chair that has side arms. You can use this for support while you get dressed.  Do not have throw rugs and other things on the floor that can make you trip. What can I do in the kitchen?  Clean up any spills right away.  Avoid walking on wet floors.  Keep items that you use a lot in easy-to-reach places.  If you need to reach something above you, use a strong step stool that has a grab bar.  Keep electrical cords out of the way.  Do not use floor polish or wax that makes floors slippery. If you must use wax, use non-skid floor wax.  Do not have throw rugs and other things on the floor that  can make you trip. What can I do with my stairs?  Do not leave any items on the stairs.  Make sure that there are handrails on both sides of the stairs and use them. Fix handrails that are broken or loose. Make sure that handrails are as long as the stairways.  Check any carpeting to make sure that it is firmly attached to the stairs. Fix any carpet that is loose or worn.  Avoid having throw rugs at the top or bottom of the stairs. If you do have throw rugs, attach them to the floor with carpet tape.  Make sure that you have a light switch at the top of the stairs and the bottom of the stairs. If you do not have them, ask someone to add them for you. What else can I do to help prevent falls?  Wear shoes that:  Do not have high heels.  Have rubber bottoms.  Are comfortable and fit you well.  Are closed at the toe. Do not wear sandals.  If you use a stepladder:  Make  sure that it is fully opened. Do not climb a closed stepladder.  Make sure that both sides of the stepladder are locked into place.  Ask someone to hold it for you, if possible.  Clearly mark and make sure that you can see:  Any grab bars or handrails.  First and last steps.  Where the edge of each step is.  Use tools that help you move around (mobility aids) if they are needed. These include:  Canes.  Walkers.  Scooters.  Crutches.  Turn on the lights when you go into a dark area. Replace any light bulbs as soon as they burn out.  Set up your furniture so you have a clear path. Avoid moving your furniture around.  If any of your floors are uneven, fix them.  If there are any pets around you, be aware of where they are.  Review your medicines with your doctor. Some medicines can make you feel dizzy. This can increase your chance of falling. Ask your doctor what other things that you can do to help prevent falls. This information is not intended to replace advice given to you by your health care provider. Make sure you discuss any questions you have with your health care provider. Document Released: 04/24/2009 Document Revised: 12/04/2015 Document Reviewed: 08/02/2014 Elsevier Interactive Patient Education  2017 [employer].

## 2019-06-13 NOTE — Progress Notes (Signed)
Subjective:   Yolanda Walsh is a 68 y.o. female who presents for Medicare Annual (Subsequent) preventive examination.  Review of Systems: N/A   This visit is being conducted through telemedicine via telephone at the nurse health advisor's home address due to the COVID-19 pandemic. This patient has given me verbal consent via doximity to conduct this visit, patient states they are participating from their home address. Patient and myself are on the telephone call. There is no referral for this visit. Some vital signs may be absent or patient reported.    Patient identification: identified by name, DOB, and current address   Cardiac Risk Factors include: advanced age (>12men, >34 women);dyslipidemia     Objective:     Vitals: There were no vitals taken for this visit.  There is no height or weight on file to calculate BMI.  Advanced Directives 06/13/2019 06/02/2018 06/01/2017 03/15/2014  Does Patient Have a Medical Advance Directive? Yes Yes Yes No  Type of Paramedic of [place];Living will [place];Living will [place];Living will -  Copy of [place] in Chart? No - copy requested No - copy requested No - copy requested -  Would patient like information on creating a medical advance directive? - - - No - patient declined information    Tobacco Social History   Tobacco Use  Smoking Status Never Smoker  Smokeless Tobacco Never Used     Counseling given: Not Answered   Clinical Intake:  Pre-visit preparation completed: Yes  Pain : No/denies pain     Nutritional Risks: None Diabetes: No  How often do you need to have someone help you when you read instructions, pamphlets, or other written materials from your doctor or pharmacy?: 1 - Never What is the last grade level you completed in school?: 12th  Interpreter Needed?: No  Information entered by :: [employer], [employer]  Past Medical History:   Diagnosis Date  . Ankle fracture    right  . Depression   . History of anal fissures 2013   worsened by constipation   . History of hemorrhoids    w/o complications  . History of rectal fissure   . Hyperlipidemia 04/24/2012  . Lichen sclerosus    temovate cream  . Medial meniscus tear   . Osteopenia    dexa 2013, 2016  . Trigger finger    Past Surgical History:  Procedure Laterality Date  . COLONOSCOPY  09/2003  . COLONOSCOPY  07/2016   TA, diverticulosis, rpt 5 yrs Henrene Pastor)  . dexa  04/2012   T -2.1 hip, -1.2 spine  . dexa  03/2015   T -2.4 hip, -1.7 spine  . I&D EXTREMITY Right 03/16/2014   cat bite hand s/p I&D in OR ([name])  . left hand surgery  1962   tendon laceration  . PARTIAL HYSTERECTOMY  1980   for prolapse, ovaries remain   Family History  Problem Relation Age of Onset  . Cancer Mother        leukemia  . Coronary artery disease Mother 39  . Coronary artery disease Father 59       MI  . Parkinsonism Brother 76       deceased  . Stroke Brother   . CAD Brother 37       7 stents  . Hypertension Brother   . Hypertension Sister   . Crohn's disease Sister   . Diabetes Maternal Grandfather   . CAD  Other 56       stent   Social History   Socioeconomic History  . Marital status: Divorced    Spouse name: Not on file  . Number of children: Not on file  . Years of education: Not on file  . Highest education level: Not on file  Occupational History  . Not on file  Social Needs  . Financial resource strain: Not hard at all  . Food insecurity    Worry: Never true    Inability: Never true  . Transportation needs    Medical: No    Non-medical: No  Tobacco Use  . Smoking status: Never Smoker  . Smokeless tobacco: Never Used  Substance and Sexual Activity  . Alcohol use: Yes    Comment: rare  . Drug use: No  . Sexual activity: Not on file  Lifestyle  . Physical activity    Days per week: 0 days    Minutes per session: 0 min  . Stress: Not at all   Relationships  . Social Herbalist on phone: Not on file    Gets together: Not on file    Attends religious service: Not on file    Active member of club or organization: Not on file    Attends meetings of clubs or organizations: Not on file    Relationship status: Not on file  Other Topics Concern  . Not on file  Social History Narrative   Lives alone with cat and dog.  Widow, husband deceased from [place].   Occupation: retired, prior worked in Psych[employer], educational   Activity: no regular exercise, has eliptical   Diet: good water, fruits/vegetables daily    Outpatient Encounter Medications as of 06/13/2019  Medication Sig  . AMBULATORY NON FORMULARY MEDICATION Medication Name: nitroglycerin 0.125% gel to [facility]. apply a pea size amount to your rectum three times daily x 6-8 weeks.  . cetirizine (ZYRTEC) 10 MG tablet Take 10 mg by mouth daily.  . Cholecalciferol (VITAMIN D3) 1000 UNITS CAPS Take 1 capsule (1,000 Units total) by mouth daily.  Marland Kitchen ibuprofen (ADVIL,MOTRIN) 200 MG tablet Take 200 mg by mouth every 6 (six) hours as needed.  . Multiple Vitamins-Minerals (PRESERVISION AREDS 2+MULTI VIT PO) Take 2 capsules by mouth daily.  . rosuvastatin (CRESTOR) 5 MG tablet Take 1 tablet (5 mg total) by mouth every Monday, Wednesday, and Friday.  . sertraline (ZOLOFT) 50 MG tablet TAKE 1 TABLET BY MOUTH ONCE A DAY   No facility-administered encounter medications on file as of 06/13/2019.     Activities of Daily Living In your present state of health, do you have any difficulty performing the following activities: 06/13/2019  Hearing? Y  Comment ringing in ears  Vision? N  Difficulty concentrating or making decisions? N  Walking or climbing stairs? N  Dressing or bathing? N  Doing errands, shopping? N  Preparing Food and eating ? N  Using the Toilet? N  In the past six months, have you accidently leaked urine? N  Do you have problems with loss of bowel control? N   Managing your Medications? N  Managing your Finances? N  Housekeeping or managing your Housekeeping? N  Some recent data might be hidden    Patient Care Team: Ria Bush, MD as PCP - General (Family Medicine) Eulogio Bear, MD as Consulting Physician (Ophthalmology)    Assessment:   This is a routine wellness examination for Yolanda Walsh.  Exercise Activities and Dietary recommendations  Current Exercise Habits: Home exercise routine, Type of exercise: walking, Time (Minutes): 30, Frequency (Times/Week): 7, Weekly Exercise (Minutes/Week): 210, Intensity: Mild, Exercise limited by: None identified  Goals    . Increase physical activity     Starting 06/02/2018 and weather permitting, I will continue to walk 1-2 miles daily.      . Patient Stated     06/13/2019, I will try to work on losing some weight.       Fall Risk Fall Risk  06/13/2019 06/02/2018 06/01/2017 05/27/2016  Falls in the past year? 0 0 No Yes  Number falls in past yr: 0 - - 1  Injury with Fall? 0 - - Yes  Comment - - - Broken ankle  Follow up Falls evaluation completed;Falls prevention discussed - - -   Is the patient's home free of loose throw rugs in walkways, pet beds, electrical cords, etc?   yes      Grab bars in the bathroom? no      Handrails on the stairs?   yes      Adequate lighting?   yes  Timed Get Up and Go performed: N/A  Depression Screen PHQ 2/9 Scores 06/13/2019 06/02/2018 06/01/2017 05/27/2016  PHQ - 2 Score 0 0 1 0  PHQ- 9 Score 0 0 1 -     Cognitive Function MMSE - Mini Mental State Exam 06/13/2019 06/02/2018 06/01/2017  Orientation to time 5 5 5   Orientation to Place 5 5 5   Registration 3 3 3   Attention/ Calculation 5 0 0  Recall 3 3 3   Language- name 2 objects - 0 0  Language- repeat 1 1 1   Language- follow 3 step command - 3 3  Language- read & follow direction - 0 0  Write a sentence - 0 0  Copy design - 0 0  Total score - 20 20  Mini Cog  Mini-Cog screen was  completed. Maximum score is 22. A value of 0 denotes this part of the MMSE was not completed or the patient failed this part of the Mini-Cog screening.       Immunization History  Administered Date(s) Administered  . Influenza Split 04/18/2012  . Influenza Whole 04/13/2008  . Influenza,inj,Quad PF,6+ Mos 04/19/2013, 04/01/2014, 03/28/2015, 03/31/2016, 04/08/2017, 04/06/2018, 03/01/2019  . Pneumococcal Conjugate-13 05/27/2016  . Pneumococcal Polysaccharide-23 06/01/2017  . Td 03/13/1999  . Tdap 02/04/2011  . Zoster 05/23/2012  . Zoster Recombinat (Shingrix) 09/12/2017, 11/14/2017    Qualifies for Shingles Vaccine? Completed series  Screening Tests Health Maintenance  Topic Date Due  . MAMMOGRAM  04/18/2020  . DTaP/Tdap/Td (2 - Td) 02/03/2021  . TETANUS/TDAP  02/03/2021  . COLONOSCOPY  07/26/2021  . INFLUENZA VACCINE  Completed  . DEXA SCAN  Completed  . Hepatitis C Screening  Completed  . PNA vac Low Risk Adult  Completed    Cancer Screenings: Lung: Low Dose CT Chest recommended if Age 48-80 years, 30 pack-year currently smoking OR have quit w/in 15years. Patient does not qualify. Breast:  Up to date on Mammogram? Yes, completed 04/19/2019  Up to date of Bone Density/Dexa? Yes, completed 05/14/2019 Colorectal: Completed 07/26/2016   Additional Screenings:  Hepatitis C Screening: 05/14/2015     Plan:   Patient will try to work on losing some weight.   I have personally reviewed and noted the following in the patient's chart:   . Medical and social history . Use of alcohol, tobacco or illicit drugs  . Current medications and supplements .  Functional ability and status . Nutritional status . Physical activity . Advanced directives . List of other physicians . Hospitalizations, surgeries, and ER visits in previous 12 months . Vitals . Screenings to include cognitive, depression, and falls . Referrals and appointments  In addition, I have reviewed and discussed  with patient certain preventive protocols, quality metrics, and best practice recommendations. A written personalized care plan for preventive services as well as general preventive health recommendations were provided to patient.     Andrez Grime, LPN  [id number]

## 2019-06-13 NOTE — Progress Notes (Signed)
PCP notes:  Health Maintenance: none   Abnormal Screenings: none   Patient concerns: none   Nurse concerns: none   Next PCP appt.: 06/18/2019 @ 10:30 am

## 2019-06-15 DIAGNOSIS — H353132 Nonexudative age-related macular degeneration, bilateral, intermediate dry stage: Secondary | ICD-10-CM | POA: Diagnosis not present

## 2019-06-18 ENCOUNTER — Ambulatory Visit (INDEPENDENT_AMBULATORY_CARE_PROVIDER_SITE_OTHER): Payer: PPO | Admitting: Family Medicine

## 2019-06-18 ENCOUNTER — Other Ambulatory Visit: Payer: Self-pay

## 2019-06-18 ENCOUNTER — Encounter: Payer: Self-pay | Admitting: Family Medicine

## 2019-06-18 VITALS — BP 122/70 | HR 69 | Temp 98.0°F | Ht 64.0 in | Wt 185.4 lb

## 2019-06-18 DIAGNOSIS — H259 Unspecified age-related cataract: Secondary | ICD-10-CM | POA: Diagnosis not present

## 2019-06-18 DIAGNOSIS — E785 Hyperlipidemia, unspecified: Secondary | ICD-10-CM | POA: Diagnosis not present

## 2019-06-18 DIAGNOSIS — E669 Obesity, unspecified: Secondary | ICD-10-CM

## 2019-06-18 DIAGNOSIS — F419 Anxiety disorder, unspecified: Secondary | ICD-10-CM

## 2019-06-18 DIAGNOSIS — Z0000 Encounter for general adult medical examination without abnormal findings: Secondary | ICD-10-CM | POA: Diagnosis not present

## 2019-06-18 DIAGNOSIS — E559 Vitamin D deficiency, unspecified: Secondary | ICD-10-CM | POA: Diagnosis not present

## 2019-06-18 DIAGNOSIS — Z7189 Other specified counseling: Secondary | ICD-10-CM | POA: Diagnosis not present

## 2019-06-18 DIAGNOSIS — H3530 Unspecified macular degeneration: Secondary | ICD-10-CM

## 2019-06-18 DIAGNOSIS — M8580 Other specified disorders of bone density and structure, unspecified site: Secondary | ICD-10-CM

## 2019-06-18 DIAGNOSIS — H269 Unspecified cataract: Secondary | ICD-10-CM | POA: Insufficient documentation

## 2019-06-18 MED ORDER — SERTRALINE HCL 50 MG PO TABS: 50.0000 mg | ORAL_TABLET | Freq: Every day | ORAL | 3 refills | Status: DC

## 2019-06-18 MED ORDER — ROSUVASTATIN CALCIUM 5 MG PO TABS: 5.0000 mg | ORAL_TABLET | ORAL | 3 refills | Status: DC

## 2019-06-18 NOTE — Assessment & Plan Note (Signed)
Stable period on sertraline - desires to continue.

## 2019-06-18 NOTE — Assessment & Plan Note (Signed)
Chronic, stable period. Followed by OBGYN. Continue cal/vit D and weight bearing exercise.

## 2019-06-18 NOTE — Assessment & Plan Note (Signed)
Preventative protocols reviewed and updated unless pt declined. Discussed healthy diet and lifestyle.  

## 2019-06-18 NOTE — Assessment & Plan Note (Signed)
Advanced directive - scanned in chart 06/2019. Daughter and son are HCPOA. Does not want prolonged life support. Ok with temporary measures.

## 2019-06-18 NOTE — Assessment & Plan Note (Signed)
Congratulated on weight loss noted. Encouraged healthy diet and lifestyle changes to affect sustainable weight loss. Continue regular walking routine.

## 2019-06-18 NOTE — Patient Instructions (Signed)
You are doing well today!  Continue regular walking routine. Cholesterol levels are much better! Return as needed or in 1 year for next physical.  Health Maintenance After Age 68 After age 21, you are at a higher risk for certain long-term diseases and infections as well as injuries from falls. Falls are a major cause of broken bones and head injuries in people who are older than age 70. Getting regular preventive care can help to keep you healthy and well. Preventive care includes getting regular testing and making lifestyle changes as recommended by your health care provider. Talk with your health care provider about:  Which screenings and tests you should have. A screening is a test that checks for a disease when you have no symptoms.  A diet and exercise plan that is right for you. What should I know about screenings and tests to prevent falls? Screening and testing are the best ways to find a health problem early. Early diagnosis and treatment give you the best chance of managing medical conditions that are common after age 77. Certain conditions and lifestyle choices may make you more likely to have a fall. Your health care provider may recommend:  Regular vision checks. Poor vision and conditions such as cataracts can make you more likely to have a fall. If you wear glasses, make sure to get your prescription updated if your vision changes.  Medicine review. Work with your health care provider to regularly review all of the medicines you are taking, including over-the-counter medicines. Ask your health care provider about any side effects that may make you more likely to have a fall. Tell your health care provider if any medicines that you take make you feel dizzy or sleepy.  Osteoporosis screening. Osteoporosis is a condition that causes the bones to get weaker. This can make the bones weak and cause them to break more easily.  Blood pressure screening. Blood pressure changes and  medicines to control blood pressure can make you feel dizzy.  Strength and balance checks. Your health care provider may recommend certain tests to check your strength and balance while standing, walking, or changing positions.  Foot health exam. Foot pain and numbness, as well as not wearing proper footwear, can make you more likely to have a fall.  Depression screening. You may be more likely to have a fall if you have a fear of falling, feel emotionally low, or feel unable to do activities that you used to do.  Alcohol use screening. Using too much alcohol can affect your balance and may make you more likely to have a fall. What actions can I take to lower my risk of falls? General instructions  Talk with your health care provider about your risks for falling. Tell your health care provider if: ? You fall. Be sure to tell your health care provider about all falls, even ones that seem minor. ? You feel dizzy, sleepy, or off-balance.  Take over-the-counter and prescription medicines only as told by your health care provider. These include any supplements.  Eat a healthy diet and maintain a healthy weight. A healthy diet includes low-fat dairy products, low-fat (lean) meats, and fiber from whole grains, beans, and lots of fruits and vegetables. Home safety  Remove any tripping hazards, such as rugs, cords, and clutter.  Install safety equipment such as grab bars in bathrooms and safety rails on stairs.  Keep rooms and walkways well-lit. Activity   Follow a regular exercise program to stay fit. This  will help you maintain your balance. Ask your health care provider what types of exercise are appropriate for you.  If you need a cane or walker, use it as recommended by your health care provider.  Wear supportive shoes that have nonskid soles. Lifestyle  Do not drink alcohol if your health care provider tells you not to drink.  If you drink alcohol, limit how much you have: ? 0-1  drink a day for women. ? 0-2 drinks a day for men.  Be aware of how much alcohol is in your drink. In the [place] one drink equals one typical bottle of beer (12 oz), one-half glass of wine (5 oz), or one shot of hard liquor (1 oz).  Do not use any products that contain nicotine or tobacco, such as cigarettes and e-cigarettes. If you need help quitting, ask your health care provider. Summary  Having a healthy lifestyle and getting preventive care can help to protect your health and wellness after age 27.  Screening and testing are the best way to find a health problem early and help you avoid having a fall. Early diagnosis and treatment give you the best chance for managing medical conditions that are more common for people who are older than age 69.  Falls are a major cause of broken bones and head injuries in people who are older than age 9. Take precautions to prevent a fall at home.  Work with your health care provider to learn what changes you can make to improve your health and wellness and to prevent falls. This information is not intended to replace advice given to you by your health care provider. Make sure you discuss any questions you have with your health care provider. Document Released: 05/11/2017 Document Revised: 10/19/2018 Document Reviewed: 05/11/2017 Elsevier Patient Education  2020 [employer].

## 2019-06-18 NOTE — Assessment & Plan Note (Addendum)
Chronic, tolerating crestor well. Continue current regimen.  The 10-year ASCVD risk score Mikey Bussing DC Brooke Bonito., et al., 2013) is: 6.9%   Values used to calculate the score:     Age: 68 years     Sex: Female     Is Non-Hispanic African American: No     Diabetic: No     Tobacco smoker: No     Systolic Blood Pressure: 123XX123 mmHg     Is BP treated: No     HDL Cholesterol: 51.2 mg/dL     Total Cholesterol: 181 mg/dL

## 2019-06-18 NOTE — Assessment & Plan Note (Signed)
Will need this checked next year.

## 2019-06-18 NOTE — Assessment & Plan Note (Signed)
Planning to have cataract surgery L>R ([place])

## 2019-06-18 NOTE — Progress Notes (Signed)
This visit was conducted in person.  BP 122/70 (BP Location: Left Arm, Patient Position: Sitting, Cuff Size: Normal)   Pulse 69   Temp 98 F (36.7 C) (Temporal)   Ht 5\' 4"  (1.626 m)   Wt 185 lb 7 oz (84.1 kg)   SpO2 98%   BMI 31.83 kg/m    CC: CPE Subjective:    Patient ID: Yolanda Walsh, female    DOB: 06/08/1951, 68 y.o.   MRN: [id number]  HPI: Yolanda Walsh is a 68 y.o. female presenting on 06/18/2019 for Annual Exam (Prt 2. )   Saw health advisor last week for medicare wellness visit. Note reviewed.    No exam data present    Clinical Support from 06/13/2019 in [place] [facility] Total Score  0      Fall Risk  06/13/2019 06/02/2018 06/01/2017 05/27/2016  Falls in the past year? 0 0 No Yes  Number falls in past yr: 0 - - 1  Injury with Fall? 0 - - Yes  Comment - - - Broken ankle  Follow up Falls evaluation completed;Falls prevention discussed - - -    Traveled to [facility] for [place] last year.  She walked 5k over Thanksgiving.!   HLD - we restarted crestor over summer after some dizzy episodes she was having which was attributed to presyncope from heat exposure and dehydration. No further episodes. She has tolerated crestor MWF well.   Preventative: Colonoscopy1/2018 - TA, diverticulosis, rpt 5 yrs Henrene Pastor).  Well woman with Dr. Marvel Plan h/o partal hysterectomy.sees yearly. Ovaries remain.  Mammogram - WNL10/2019 DEXA10/2013,03/2015, 04/2017, 05/2019 T -2.4 hip, -1.8 spine -reviewed calicum and vit D intake, rec weight bearing exercise. Flu - yearly Prevnar2017, pneumovax 2018 - localized reaction to this. Tdap 2012. zostavax- 2013 shingrix - completed 2 series 2019  Advanced directive - scanned in chart 06/2019. Daughter and son are HCPOA. Does not want prolonged life support. Ok with temporary measures.  Seat belt use discussed.  Sunscreen use discussed. No changing moles on skin.  Non smoker  Alcohol - none  Dentist -  yearly Eye exam yearly on preservision - planning to have cataracts done L>R ([place])   Lives alone with cat and dog. Widow, husband deceased from [place]. Brother died from [place].  Daughter lives in [place]. Mother of Antony Contras.  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 Interim medical history since our last visit reviewed. Allergies and medications reviewed and updated. Outpatient Medications Prior to Visit  Medication Sig Dispense Refill  . cetirizine (ZYRTEC) 10 MG tablet Take 10 mg by mouth daily.    . Cholecalciferol (VITAMIN D3) 1000 UNITS CAPS Take 1 capsule (1,000 Units total) by mouth daily. 30 capsule   . ibuprofen (ADVIL,MOTRIN) 200 MG tablet Take 200 mg by mouth every 6 (six) hours as needed.    . Multiple Vitamins-Minerals (PRESERVISION AREDS 2+MULTI VIT PO) Take 2 capsules by mouth daily.    . rosuvastatin (CRESTOR) 5 MG tablet Take 1 tablet (5 mg total) by mouth every Monday, Wednesday, and Friday. 40 tablet 3  . sertraline (ZOLOFT) 50 MG tablet TAKE 1 TABLET BY MOUTH ONCE A DAY 90 tablet 1  . AMBULATORY NON FORMULARY MEDICATION Medication Name: nitroglycerin 0.125% gel  to [facility]. apply a pea size amount to your rectum three times daily x 6-8 weeks. 30 g 0   No facility-administered medications prior to visit.      Per HPI unless specifically indicated in ROS section below Review of Systems  Constitutional: Negative for activity change, appetite change, chills, fatigue, fever and unexpected weight change.  HENT: Negative for hearing loss.   Eyes: Positive for visual disturbance.  Respiratory: Negative for cough, chest tightness, shortness of breath and wheezing.   Cardiovascular: Negative for chest  pain, palpitations and leg swelling.  Gastrointestinal: Negative for abdominal distention, abdominal pain, blood in stool, constipation, diarrhea, nausea and vomiting.  Genitourinary: Negative for difficulty urinating and hematuria.  Musculoskeletal: Negative for arthralgias, myalgias and neck pain.  Skin: Negative for rash.  Neurological: Negative for dizziness, seizures, syncope and headaches.  Hematological: Negative for adenopathy. Does not bruise/bleed easily.  Psychiatric/Behavioral: Negative for dysphoric mood. The patient is not nervous/anxious.    Objective:    BP 122/70 (BP Location: Left Arm, Patient Position: Sitting, Cuff Size: Normal)   Pulse 69   Temp 98 F (36.7 C) (Temporal)   Ht 5\' 4"  (1.626 m)   Wt 185 lb 7 oz (84.1 kg)   SpO2 98%   BMI 31.83 kg/m   Wt Readings from Last 3 Encounters:  06/18/19 185 lb 7 oz (84.1 kg)  03/01/19 186 lb 9 oz (84.6 kg)  02/27/19 187 lb (84.8 kg)    Physical Exam Vitals signs and nursing note reviewed.  Constitutional:      General: She is not in acute distress.    Appearance: Normal appearance. She is well-developed. She is obese. She is not ill-appearing.  HENT:     Head: Normocephalic and atraumatic.     Right Ear: Hearing, tympanic membrane, ear canal and external ear normal.     Left Ear: Hearing, tympanic membrane, ear canal and external ear normal.     Nose: Nose normal.     Mouth/Throat:     Pharynx: Uvula midline.  Eyes:     General: No scleral icterus.    Extraocular Movements: Extraocular movements intact.     Conjunctiva/sclera: Conjunctivae normal.     Pupils: Pupils are equal, round, and reactive to light.  Neck:     Musculoskeletal: Normal range of motion and neck supple.     Vascular: No carotid bruit.  Cardiovascular:     Rate and Rhythm: Normal rate and regular rhythm.     Pulses: Normal pulses.          Radial pulses are 2+ on the right side and 2+ on the left side.     Heart sounds: Normal heart  sounds. No murmur.  Pulmonary:     Effort: Pulmonary effort is normal. No respiratory distress.     Breath sounds: Normal breath sounds. No wheezing, rhonchi or rales.  Abdominal:     General: Abdomen is flat. Bowel sounds are normal. There is no distension.     Palpations: Abdomen is soft. There is no mass.     Tenderness: There is no abdominal tenderness. There is no guarding or rebound.     Hernia: No hernia is present.  Musculoskeletal: Normal range of motion.     Right lower leg: No edema.     Left lower leg: No edema.  Lymphadenopathy:     Cervical: No cervical adenopathy.  Skin:    General: Skin is warm and dry.     Findings:  No rash.  Neurological:     General: No focal deficit present.     Mental Status: She is alert and oriented to person, place, and time.     Comments: CN grossly intact, station and gait intact  Psychiatric:        Mood and Affect: Mood normal.        Behavior: Behavior normal.        Thought Content: Thought content normal.        Judgment: Judgment normal.       Results for orders placed or performed in visit on 06/13/19  Lipid panel  Result Value Ref Range   Cholesterol 181 0 - 200 mg/dL   Triglycerides 97.0 0.0 - 149.0 mg/dL   HDL 51.20 >39.00 mg/dL   VLDL 19.4 0.0 - 40.0 mg/dL   LDL Cholesterol 111 (H) 0 - 99 mg/dL   Total CHOL/HDL Ratio 4    NonHDL 129.93   Hepatic function panel  Result Value Ref Range   Total Bilirubin 0.4 0.2 - 1.2 mg/dL   Bilirubin, Direct 0.1 0.0 - 0.3 mg/dL   Alkaline Phosphatase 64 39 - 117 U/L   AST 17 0 - 37 U/L   ALT 12 0 - 35 U/L   Total Protein 6.8 6.0 - 8.3 g/dL   Albumin 4.0 3.5 - 5.2 g/dL   Assessment & Plan:  This visit occurred during the SARS-CoV-2 public health emergency.  Safety protocols were in place, including screening questions prior to the visit, additional usage of staff PPE, and extensive cleaning of exam room while observing appropriate contact time as indicated for disinfecting  solutions.   Problem List Items Addressed This Visit    Vitamin D deficiency    Will need this checked next year.       Osteopenia    Chronic, stable period. Followed by OBGYN. Continue cal/vit D and weight bearing exercise.      Obesity, Class I, BMI 30-34.9    Congratulated on weight loss noted. Encouraged healthy diet and lifestyle changes to affect sustainable weight loss. Continue regular walking routine.       Hyperlipidemia    Chronic, tolerating crestor well. Continue current regimen.  The 10-year ASCVD risk score Mikey Bussing DC Brooke Bonito., et al., 2013) is: 6.9%   Values used to calculate the score:     Age: 89 years     Sex: Female     Is Non-Hispanic African American: No     Diabetic: No     Tobacco smoker: No     Systolic Blood Pressure: 123XX123 mmHg     Is BP treated: No     HDL Cholesterol: 51.2 mg/dL     Total Cholesterol: 181 mg/dL       Relevant Medications   rosuvastatin (CRESTOR) 5 MG tablet   Healthcare maintenance - Primary    Preventative protocols reviewed and updated unless pt declined. Discussed healthy diet and lifestyle.       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      Relevant Medications   sertraline (ZOLOFT) 50 MG tablet   Advanced care planning/counseling discussion    Advanced directive - scanned in chart 06/2019. Daughter and son are HCPOA. Does not want prolonged life support. Ok with temporary measures.  Meds ordered this encounter  Medications  . rosuvastatin (CRESTOR) 5 MG tablet    Sig: Take 1 tablet (5 mg total) by mouth every Monday, Wednesday, and Friday.    Dispense:  40 tablet    Refill:  3  . sertraline (ZOLOFT) 50 MG tablet    Sig: Take 1 tablet (50 mg total) by mouth daily.    Dispense:  90 tablet    Refill:  3   No orders of the defined types were placed in  this encounter.   Patient instructions: You are doing well today!  Continue regular walking routine. Cholesterol levels are much better! Return as needed or in 1 year for next physical.  Follow up plan: Return in about 1 year (around 06/17/2020) for annual exam, prior fasting for blood work, medicare wellness visit.  Ria Bush, MD

## 2019-06-22 DIAGNOSIS — E7800 Pure hypercholesterolemia, unspecified: Secondary | ICD-10-CM | POA: Diagnosis not present

## 2019-06-22 DIAGNOSIS — H2512 Age-related nuclear cataract, left eye: Secondary | ICD-10-CM | POA: Diagnosis not present

## 2019-06-26 ENCOUNTER — Other Ambulatory Visit: Payer: Self-pay

## 2019-06-26 ENCOUNTER — Encounter: Payer: Self-pay | Admitting: Ophthalmology

## 2019-06-29 ENCOUNTER — Other Ambulatory Visit
Admission: RE | Admit: 2019-06-29 | Discharge: 2019-06-29 | Disposition: A | Discharge status: Home / Self Care | Payer: PPO | Source: Ambulatory Visit | Attending: Ophthalmology | Admitting: Ophthalmology

## 2019-06-29 DIAGNOSIS — Z01812 Encounter for preprocedural laboratory examination: Secondary | ICD-10-CM | POA: Diagnosis not present

## 2019-06-29 DIAGNOSIS — Z20828 Contact with and (suspected) exposure to other viral communicable diseases: Secondary | ICD-10-CM | POA: Insufficient documentation

## 2019-06-29 LAB — SARS CORONAVIRUS 2 (TAT 6-24 HRS): SARS Coronavirus 2: NEGATIVE

## 2019-07-02 NOTE — Discharge Instructions (Signed)
General Anesthesia, Adult, Care After °This sheet gives you information about how to care for yourself after your procedure. Your health care provider may also give you more specific instructions. If you have problems or questions, contact your health care provider. °What can I expect after the procedure? °After the procedure, the following side effects are common: °· Pain or discomfort at the IV site. °· Nausea. °· Vomiting. °· Sore throat. °· Trouble concentrating. °· Feeling cold or chills. °· Weak or tired. °· Sleepiness and fatigue. °· Soreness and body aches. These side effects can affect parts of the body that were not involved in surgery. °Follow these instructions at home: ° °For at least 24 hours after the procedure: °· Have a responsible adult stay with you. It is important to have someone help care for you until you are awake and alert. °· Rest as needed. °· Do not: °? Participate in activities in which you could fall or become injured. °? Drive. °? Use heavy machinery. °? Drink alcohol. °? Take sleeping pills or medicines that cause drowsiness. °? Make important decisions or sign legal documents. °? Take care of children on your own. °Eating and drinking °· Follow any instructions from your health care provider about eating or drinking restrictions. °· When you feel hungry, start by eating small amounts of foods that are soft and easy to digest (bland), such as toast. Gradually return to your regular diet. °· Drink enough fluid to keep your urine pale yellow. °· If you vomit, rehydrate by drinking water, juice, or clear broth. °General instructions °· If you have sleep apnea, surgery and certain medicines can increase your risk for breathing problems. Follow instructions from your health care provider about wearing your sleep device: °? Anytime you are sleeping, including during daytime naps. °? While taking prescription pain medicines, sleeping medicines, or medicines that make you drowsy. °· Return to  your normal activities as told by your health care provider. Ask your health care provider what activities are safe for you. °· Take over-the-counter and prescription medicines only as told by your health care provider. °· If you smoke, do not smoke without supervision. °· Keep all follow-up visits as told by your health care provider. This is important. °Contact a health care provider if: °· You have nausea or vomiting that does not get better with medicine. °· You cannot eat or drink without vomiting. °· You have pain that does not get better with medicine. °· You are unable to pass urine. °· You develop a skin rash. °· You have a fever. °· You have redness around your IV site that gets worse. °Get help right away if: °· You have difficulty breathing. °· You have chest pain. °· You have blood in your urine or stool, or you vomit blood. °Summary °· After the procedure, it is common to have a sore throat or nausea. It is also common to feel tired. °· Have a responsible adult stay with you for the first 24 hours after general anesthesia. It is important to have someone help care for you until you are awake and alert. °· When you feel hungry, start by eating small amounts of foods that are soft and easy to digest (bland), such as toast. Gradually return to your regular diet. °· Drink enough fluid to keep your urine pale yellow. °· Return to your normal activities as told by your health care provider. Ask your health care provider what activities are safe for you. °This information is not   intended to replace advice given to you by your health care provider. Make sure you discuss any questions you have with your health care provider. °Document Released: 10/04/2000 Document Revised: 07/01/2017 Document Reviewed: 02/11/2017 °Elsevier Patient Education © 2020 [employer] Inc[employer] °Cataract Surgery, Care After °This sheet gives you information about how to care for yourself after your procedure. Your health care provider may also  give you more specific instructions. If you have problems or questions, contact your health care provider. °What can I expect after the procedure? °After the procedure, it is common to have: °· Itching. °· Discomfort. °· Fluid discharge. °· Sensitivity to light and to touch. °· Bruising in or around the eye. °· Mild blurred vision. °Follow these instructions at home: °Eye care ° °· Do not touch or rub your eyes. °· Protect your eyes as told by your health care provider. You may be told to wear a protective eye shield or sunglasses. °· Do not put a contact lens into the affected eye or eyes until your health care provider approves. °· Keep the area around your eye clean and dry: °? Avoid swimming. °? Do not allow water to hit you directly in the face while showering. °? Keep soap and shampoo out of your eyes. °· Check your eye every day for signs of infection. Watch for: °? Redness, swelling, or pain. °? Fluid, blood, or pus. °? Warmth. °? A bad smell. °? Vision that is getting worse. °? Sensitivity that is getting worse. °Activity °· Do not drive for 24 hours if you were given a sedative during your procedure. °· Avoid strenuous activities, such as playing contact sports, for as long as told by your health care provider. °· Do not drive or use heavy machinery until your health care provider approves. °· Do not bend or lift heavy objects. Bending increases pressure in the eye. You can walk, climb stairs, and do light household chores. °· Ask your health care provider when you can return to work. If you work in a dusty environment, you may be advised to wear protective eyewear for a period of time. °General instructions °· Take or apply over-the-counter and prescription medicines only as told by your health care provider. This includes eye drops. °· Keep all follow-up visits as told by your health care provider. This is important. °Contact a health care provider if: °· You have increased bruising around your  eye. °· You have pain that is not helped with medicine. °· You have a fever. °· You have redness, swelling, or pain in your eye. °· You have fluid, blood, or pus coming from your incision. °· Your vision gets worse. °· Your sensitivity to light gets worse. °Get help right away if: °· You have sudden loss of vision. °· You see flashes of light or spots (floaters). °· You have severe eye pain. °· You develop nausea or vomiting. °Summary °· After your procedure, it is common to have itching, discomfort, bruising, fluid discharge, or sensitivity to light. °· Follow instructions from your health care provider about caring for your eye after the procedure. °· Do not rub your eye after the procedure. You may need to wear eye protection or sunglasses. Do not wear contact lenses. Keep the area around your eye clean and dry. °· Avoid activities that require a lot of effort. These include playing sports and lifting heavy objects. °· Contact a health care provider if you have increased bruising, pain that does not go away, or a fever. Get   help right away if you suddenly lose your vision, see flashes of light or spots, or have severe pain in the eye. °This information is not intended to replace advice given to you by your health care provider. Make sure you discuss any questions you have with your health care provider. °Document Released: 01/15/2005 Document Revised: 12/26/2017 Document Reviewed: 12/26/2017 °Elsevier Patient Education © 2020 Elsevier Inc. ° °

## 2019-07-04 ENCOUNTER — Encounter: Payer: Self-pay | Admitting: Ophthalmology

## 2019-07-04 ENCOUNTER — Ambulatory Visit: Payer: PPO | Admitting: Anesthesiology

## 2019-07-04 ENCOUNTER — Other Ambulatory Visit: Payer: Self-pay

## 2019-07-04 ENCOUNTER — Ambulatory Visit
Admission: RE | Admit: 2019-07-04 | Discharge: 2019-07-04 | Disposition: A | Discharge status: Home / Self Care | Payer: PPO | Attending: Ophthalmology | Admitting: Ophthalmology

## 2019-07-04 ENCOUNTER — Encounter
Admission: RE | Disposition: A | Discharge status: Home / Self Care | Payer: Self-pay | Source: Home / Self Care | Attending: Ophthalmology

## 2019-07-04 DIAGNOSIS — H2512 Age-related nuclear cataract, left eye: Secondary | ICD-10-CM | POA: Diagnosis not present

## 2019-07-04 DIAGNOSIS — H25812 Combined forms of age-related cataract, left eye: Secondary | ICD-10-CM | POA: Diagnosis not present

## 2019-07-04 HISTORY — PX: CATARACT EXTRACTION W/PHACO: SHX586

## 2019-07-04 SURGERY — PHACOEMULSIFICATION, CATARACT, WITH IOL INSERTION
Anesthesia: Monitor Anesthesia Care | Site: Eye | Laterality: Left

## 2019-07-04 MED ORDER — ARMC OPHTHALMIC DILATING DROPS
1.0000 "application " | OPHTHALMIC | Status: DC | PRN
  Administered 2019-07-04 (×3): 1 via OPHTHALMIC

## 2019-07-04 MED ORDER — ACETAMINOPHEN 160 MG/5ML PO SOLN: 325.0000 mg | Freq: Once | ORAL | Status: DC

## 2019-07-04 MED ORDER — MOXIFLOXACIN HCL 0.5 % OP SOLN
1.0000 [drp] | OPHTHALMIC | Status: DC | PRN
  Administered 2019-07-04 (×3): 1 [drp] via OPHTHALMIC

## 2019-07-04 MED ORDER — BRIMONIDINE TARTRATE-TIMOLOL 0.2-0.5 % OP SOLN
OPHTHALMIC | Status: DC | PRN
  Administered 2019-07-04: 1 [drp] via OPHTHALMIC

## 2019-07-04 MED ORDER — NA HYALUR & NA CHOND-NA HYALUR 0.4-0.35 ML IO KIT
PACK | INTRAOCULAR | Status: DC | PRN
  Administered 2019-07-04: 1 mL via INTRAOCULAR

## 2019-07-04 MED ORDER — CEFUROXIME OPHTHALMIC INJECTION 1 MG/0.1 ML
INJECTION | OPHTHALMIC | Status: DC | PRN
  Administered 2019-07-04: 0.1 mL via INTRACAMERAL

## 2019-07-04 MED ORDER — FENTANYL CITRATE (PF) 100 MCG/2ML IJ SOLN
INTRAMUSCULAR | Status: DC | PRN
  Administered 2019-07-04: 50 ug via INTRAVENOUS

## 2019-07-04 MED ORDER — ACETAMINOPHEN 325 MG PO TABS: 325.0000 mg | ORAL_TABLET | Freq: Once | ORAL | Status: DC

## 2019-07-04 MED ORDER — TETRACAINE HCL 0.5 % OP SOLN
1.0000 [drp] | OPHTHALMIC | Status: DC | PRN
  Administered 2019-07-04 (×3): 1 [drp] via OPHTHALMIC

## 2019-07-04 MED ORDER — EPINEPHRINE PF 1 MG/ML IJ SOLN
INTRAOCULAR | Status: DC | PRN
  Administered 2019-07-04: 44 mL via OPHTHALMIC

## 2019-07-04 MED ORDER — LIDOCAINE HCL (PF) 2 % IJ SOLN
INTRAOCULAR | Status: DC | PRN
  Administered 2019-07-04: 1 mL

## 2019-07-04 MED ORDER — MIDAZOLAM HCL 2 MG/2ML IJ SOLN
INTRAMUSCULAR | Status: DC | PRN
  Administered 2019-07-04: 2 mg via INTRAVENOUS

## 2019-07-04 MED ORDER — LACTATED RINGERS IV SOLN: 10.0000 mL/h | INTRAVENOUS | Status: DC

## 2019-07-04 SURGICAL SUPPLY — 16 items
CANNULA ANT/CHMB 27G (MISCELLANEOUS) ×1 IMPLANT
CANNULA ANT/CHMB 27GA (MISCELLANEOUS) ×3 IMPLANT
GLOVE SURG LX 7.5 STRW (GLOVE) ×2
GLOVE SURG LX STRL 7.5 STRW (GLOVE) ×1 IMPLANT
GLOVE SURG TRIUMPH 8.0 PF LTX (GLOVE) ×3 IMPLANT
GOWN STRL REUS W/ TWL LRG LVL3 (GOWN DISPOSABLE) ×2 IMPLANT
GOWN STRL REUS W/TWL LRG LVL3 (GOWN DISPOSABLE) ×6
LENS IOL TECNIS ITEC 18.0 (Intraocular Lens) ×2 IMPLANT
MARKER SKIN DUAL TIP RULER LAB (MISCELLANEOUS) ×3 IMPLANT
PACK CATARACT BRASINGTON (MISCELLANEOUS) ×3 IMPLANT
PACK EYE AFTER SURG (MISCELLANEOUS) ×3 IMPLANT
PACK OPTHALMIC (MISCELLANEOUS) ×3 IMPLANT
SYR 3ML LL SCALE MARK (SYRINGE) ×3 IMPLANT
SYR TB 1ML LUER SLIP (SYRINGE) ×3 IMPLANT
WATER STERILE IRR 500ML POUR (IV SOLUTION) ×3 IMPLANT
WIPE NON LINTING 3.25X3.25 (MISCELLANEOUS) ×3 IMPLANT

## 2019-07-04 NOTE — Transfer of Care (Signed)
Immediate Anesthesia Transfer of Care Note  Patient: Yolanda Walsh  Procedure(s) Performed: CATARACT EXTRACTION PHACO AND INTRAOCULAR LENS PLACEMENT (IOC) LEFT  6.67  00:44.0  15.2% (Left Eye)  Patient Location: PACU  Anesthesia Type: MAC  Level of Consciousness: awake, alert  and patient cooperative  Airway and Oxygen Therapy: Patient Spontanous Breathing and Patient connected to supplemental oxygen  Post-op Assessment: Post-op Vital signs reviewed, Patient's Cardiovascular Status Stable, Respiratory Function Stable, Patent Airway and No signs of Nausea or vomiting  Post-op Vital Signs: Reviewed and stable  Complications: No apparent anesthesia complications

## 2019-07-04 NOTE — Anesthesia Procedure Notes (Signed)
Procedure Name: MAC Date/Time: 07/04/2019 10:08 AM Performed by: Georga Bora, CRNA Pre-anesthesia Checklist: Patient identified, Suction available, Emergency Drugs available, Patient being monitored and Timeout performed Patient Re-evaluated:Patient Re-evaluated prior to induction Oxygen Delivery Method: Nasal cannula

## 2019-07-04 NOTE — Anesthesia Preprocedure Evaluation (Signed)
Anesthesia Evaluation  Patient identified by MRN, date of birth, ID band Patient awake    Reviewed: Allergy & Precautions, H&P , NPO status , Patient's Chart, lab work & pertinent test results  Airway Mallampati: II  TM Distance: >3 FB Neck ROM: full    Dental no notable dental hx.    Pulmonary    Pulmonary exam normal breath sounds clear to auscultation       Cardiovascular Normal cardiovascular exam Rhythm:regular Rate:Normal     Neuro/Psych PSYCHIATRIC DISORDERS    GI/Hepatic   Endo/Other    Renal/GU      Musculoskeletal   Abdominal   Peds  Hematology   Anesthesia Other Findings   Reproductive/Obstetrics                             Anesthesia Physical Anesthesia Plan  ASA: II  Anesthesia Plan: MAC   Post-op Pain Management:    Induction:   PONV Risk Score and Plan: 2 and TIVA, Midazolam and Treatment may vary due to age or medical condition  Airway Management Planned:   Additional Equipment:   Intra-op Plan:   Post-operative Plan:   Informed Consent: I have reviewed the patients History and Physical, chart, labs and discussed the procedure including the risks, benefits and alternatives for the proposed anesthesia with the patient or authorized representative who has indicated his/her understanding and acceptance.       Plan Discussed with: CRNA  Anesthesia Plan Comments:         Anesthesia Quick Evaluation

## 2019-07-04 NOTE — H&P (Signed)

## 2019-07-04 NOTE — Op Note (Signed)
OPERATIVE NOTE  Yolanda Walsh [id number] 07/04/2019   PREOPERATIVE DIAGNOSIS:  Nuclear sclerotic cataract left eye. H25.12   POSTOPERATIVE DIAGNOSIS:    Nuclear sclerotic cataract left eye.     PROCEDURE:  Phacoemusification with posterior chamber intraocular lens placement of the left eye  Ultrasound time: Procedure(s): CATARACT EXTRACTION PHACO AND INTRAOCULAR LENS PLACEMENT (IOC) LEFT  6.67  00:44.0  15.2% (Left)  LENS:   Implant Name Type Inv. Item Serial No. Manufacturer Lot No. LRB No. Used Action  LENS IOL DIOP 18.0 - [id number] Intraocular Lens LENS IOL DIOP 18.0 [id number]  Left 1 Implanted      SURGEON:  Wyonia Hough, MD   ANESTHESIA:  Topical with tetracaine drops and 2% Xylocaine jelly, augmented with 1% preservative-free intracameral lidocaine.    COMPLICATIONS:  None.   DESCRIPTION OF PROCEDURE:  The patient was identified in the holding room and transported to the operating room and placed in the supine position under the operating microscope.  The left eye was identified as the operative eye and it was prepped and draped in the usual sterile ophthalmic fashion.   A 1 millimeter clear-corneal paracentesis was made at the 1:30 position.  0.5 ml of preservative-free 1% lidocaine was injected into the anterior chamber.  The anterior chamber was filled with Viscoat viscoelastic.  A 2.4 millimeter keratome was used to make a near-clear corneal incision at the 10:30 position.  .  A curvilinear capsulorrhexis was made with a cystotome and capsulorrhexis forceps.  Balanced salt solution was used to hydrodissect and hydrodelineate the nucleus.   Phacoemulsification was then used in stop and chop fashion to remove the lens nucleus and epinucleus.  The remaining cortex was then removed using the irrigation and aspiration handpiece. Provisc was then placed into the capsular bag to distend it for lens placement.  A lens was then injected into the capsular bag.  The  remaining viscoelastic was aspirated.   Wounds were hydrated with balanced salt solution.  The anterior chamber was inflated to a physiologic pressure with balanced salt solution.  No wound leaks were noted. Cefuroxime 0.1 ml of a 10mg /ml solution was injected into the anterior chamber for a dose of 1 mg of intracameral antibiotic at the completion of the case.   Timolol and Brimonidine drops were applied to the eye.  The patient was taken to the recovery room in stable condition without complications of anesthesia or surgery.  [name] 07/04/2019, 10:25 AM

## 2019-07-04 NOTE — Anesthesia Postprocedure Evaluation (Signed)
Anesthesia Post Note  Patient: Yolanda Walsh  Procedure(s) Performed: CATARACT EXTRACTION PHACO AND INTRAOCULAR LENS PLACEMENT (IOC) LEFT  6.67  00:44.0  15.2% (Left Eye)     Patient location during evaluation: PACU Anesthesia Type: MAC Level of consciousness: awake and alert and oriented Pain management: satisfactory to patient Vital Signs Assessment: post-procedure vital signs reviewed and stable Respiratory status: spontaneous breathing, nonlabored ventilation and respiratory function stable Cardiovascular status: blood pressure returned to baseline and stable Postop Assessment: Adequate PO intake and No signs of nausea or vomiting Anesthetic complications: no    Raliegh Ip

## 2019-07-16 DIAGNOSIS — H2511 Age-related nuclear cataract, right eye: Secondary | ICD-10-CM | POA: Diagnosis not present

## 2019-07-24 ENCOUNTER — Encounter: Payer: Self-pay | Admitting: Ophthalmology

## 2019-07-24 ENCOUNTER — Other Ambulatory Visit: Payer: Self-pay

## 2019-07-30 ENCOUNTER — Other Ambulatory Visit
Admission: RE | Admit: 2019-07-30 | Discharge: 2019-07-30 | Disposition: A | Discharge status: Home / Self Care | Payer: PPO | Source: Ambulatory Visit | Attending: Ophthalmology | Admitting: Ophthalmology

## 2019-07-30 DIAGNOSIS — Z20822 Contact with and (suspected) exposure to covid-19: Secondary | ICD-10-CM | POA: Diagnosis not present

## 2019-07-30 DIAGNOSIS — Z01812 Encounter for preprocedural laboratory examination: Secondary | ICD-10-CM | POA: Diagnosis not present

## 2019-07-30 LAB — SARS CORONAVIRUS 2 (TAT 6-24 HRS): SARS Coronavirus 2: NEGATIVE

## 2019-07-31 NOTE — Discharge Instructions (Signed)

## 2019-08-01 ENCOUNTER — Ambulatory Visit
Admission: RE | Admit: 2019-08-01 | Discharge: 2019-08-01 | Disposition: A | Discharge status: Home / Self Care | Payer: PPO | Attending: Ophthalmology | Admitting: Ophthalmology

## 2019-08-01 ENCOUNTER — Other Ambulatory Visit: Payer: Self-pay

## 2019-08-01 ENCOUNTER — Encounter: Payer: Self-pay | Admitting: Ophthalmology

## 2019-08-01 ENCOUNTER — Ambulatory Visit: Payer: PPO | Admitting: Anesthesiology

## 2019-08-01 ENCOUNTER — Encounter
Admission: RE | Disposition: A | Discharge status: Home / Self Care | Payer: Self-pay | Source: Home / Self Care | Attending: Ophthalmology

## 2019-08-01 DIAGNOSIS — Z6831 Body mass index (BMI) 31.0-31.9, adult: Secondary | ICD-10-CM | POA: Insufficient documentation

## 2019-08-01 DIAGNOSIS — Z79899 Other long term (current) drug therapy: Secondary | ICD-10-CM | POA: Insufficient documentation

## 2019-08-01 DIAGNOSIS — H2511 Age-related nuclear cataract, right eye: Secondary | ICD-10-CM | POA: Diagnosis not present

## 2019-08-01 DIAGNOSIS — H25811 Combined forms of age-related cataract, right eye: Secondary | ICD-10-CM | POA: Diagnosis not present

## 2019-08-01 DIAGNOSIS — F419 Anxiety disorder, unspecified: Secondary | ICD-10-CM | POA: Insufficient documentation

## 2019-08-01 DIAGNOSIS — F329 Major depressive disorder, single episode, unspecified: Secondary | ICD-10-CM | POA: Diagnosis not present

## 2019-08-01 HISTORY — PX: CATARACT EXTRACTION W/PHACO: SHX586

## 2019-08-01 SURGERY — PHACOEMULSIFICATION, CATARACT, WITH IOL INSERTION
Anesthesia: Monitor Anesthesia Care | Site: Eye | Laterality: Right

## 2019-08-01 MED ORDER — ARMC OPHTHALMIC DILATING DROPS
1.0000 "application " | OPHTHALMIC | Status: DC | PRN
  Administered 2019-08-01 (×3): 1 via OPHTHALMIC

## 2019-08-01 MED ORDER — FENTANYL CITRATE (PF) 100 MCG/2ML IJ SOLN
INTRAMUSCULAR | Status: DC | PRN
  Administered 2019-08-01: 50 ug via INTRAVENOUS

## 2019-08-01 MED ORDER — NA HYALUR & NA CHOND-NA HYALUR 0.4-0.35 ML IO KIT
PACK | INTRAOCULAR | Status: DC | PRN
  Administered 2019-08-01: 1 mL via INTRAOCULAR

## 2019-08-01 MED ORDER — MIDAZOLAM HCL 2 MG/2ML IJ SOLN
INTRAMUSCULAR | Status: DC | PRN
  Administered 2019-08-01 (×2): 1 mg via INTRAVENOUS

## 2019-08-01 MED ORDER — LACTATED RINGERS IV SOLN: INTRAVENOUS | Status: DC

## 2019-08-01 MED ORDER — EPINEPHRINE PF 1 MG/ML IJ SOLN
INTRAOCULAR | Status: DC | PRN
  Administered 2019-08-01: 61 mL via OPHTHALMIC

## 2019-08-01 MED ORDER — LACTATED RINGERS IV SOLN: 100.0000 mL/h | INTRAVENOUS | Status: DC

## 2019-08-01 MED ORDER — TETRACAINE HCL 0.5 % OP SOLN
1.0000 [drp] | OPHTHALMIC | Status: DC | PRN
  Administered 2019-08-01 (×3): 1 [drp] via OPHTHALMIC

## 2019-08-01 MED ORDER — MOXIFLOXACIN HCL 0.5 % OP SOLN
1.0000 [drp] | OPHTHALMIC | Status: DC | PRN
  Administered 2019-08-01 (×3): 1 [drp] via OPHTHALMIC

## 2019-08-01 MED ORDER — LIDOCAINE HCL (PF) 2 % IJ SOLN
INTRAOCULAR | Status: DC | PRN
  Administered 2019-08-01: 1 mL

## 2019-08-01 MED ORDER — CEFUROXIME OPHTHALMIC INJECTION 1 MG/0.1 ML
INJECTION | OPHTHALMIC | Status: DC | PRN
  Administered 2019-08-01: 0.1 mL via INTRACAMERAL

## 2019-08-01 MED ORDER — BRIMONIDINE TARTRATE-TIMOLOL 0.2-0.5 % OP SOLN
OPHTHALMIC | Status: DC | PRN
  Administered 2019-08-01: 1 [drp] via OPHTHALMIC

## 2019-08-01 SURGICAL SUPPLY — 16 items
CANNULA ANT/CHMB 27G (MISCELLANEOUS) ×1 IMPLANT
CANNULA ANT/CHMB 27GA (MISCELLANEOUS) ×3 IMPLANT
GLOVE SURG LX 7.5 STRW (GLOVE) ×2
GLOVE SURG LX STRL 7.5 STRW (GLOVE) ×1 IMPLANT
GLOVE SURG TRIUMPH 8.0 PF LTX (GLOVE) ×3 IMPLANT
GOWN STRL REUS W/ TWL LRG LVL3 (GOWN DISPOSABLE) ×2 IMPLANT
GOWN STRL REUS W/TWL LRG LVL3 (GOWN DISPOSABLE) ×6
LENS IOL TECNIS ITEC 17.5 (Intraocular Lens) ×2 IMPLANT
MARKER SKIN DUAL TIP RULER LAB (MISCELLANEOUS) ×3 IMPLANT
PACK CATARACT BRASINGTON (MISCELLANEOUS) ×3 IMPLANT
PACK EYE AFTER SURG (MISCELLANEOUS) ×3 IMPLANT
PACK OPTHALMIC (MISCELLANEOUS) ×3 IMPLANT
SYR 3ML LL SCALE MARK (SYRINGE) ×3 IMPLANT
SYR TB 1ML LUER SLIP (SYRINGE) ×3 IMPLANT
WATER STERILE IRR 500ML POUR (IV SOLUTION) ×3 IMPLANT
WIPE NON LINTING 3.25X3.25 (MISCELLANEOUS) ×3 IMPLANT

## 2019-08-01 NOTE — Anesthesia Preprocedure Evaluation (Signed)
Anesthesia Evaluation  Patient identified by MRN, date of birth, ID band Patient awake    Reviewed: NPO status   History of Anesthesia Complications Negative for: history of anesthetic complications  Airway Mallampati: II  TM Distance: >3 FB Neck ROM: full    Dental no notable dental hx.    Pulmonary neg pulmonary ROS,    Pulmonary exam normal        Cardiovascular Exercise Tolerance: Good negative cardio ROS Normal cardiovascular exam     Neuro/Psych Anxiety Depression negative neurological ROS     GI/Hepatic negative GI ROS, Neg liver ROS,   Endo/Other  Morbid obesity (bmi 31)  Renal/GU negative Renal ROS  negative genitourinary   Musculoskeletal   Abdominal   Peds  Hematology negative hematology ROS (+)   Anesthesia Other Findings SARS Coronavirus  NEGATIVE      Reproductive/Obstetrics                             Anesthesia Physical Anesthesia Plan  ASA: II  Anesthesia Plan: MAC   Post-op Pain Management:    Induction:   PONV Risk Score and Plan: 2 and Midazolam and TIVA  Airway Management Planned:   Additional Equipment:   Intra-op Plan:   Post-operative Plan:   Informed Consent: I have reviewed the patients History and Physical, chart, labs and discussed the procedure including the risks, benefits and alternatives for the proposed anesthesia with the patient or authorized representative who has indicated his/her understanding and acceptance.       Plan Discussed with: CRNA  Anesthesia Plan Comments:         Anesthesia Quick Evaluation

## 2019-08-01 NOTE — Op Note (Signed)
LOCATION:  [place]   PREOPERATIVE DIAGNOSIS:    Nuclear sclerotic cataract right eye. H25.11   POSTOPERATIVE DIAGNOSIS:  Nuclear sclerotic cataract right eye.     PROCEDURE:  Phacoemusification with posterior chamber intraocular lens placement of the right eye   ULTRASOUND TIME: Procedure(s): CATARACT EXTRACTION PHACO AND INTRAOCULAR LENS PLACEMENT (IOC) RIGHT 5.33  00:45.4  11.8% (Right)  LENS:   Implant Name Type Inv. Item Serial No. Manufacturer Lot No. LRB No. Used Action  LENS IOL DIOP 17.5 - [id number] Intraocular Lens LENS IOL DIOP 17.5 [id number]  Right 1 Implanted         SURGEON:  Wyonia Hough, MD   ANESTHESIA:  Topical with tetracaine drops and 2% Xylocaine jelly, augmented with 1% preservative-free intracameral lidocaine.    COMPLICATIONS:  None.   DESCRIPTION OF PROCEDURE:  The patient was identified in the holding room and transported to the operating room and placed in the supine position under the operating microscope.  The right eye was identified as the operative eye and it was prepped and draped in the usual sterile ophthalmic fashion.   A 1 millimeter clear-corneal paracentesis was made at the 12:00 position.  0.5 ml of preservative-free 1% lidocaine was injected into the anterior chamber. The anterior chamber was filled with Viscoat viscoelastic.  A 2.4 millimeter keratome was used to make a near-clear corneal incision at the 9:00 position.  A curvilinear capsulorrhexis was made with a cystotome and capsulorrhexis forceps.  Balanced salt solution was used to hydrodissect and hydrodelineate the nucleus.   Phacoemulsification was then used in stop and chop fashion to remove the lens nucleus and epinucleus.  The remaining cortex was then removed using the irrigation and aspiration handpiece. Provisc was then placed into the capsular bag to distend it for lens placement.  A lens was then injected into the capsular bag.  The remaining  viscoelastic was aspirated.   Wounds were hydrated with balanced salt solution.  The anterior chamber was inflated to a physiologic pressure with balanced salt solution.  No wound leaks were noted. Cefuroxime 0.1 ml of a 10mg /ml solution was injected into the anterior chamber for a dose of 1 mg of intracameral antibiotic at the completion of the case.   Timolol and Brimonidine drops were applied to the eye.  The patient was taken to the recovery room in stable condition without complications of anesthesia or surgery.   Tedric Leeth 08/01/2019, 11:41 AM

## 2019-08-01 NOTE — Transfer of Care (Signed)
Immediate Anesthesia Transfer of Care Note  Patient: Yolanda Walsh  Procedure(s) Performed: CATARACT EXTRACTION PHACO AND INTRAOCULAR LENS PLACEMENT (IOC) RIGHT 5.33  00:45.4  11.8% (Right Eye)  Patient Location: PACU  Anesthesia Type: MAC  Level of Consciousness: awake, alert  and patient cooperative  Airway and Oxygen Therapy: Patient Spontanous Breathing and Patient connected to supplemental oxygen  Post-op Assessment: Post-op Vital signs reviewed, Patient's Cardiovascular Status Stable, Respiratory Function Stable, Patent Airway and No signs of Nausea or vomiting  Post-op Vital Signs: Reviewed and stable  Complications: No apparent anesthesia complications

## 2019-08-01 NOTE — Anesthesia Procedure Notes (Signed)
Procedure Name: MAC Date/Time: 08/01/2019 11:19 AM Performed by: Cameron Ali, CRNA Pre-anesthesia Checklist: Patient identified, Emergency Drugs available, Suction available, Timeout performed and Patient being monitored Patient Re-evaluated:Patient Re-evaluated prior to induction Oxygen Delivery Method: Nasal cannula Placement Confirmation: positive ETCO2

## 2019-08-01 NOTE — Anesthesia Postprocedure Evaluation (Signed)
Anesthesia Post Note  Patient: Yolanda Walsh  Procedure(s) Performed: CATARACT EXTRACTION PHACO AND INTRAOCULAR LENS PLACEMENT (IOC) RIGHT 5.33  00:45.4  11.8% (Right Eye)     Patient location during evaluation: PACU Anesthesia Type: MAC Level of consciousness: awake and alert Pain management: pain level controlled Vital Signs Assessment: post-procedure vital signs reviewed and stable Respiratory status: spontaneous breathing, nonlabored ventilation, respiratory function stable and patient connected to nasal cannula oxygen Cardiovascular status: stable and blood pressure returned to baseline Postop Assessment: no apparent nausea or vomiting Anesthetic complications: no    Mandell Pangborn

## 2019-08-01 NOTE — H&P (Signed)

## 2019-08-02 ENCOUNTER — Encounter: Payer: Self-pay | Admitting: *Deleted

## 2019-09-16 DIAGNOSIS — R1111 Vomiting without nausea: Secondary | ICD-10-CM | POA: Diagnosis not present

## 2019-09-16 DIAGNOSIS — I10 Essential (primary) hypertension: Secondary | ICD-10-CM | POA: Diagnosis not present

## 2019-09-16 DIAGNOSIS — R42 Dizziness and giddiness: Secondary | ICD-10-CM | POA: Diagnosis not present

## 2019-09-18 ENCOUNTER — Emergency Department (HOSPITAL_COMMUNITY): Payer: PPO

## 2019-09-18 ENCOUNTER — Other Ambulatory Visit: Payer: Self-pay

## 2019-09-18 ENCOUNTER — Ambulatory Visit (INDEPENDENT_AMBULATORY_CARE_PROVIDER_SITE_OTHER)
Admission: RE | Admit: 2019-09-18 | Discharge: 2019-09-18 | Disposition: A | Discharge status: Home / Self Care | Payer: PPO | Source: Ambulatory Visit | Attending: Family Medicine | Admitting: Family Medicine

## 2019-09-18 ENCOUNTER — Emergency Department (HOSPITAL_COMMUNITY)
Admission: EM | Admit: 2019-09-18 | Discharge: 2019-09-19 | Disposition: A | Discharge status: Home / Self Care | Payer: PPO | Attending: Emergency Medicine | Admitting: Emergency Medicine

## 2019-09-18 ENCOUNTER — Encounter (HOSPITAL_COMMUNITY): Payer: Self-pay | Admitting: Emergency Medicine

## 2019-09-18 ENCOUNTER — Telehealth: Payer: Self-pay | Admitting: Family Medicine

## 2019-09-18 ENCOUNTER — Encounter: Payer: Self-pay | Admitting: Family Medicine

## 2019-09-18 ENCOUNTER — Ambulatory Visit (INDEPENDENT_AMBULATORY_CARE_PROVIDER_SITE_OTHER): Payer: PPO | Admitting: Family Medicine

## 2019-09-18 VITALS — BP 124/70 | HR 63 | Temp 97.6°F | Ht 64.0 in | Wt 183.5 lb

## 2019-09-18 DIAGNOSIS — H9312 Tinnitus, left ear: Secondary | ICD-10-CM | POA: Diagnosis not present

## 2019-09-18 DIAGNOSIS — I10 Essential (primary) hypertension: Secondary | ICD-10-CM | POA: Diagnosis not present

## 2019-09-18 DIAGNOSIS — E785 Hyperlipidemia, unspecified: Secondary | ICD-10-CM | POA: Insufficient documentation

## 2019-09-18 DIAGNOSIS — R0902 Hypoxemia: Secondary | ICD-10-CM | POA: Diagnosis not present

## 2019-09-18 DIAGNOSIS — H8102 Meniere's disease, left ear: Secondary | ICD-10-CM | POA: Insufficient documentation

## 2019-09-18 DIAGNOSIS — R197 Diarrhea, unspecified: Secondary | ICD-10-CM | POA: Diagnosis not present

## 2019-09-18 DIAGNOSIS — I959 Hypotension, unspecified: Secondary | ICD-10-CM | POA: Diagnosis not present

## 2019-09-18 DIAGNOSIS — R112 Nausea with vomiting, unspecified: Secondary | ICD-10-CM | POA: Diagnosis not present

## 2019-09-18 DIAGNOSIS — R159 Full incontinence of feces: Secondary | ICD-10-CM | POA: Diagnosis not present

## 2019-09-18 DIAGNOSIS — R42 Dizziness and giddiness: Secondary | ICD-10-CM

## 2019-09-18 LAB — BASIC METABOLIC PANEL
Anion gap: 11 (ref 5–15)
BUN: 13 mg/dL (ref 8–23)
CO2: 23 mmol/L (ref 22–32)
Calcium: 9.3 mg/dL (ref 8.9–10.3)
Chloride: 108 mmol/L (ref 98–111)
Creatinine, Ser: 0.75 mg/dL (ref 0.44–1.00)
GFR calc Af Amer: >60 mL/min (ref >60)
GFR calc non Af Amer: >60 mL/min (ref >60)
Glucose, Bld: 131 mg/dL — ABNORMAL HIGH (ref 70–99)
Potassium: 3.7 mmol/L (ref 3.5–5.1)
Sodium: 142 mmol/L (ref 135–145)

## 2019-09-18 LAB — URINALYSIS, ROUTINE W REFLEX MICROSCOPIC
Bilirubin Urine: NEGATIVE
Glucose, UA: NEGATIVE mg/dL
Hgb urine dipstick: NEGATIVE
Ketones, ur: 5 mg/dL — AB
Leukocytes,Ua: NEGATIVE
Nitrite: NEGATIVE
Protein, ur: NEGATIVE mg/dL
Specific Gravity, Urine: 1.014 (ref 1.005–1.030)
pH: 6 (ref 5.0–8.0)

## 2019-09-18 LAB — CBC
HCT: 37.6 % (ref 36.0–46.0)
Hemoglobin: 11.9 g/dL — ABNORMAL LOW (ref 12.0–15.0)
MCH: 28.3 pg (ref 26.0–34.0)
MCHC: 31.6 g/dL (ref 30.0–36.0)
MCV: 89.5 fL (ref 80.0–100.0)
Platelets: 252 10*3/uL (ref 150–400)
RBC: 4.2 MIL/uL (ref 3.87–5.11)
RDW: 13.7 % (ref 11.5–15.5)
WBC: 10.2 10*3/uL (ref 4.0–10.5)
nRBC: 0 % (ref 0.0–0.2)

## 2019-09-18 LAB — CBG MONITORING, ED: Glucose-Capillary: 128 mg/dL — ABNORMAL HIGH (ref 70–99)

## 2019-09-18 MED ORDER — LORAZEPAM 2 MG/ML IJ SOLN
1.0000 mg | Freq: Once | INTRAMUSCULAR | Status: AC
  Administered 2019-09-18: 1 mg via INTRAVENOUS
  Filled 2019-09-18: qty 1

## 2019-09-18 MED ORDER — MECLIZINE HCL 25 MG PO TABS: 25.0000 mg | ORAL_TABLET | Freq: Three times a day (TID) | ORAL | 0 refills | Status: AC | PRN

## 2019-09-18 NOTE — Telephone Encounter (Signed)
Patient called today. She wanted to let you know that she had another dizzy spell and She is having trouble standing. Patient is there with her daughter in law and they have called the EMS to come out to take her to the hospital. Patient stated she did not know if she should go to [facility] or [facility] .

## 2019-09-18 NOTE — ED Triage Notes (Signed)
Per EMS-vomiting, dizziness on and off for 3 days-saw PCP today, CT done, which was negative-4 mg of Zofran given in route, improved nausea

## 2019-09-18 NOTE — ED Notes (Signed)
Pt transported to MRI 

## 2019-09-18 NOTE — Discharge Instructions (Signed)
You were evaluated in the Emergency Department and after careful evaluation, we did not find any emergent condition requiring admission or further testing in the hospital.  Your exam/testing today was overall reassuring.  Your MRI was normal.  Your symptoms are consistent with vertigo.  Please take the medication provided as needed and follow-up with your regular doctor.  Please return to the Emergency Department if you experience any worsening of your condition.  We encourage you to follow up with a primary care provider.  Thank you for allowing [place] to be a part of your care.

## 2019-09-18 NOTE — Patient Instructions (Addendum)
These are concerning symptoms.  Head CT and carotid ultrasound ordered today. Pending results we will likely refer you to the neurologist for further evaluation.  Stay well hydrated. If recurrent episode, go to ER.

## 2019-09-18 NOTE — Telephone Encounter (Signed)
I see she's currently at ER.  Will await their evalaution.  CT from today reassuring.  plz call tomorrow for an update.

## 2019-09-18 NOTE — Assessment & Plan Note (Signed)
Anticipate episodes of recurrent vertigo as of yet of unknown cause. Concerning progressive symptoms, episode 06/2019 associated with stool incontinence ?seizure. 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  Will check head CT r/o stroke, mass.  Strong fmhx stroke - check carotid US.  Will likely refer to neurology for further evaluation.  Doubt cardiac related.

## 2019-09-18 NOTE — ED Notes (Signed)
Dr Bero at bedside.  

## 2019-09-18 NOTE — ED Provider Notes (Signed)
Plan to f/u on MRI, if negative she can be discharged   Ripley Fraise, MD 09/18/19 2308

## 2019-09-18 NOTE — ED Provider Notes (Signed)
[facility] Emergency Department Provider Note MRN:  [id number]  Arrival date & time: 09/18/19     Chief Complaint   Dizziness   History of Present Illness   Yolanda Walsh is a 69 y.o. year-old female with a history of hyperlipidemia presenting to the ED with chief complaint of dizziness.  3 episodes of severe dizziness described as room spinning over the past 3 days.  Occurs randomly, not related to head position or standing.  Associated with nausea, vomiting, also occasional diarrhea.  Denies abdominal pain, no chest pain or shortness of breath, no headache, no other complaints.  Symptoms are severe when present but are intermittent.  Review of Systems  A complete 10 system review of systems was obtained and all systems are negative except as noted in the HPI and PMH.   Patient's Health History    Past Medical History:  Diagnosis Date  . Ankle fracture    right  . Depression   . History of anal fissures 2013   worsened by constipation   . History of hemorrhoids    w/o complications  . History of rectal fissure   . Hyperlipidemia 04/24/2012  . Lichen sclerosus    temovate cream  . Medial meniscus tear   . Osteopenia    dexa 2013, 2016  . Trigger finger     Past Surgical History:  Procedure Laterality Date  . CATARACT EXTRACTION W/PHACO Left 07/04/2019   Procedure: CATARACT EXTRACTION PHACO AND INTRAOCULAR LENS PLACEMENT (IOC) LEFT  6.67  00:44.0  15.2%;  Surgeon: Leandrew Koyanagi, MD;  Location: [place];  Service: Ophthalmology;  Laterality: Left;  . CATARACT EXTRACTION W/PHACO Right 08/01/2019   Procedure: CATARACT EXTRACTION PHACO AND INTRAOCULAR LENS PLACEMENT (IOC) RIGHT 5.33  00:45.4  11.8%;  Surgeon: Leandrew Koyanagi, MD;  Location: [facility];  Service: Ophthalmology;  Laterality: Right;  . COLONOSCOPY  09/2003  . COLONOSCOPY  07/2016   TA, diverticulosis, rpt 5 yrs Henrene Pastor)  . dexa  04/2012   T -2.1 hip,  -1.2 spine  . dexa  03/2015   T -2.4 hip, -1.7 spine  . I & D EXTREMITY Right 03/16/2014   cat bite hand s/p I&D in OR ([name])  . left hand surgery  1962   tendon laceration  . PARTIAL HYSTERECTOMY  1980   for prolapse, ovaries remain    Family History  Problem Relation Age of Onset  . Cancer Mother        leukemia  . Coronary artery disease Mother 59  . Coronary artery disease Father 64       MI  . Parkinsonism Brother 101       deceased  . Stroke Brother   . CAD Brother 43       7 stents  . Hypertension Brother   . Hypertension Sister   . Crohn's disease Sister   . Diabetes Maternal Grandfather   . CAD Other 27       stent    Social History   Socioeconomic History  . Marital status: Widowed    Spouse name: Not on file  . Number of children: Not on file  . Years of education: Not on file  . Highest education level: Not on file  Occupational History  . Not on file  Tobacco Use  . Smoking status: Never Smoker  . Smokeless tobacco: Never Used  Substance and Sexual Activity  . Alcohol use: Not Currently    Comment: rare  .  Drug use: No  . Sexual activity: Not on file  Other Topics Concern  . Not on file  Social History Narrative   Lives alone with cat and dog.   Widow, husband deceased from [place]. Brother died from PD.    Daughter lives in [place]. Mother of Antony Contras.    Occupation: retired, prior worked in [employer] daily at least 30 min    Diet: good water, fruits/vegetables daily - started eating daily harvest plant based soups and smoothies    Social Determinants of Radio broadcast assistant Strain: Low Risk   . Difficulty of Paying Living Expenses: Not hard at all  Food Insecurity: No Food Insecurity  . Worried About Charity fundraiser in the Last Year: Never true  . Ran Out of Food in the Last Year: Never true  Transportation Needs: No Transportation Needs  . Lack of Transportation (Medical): No  . Lack of Transportation  (Non-Medical): No  Physical Activity: Inactive  . Days of Exercise per Week: 0 days  . Minutes of Exercise per Session: 0 min  Stress: No Stress Concern Present  . Feeling of Stress : Not at all  Social Connections:   . Frequency of Communication with Friends and Family: Not on file  . Frequency of Social Gatherings with Friends and Family: Not on file  . Attends Religious Services: Not on file  . Active Member of Clubs or Organizations: Not on file  . Attends Archivist Meetings: Not on file  . Marital Status: Not on file  Intimate Partner Violence: Not At Risk  . Fear of Current or Ex-Partner: No  . Emotionally Abused: No  . Physically Abused: No  . Sexually Abused: No     Physical Exam   Vitals:   09/18/19 2000 09/18/19 2030  BP: 132/63 133/62  Pulse: 71 65  Resp: (!) 21 12  Temp:    SpO2: 95% 96%    CONSTITUTIONAL: Well-appearing, NAD NEURO:  Alert and oriented x 3, normal and symmetric strength and sensation, normal coordination, normal speech, no nystagmus, no visual field cuts, no neglect EYES:  eyes equal and reactive ENT/NECK:  no LAD, no JVD CARDIO: Regular rate, well-perfused, normal S1 and S2 PULM:  CTAB no wheezing or rhonchi GI/GU:  normal bowel sounds, non-distended, non-tender MSK/SPINE:  No gross deformities, no edema SKIN:  no rash, atraumatic PSYCH:  Appropriate speech and behavior  *Additional and/or pertinent findings included in MDM below  Diagnostic and Interventional Summary    EKG Interpretation  Date/Time:  09-18-2019 at 18: 43: 15 Ventricular Rate:  64 PR Interval: 144   QRS Duration: 90 QT Interval:  544 QTC Calculation: 562 R Axis:     Text Interpretation: Sinus rhythm, intervals appear normal Confirmed by Dr. Gerlene Fee at 7:23 PM       Labs Reviewed  BASIC METABOLIC PANEL - Abnormal; Notable for the following components:      Result Value   Glucose, Bld 131 (*)    All other components within normal limits  CBC -  Abnormal; Notable for the following components:   Hemoglobin 11.9 (*)    All other components within normal limits  URINALYSIS, ROUTINE W REFLEX MICROSCOPIC - Abnormal; Notable for the following components:   Ketones, ur 5 (*)    All other components within normal limits  CBG MONITORING, ED - Abnormal; Notable for the following components:   Glucose-Capillary 128 (*)    All other  components within normal limits    MR BRAIN WO CONTRAST    (Results Pending)  MR ANGIO HEAD WO CONTRAST    (Results Pending)    Medications  LORazepam (ATIVAN) injection 1 mg (1 mg Intravenous Given 09/18/19 2157)     Procedures  /  Critical Care Procedures  ED Course and Medical Decision Making  I have reviewed the triage vital signs, the nursing notes, and pertinent available records from the EMR.  Pertinent labs & imaging results that were available during my care of the patient were reviewed by me and considered in my medical decision making (see below for details).     Concern for central vertigo given age, risk factors, description of random episodes of vertigo.  Will need MRI to exclude.  10:58 PM update: Still awaiting MRI results, without acute stroke or signs of posterior circulation insufficiency, will be safe for discharge.  Signed out to oncoming provider at shift change.  Barth Kirks. Sedonia Small, MD [place] [email or web address]  Final Clinical Impressions(s) / ED Diagnoses     ICD-10-CM   1. Vertigo  R42     ED Discharge Orders         Ordered    meclizine (ANTIVERT) 25 MG tablet  3 times daily PRN     09/18/19 2258           Discharge Instructions Discussed with and Provided to Patient:     Discharge Instructions     You were evaluated in the Emergency Department and after careful evaluation, we did not find any emergent condition requiring admission or further testing in the hospital.  Your exam/testing today was overall  reassuring.  Your MRI was normal.  Your symptoms are consistent with vertigo.  Please take the medication provided as needed and follow-up with your regular doctor.  Please return to the Emergency Department if you experience any worsening of your condition.  We encourage you to follow up with a primary care provider.  Thank you for allowing [place] to be a part of your care.        Maudie Flakes, MD 09/18/19 2259

## 2019-09-18 NOTE — Progress Notes (Signed)
This visit was conducted in person.  BP 124/70 (BP Location: Left Arm, Patient Position: Sitting, Cuff Size: Normal)   Pulse 63   Temp 97.6 F (36.4 C) (Temporal)   Ht 5\' 4"  (1.626 m)   Wt 183 lb 8 oz (83.2 kg)   SpO2 99%   BMI 31.50 kg/m   Orthostatic VS for the past 24 hrs (Last 3 readings):  BP- Lying BP- Standing at 0 minutes  09/18/19 1023 - 126/80  09/18/19 1021 126/68 -    CC: dizziness Subjective:    Patient ID: Yolanda Walsh, female    DOB: [birth year], 69 y.o.   MRN: [id number]:[id number]  HPI: [name] is a 69 y.o. female presenting on 09/18/2019 for Dizziness (C/o dizziness.  Started on 09/16/19.  Called EMS, not transported.  Provided EKG.  Also, had nausea and vomiting.  Has had previous episodes of same sxs, last in 02/2019.)   2d h/o dizziness described as trouble focusing vision, started Saturday evening while watching TV. When she stood up felt very nauseated and imbalanced. Laid down on couch, then vomited x1. Room spinning vertigo. This persisted for 2 days, today starting to feel better but no appetite and nausea persists. Diarrhea yesterday. Feeling "puffiness" around eyes.   No pain, no racing heart, chest pain, dyspnea.  No neck or lower back pain. Possible L occipital soreness.  No known syncope, seizure activity, tongue biting.   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 She saw ENT and had normal MRI evaluating this (2014) with mention of "extremely minimal chronic microvascular ischemic disease".   Called EMS with normal EKG (scanned) and vitals with HR 66, BP 144/72, cg 120, O2 sat 98%, refused ER.   Prior similar symptoms 02/2019 thought presyncopal related to dehydration. labwork and EKG at that time was reassuring. Had another episode 06/2019 described as seeing white light associated with stool incontinence, after she ate too much salty ham.   Strong fmhx CAD, strokes (siblings, parents).  She stopped ctrestor 08/2018 - this was  restarted 02/2019. She is currently taking 5mg  MWF.     Relevant past medical, surgical, family and social history reviewed and updated as indicated. Interim medical history since our last visit reviewed. Allergies and medications reviewed and updated. Outpatient Medications Prior to Visit  Medication Sig Dispense Refill  . cetirizine (ZYRTEC) 10 MG tablet Take 10 mg by mouth daily.    . Cholecalciferol (VITAMIN D3) 1000 UNITS CAPS Take 1 capsule (1,000 Units total) by mouth daily. 30 capsule   . ibuprofen (ADVIL,MOTRIN) 200 MG tablet Take 200 mg by mouth every 6 (six) hours as needed.    . Multiple Vitamins-Minerals (PRESERVISION AREDS 2+MULTI VIT PO) Take 2 capsules by mouth daily.    . rosuvastatin (CRESTOR) 5 MG tablet Take 1 tablet (5 mg total) by mouth every Monday, Wednesday, and Friday. 40 tablet 3  . sertraline (ZOLOFT) 50 MG tablet Take 1 tablet (50 mg total) by mouth daily. 90 tablet 3   No facility-administered medications prior to visit.     Per HPI unless specifically indicated in ROS section below Review of Systems Objective:    BP 124/70 (BP Location: Left Arm, Patient Position: Sitting, Cuff Size: Normal)   Pulse 63   Temp 97.6 F (36.4 C) (Temporal)   Ht 5\' 4"  (1.626 m)   Wt 183 lb 8 oz (83.2 kg)   SpO2 99%   BMI 31.50 kg/m   Wt Readings from Last  3 Encounters:  09/18/19 183 lb 8 oz (83.2 kg)  08/01/19 182 lb (82.6 kg)  07/04/19 184 lb (83.5 kg)    Physical Exam Vitals and nursing note reviewed.  Constitutional:      Appearance: Normal appearance. She is not ill-appearing.  HENT:     Head: Normocephalic and atraumatic.     Right Ear: Tympanic membrane, ear canal and external ear normal. There is no impacted cerumen.     Left Ear: Tympanic membrane, ear canal and external ear normal. There is no impacted cerumen.     Mouth/Throat:     Mouth: Mucous membranes are moist.     Pharynx: Oropharynx is clear. No oropharyngeal exudate or posterior oropharyngeal  erythema.  Eyes:     Extraocular Movements: Extraocular movements intact.     Conjunctiva/sclera: Conjunctivae normal.     Pupils: Pupils are equal, round, and reactive to light.  Cardiovascular:     Rate and Rhythm: Normal rate and regular rhythm.     Pulses: Normal pulses.     Heart sounds: Normal heart sounds. No murmur.  Pulmonary:     Effort: Pulmonary effort is normal. No respiratory distress.     Breath sounds: Normal breath sounds. No wheezing, rhonchi or rales.  Musculoskeletal:     Right lower leg: No edema.     Left lower leg: No edema.  Skin:    General: Skin is warm and dry.     Capillary Refill: Capillary refill takes less than 2 seconds.     Findings: No erythema or rash.  Neurological:     General: No focal deficit present.     Mental Status: She is alert. Mental status is at baseline.     Cranial Nerves: Cranial nerves are intact.     Sensory: Sensation is intact.     Motor: Motor function is intact.     Coordination: Coordination is intact. Romberg sign negative. Coordination normal. Finger-Nose-Finger Test normal. Rapid alternating movements normal.     Gait: Gait is intact.     Comments:  CN 2-12 intact FTN intact EOMI Neg Dix Hallpike No pronator drift No dysdiadochokinesia  Psychiatric:        Mood and Affect: Mood normal.        Behavior: Behavior normal.       Lab Results  Component Value Date   CHOL 181 06/13/2019   HDL 51.20 06/13/2019   LDLCALC 111 (H) 06/13/2019   LDLDIRECT 170.0 03/23/2012   TRIG 97.0 06/13/2019   CHOLHDL 4 06/13/2019    Lab Results  Component Value Date   ALT 12 06/13/2019   AST 17 06/13/2019   ALKPHOS 64 06/13/2019   BILITOT 0.4 06/13/2019    Lab Results  Component Value Date   CREATININE 0.86 03/01/2019   BUN 12 03/01/2019   NA 138 03/01/2019   K 4.6 03/01/2019   CL 103 03/01/2019   CO2 27 03/01/2019    Lab Results  Component Value Date   WBC 6.6 03/01/2019   HGB 12.5 03/01/2019   HCT 38.3 03/01/2019    MCV 83.5 03/01/2019   PLT 257.0 03/01/2019    Lab Results  Component Value Date   TSH 2.46 03/01/2019    Assessment & Plan:  This visit occurred during the SARS-CoV-2 public health emergency.  Safety protocols were in place, including screening questions prior to the visit, additional usage of staff PPE, and extensive cleaning of exam room while observing appropriate contact time as indicated for  disinfecting solutions.   Problem List Items Addressed This Visit    Vertigo - Primary    Anticipate episodes of recurrent vertigo as of yet of unknown cause. Concerning progressive symptoms, episode 06/2019 associated with stool incontinence ?seizure. 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  Will check head CT r/o stroke, mass.  Strong fmhx stroke - check carotid US.  Will likely refer to neurology for further evaluation.  Doubt cardiac related.       Relevant Orders   CT Head Wo Contrast   VAS US CAROTID   Ambulatory referral to Neurology   Tinnitus   Relevant Orders   Ambulatory referral to Neurology    Other Visit Diagnoses    Full incontinence of feces       Relevant Orders   CT Head Wo Contrast       No orders of the defined types were placed in this encounter.  Orders Placed This Encounter  Procedures  . CT Head Wo Contrast    Standing Status:   Future    Standing Expiration Date:   12/18/2020    Order Specific Question:   ** REASON FOR EXAM (FREE TEXT)    Answer:   dizziness, vertigo, hearing loss, stool incontinence, nausea/vomiting    Order Specific Question:   Preferred imaging location?    Answer:   [place]    Order Specific Question:   Radiology Contrast Protocol - do NOT remove file path    Answer:   [email or web address]  . Ambulatory referral to Neurology    Referral Priority:   Routine    Referral Type:   Consultation    Referral Reason:   Specialty Services Required    Requested  Specialty:   Neurology    Number of Visits Requested:   1    Patient Instructions  These are concerning symptoms.  Head CT and carotid ultrasound ordered today. Pending results we will likely refer you to the neurologist for further evaluation.  Stay well hydrated. If recurrent episode, go to ER.    Follow up plan: Return if symptoms worsen or fail to improve.  Ria Bush, MD

## 2019-09-19 ENCOUNTER — Telehealth: Payer: Self-pay | Admitting: Family Medicine

## 2019-09-19 DIAGNOSIS — R42 Dizziness and giddiness: Secondary | ICD-10-CM

## 2019-09-19 DIAGNOSIS — H9319 Tinnitus, unspecified ear: Secondary | ICD-10-CM

## 2019-09-19 NOTE — Telephone Encounter (Signed)
Pt returning call.  States she is not feeling dizzy but is a bit nauseous.  Denies any vomiting.  Other than that, says she feels good.

## 2019-09-19 NOTE — ED Provider Notes (Signed)
MRI negative.  Patient improved.  She is ambulatory.  She will be discharged home, referred to ENT due to episodes of vertigo.  She has been prescribed Grace Blight, MD 09/19/19 0104

## 2019-09-19 NOTE — Telephone Encounter (Signed)
Antoinette from [facility] called stating patient needs a referral for ENT. She states a chart has already been created at [facility] for Dr. Melida Quitter. The referral fax number for his office is [phone]. If any questions arise for HTA, Lorenso Courier can be reached at [phone].

## 2019-09-19 NOTE — Telephone Encounter (Signed)
Lvm asking pt to call back.  Need an update on sxs after yesterday's ER visit.

## 2019-09-19 NOTE — ED Notes (Signed)
Dr. Wickline at bedside.  

## 2019-09-19 NOTE — Telephone Encounter (Signed)
Order placed

## 2019-09-21 DIAGNOSIS — R42 Dizziness and giddiness: Secondary | ICD-10-CM | POA: Diagnosis not present

## 2019-09-24 ENCOUNTER — Ambulatory Visit (HOSPITAL_COMMUNITY)
Admission: RE | Admit: 2019-09-24 | Discharge: 2019-09-24 | Disposition: A | Discharge status: Home / Self Care | Payer: PPO | Source: Ambulatory Visit | Attending: Cardiovascular Disease | Admitting: Cardiovascular Disease

## 2019-09-24 ENCOUNTER — Other Ambulatory Visit: Payer: Self-pay

## 2019-09-24 DIAGNOSIS — R42 Dizziness and giddiness: Secondary | ICD-10-CM | POA: Insufficient documentation

## 2019-10-07 ENCOUNTER — Encounter: Payer: Self-pay | Admitting: Family Medicine

## 2019-10-08 NOTE — Telephone Encounter (Signed)
Can we see if there's sooner neurology appt available?

## 2019-10-17 DIAGNOSIS — H903 Sensorineural hearing loss, bilateral: Secondary | ICD-10-CM | POA: Diagnosis not present

## 2019-10-17 DIAGNOSIS — R42 Dizziness and giddiness: Secondary | ICD-10-CM | POA: Diagnosis not present

## 2019-10-17 DIAGNOSIS — H9312 Tinnitus, left ear: Secondary | ICD-10-CM | POA: Diagnosis not present

## 2019-10-29 ENCOUNTER — Ambulatory Visit: Payer: PPO | Admitting: Neurology

## 2019-10-29 ENCOUNTER — Encounter: Payer: Self-pay | Admitting: Neurology

## 2019-10-29 ENCOUNTER — Other Ambulatory Visit: Payer: Self-pay

## 2019-10-29 VITALS — BP 112/68 | HR 64 | Temp 97.0°F | Ht 64.0 in | Wt 186.0 lb

## 2019-10-29 DIAGNOSIS — R42 Dizziness and giddiness: Secondary | ICD-10-CM | POA: Diagnosis not present

## 2019-10-29 DIAGNOSIS — H905 Unspecified sensorineural hearing loss: Secondary | ICD-10-CM | POA: Diagnosis not present

## 2019-10-29 DIAGNOSIS — H9192 Unspecified hearing loss, left ear: Secondary | ICD-10-CM | POA: Diagnosis not present

## 2019-10-29 MED ORDER — DIAZEPAM 5 MG PO TABS: 5.0000 mg | ORAL_TABLET | Freq: Four times a day (QID) | ORAL | 0 refills | Status: DC | PRN

## 2019-10-29 MED ORDER — TOPIRAMATE 50 MG PO TABS: 100.0000 mg | ORAL_TABLET | Freq: Every evening | ORAL | 11 refills | Status: DC

## 2019-10-29 MED ORDER — ONDANSETRON 4 MG PO TBDP: 4.0000 mg | ORAL_TABLET | Freq: Three times a day (TID) | ORAL | 6 refills | Status: DC | PRN

## 2019-10-29 NOTE — Progress Notes (Signed)
PATIENT: Yolanda Walsh DOB: 02/06/1951  Chief Complaint  Patient presents with  . Tinnitus/Vertigo    She has had persistent left ear ringing for years. Reports four episodes of intense dizziness, swimmy headedness and blurred vision. The episodes last anywhere from 15-30 minutes. She did have a loss of bowels with one event and vomiting with another. She does not have any dizziness with positional changes. She has underwent unremarkable CT head, MRI brain and MRA head.   Marland Kitchen PCP    Ria Bush, MD  . ENT    Jodi Marble, MD - referring provider      HISTORICAL  Yolanda Walsh is a 69 year old-year-old female, seen in request by ENT Dr. Jodi Marble and her primary care physician Dr.Gutierrez, Garlon Hatchet for evaluation of recurrent spells of vertigo, initial evaluation was on October 29, 2019.  I have reviewed and summarized the referring note from the referring physician.  She had a past medical history of anxiety, has been on stable dose of Zoloft 50 mg daily, denies a history of migraine headaches, but her 2 children has typical migraines.  She denies a previous history of vertigo, began to experience recurrent vertigo since August 2020,  First episode August 2020, she was helping her elderly sister then suddenly felt woozy sensation, swimmy headache, things were spinning around, lasted for 15 minutes, she denies passing out, no headache, improved after resting for a while.  Second episode was in December 2020, she missed her lunch, while driving, she felt hungry, but had sudden onset of nausea, vertigo, uncontrollable diarrhea, to the point of soiling her pants, she denied loss of consciousness, able to control her vehicle, no headaches  She had bilateral cataract surgery in December 2020, in January 2021.  3 recurrent episode of vertigo in February 2021  1 episode while she was watching TV, no triggers, then had another sudden onset of vertigo, felt her eyes were bouncing  around, she called 911, had nausea, vomiting,  Another episode in February 2021 happened while she was watching TV, again sudden onset of vertigo, dizziness, nausea vomiting, unsteady gait,  Most recent episode was on September 18, 2019, she was awakened from sleep felt her body was tilted, intense vertigo, she was able to manage to call her son, who witnessed the nystagmus, ambulance was called, she was taken to the emergency room, with nausea vomiting, symptoms last about 45 minutes, she denies headache  I personally reviewed CT head, and MRI of the brain, no acute abnormality  MRA of the brain was normal Ultrasound of carotid arteries showed no large vessel disease, anterograde flow of bilateral vertebral artery  Laboratory evaluations showed normal CBC, CMP, with exception of mild elevated glucose 131 LDL was 111 normal TSH  REVIEW OF SYSTEMS: Full 14 system review of systems performed and notable only for as above All other review of systems were negative.  ALLERGIES: No Known Allergies  HOME MEDICATIONS: Current Outpatient Medications  Medication Sig Dispense Refill  . cetirizine (ZYRTEC) 10 MG tablet Take 10 mg by mouth daily.    . Cholecalciferol (VITAMIN D3) 1000 UNITS CAPS Take 1 capsule (1,000 Units total) by mouth daily. 30 capsule   . ibuprofen (ADVIL,MOTRIN) 200 MG tablet Take 200 mg by mouth every 6 (six) hours as needed for mild pain or moderate pain.     . meclizine (ANTIVERT) 25 MG tablet Take 1 tablet (25 mg total) by mouth 3 (three) times daily as needed for dizziness. 30 tablet  0  . Multiple Vitamins-Minerals (PRESERVISION AREDS 2+MULTI VIT PO) Take 2 capsules by mouth daily.    . rosuvastatin (CRESTOR) 5 MG tablet Take 1 tablet (5 mg total) by mouth every Monday, Wednesday, and Friday. 40 tablet 3  . sertraline (ZOLOFT) 50 MG tablet Take 1 tablet (50 mg total) by mouth daily. 90 tablet 3   No current facility-administered medications for this visit.    PAST MEDICAL  HISTORY: Past Medical History:  Diagnosis Date  . Ankle fracture    right  . Depression   . History of anal fissures 2013   worsened by constipation   . History of hemorrhoids    w/o complications  . History of rectal fissure   . Hyperlipidemia 04/24/2012  . Lichen sclerosus    temovate cream  . Medial meniscus tear   . Osteopenia    dexa 2013, 2016  . Trigger finger   . Vertigo     PAST SURGICAL HISTORY: Past Surgical History:  Procedure Laterality Date  . CATARACT EXTRACTION W/PHACO Left 07/04/2019   Procedure: CATARACT EXTRACTION PHACO AND INTRAOCULAR LENS PLACEMENT (IOC) LEFT  6.67  00:44.0  15.2%;  Surgeon: Leandrew Koyanagi, MD;  Location: [facility];  Service: Ophthalmology;  Laterality: Left;  . CATARACT EXTRACTION W/PHACO Right 08/01/2019   Procedure: CATARACT EXTRACTION PHACO AND INTRAOCULAR LENS PLACEMENT (IOC) RIGHT 5.33  00:45.4  11.8%;  Surgeon: Leandrew Koyanagi, MD;  Location: [facility];  Service: Ophthalmology;  Laterality: Right;  . COLONOSCOPY  09/2003  . COLONOSCOPY  07/2016   TA, diverticulosis, rpt 5 yrs Henrene Pastor)  . dexa  04/2012   T -2.1 hip, -1.2 spine  . dexa  03/2015   T -2.4 hip, -1.7 spine  . I & D EXTREMITY Right 03/16/2014   cat bite hand s/p I&D in OR ([name])  . left hand surgery  1962   tendon laceration  . PARTIAL HYSTERECTOMY  1980   for prolapse, ovaries remain    FAMILY HISTORY: Family History  Problem Relation Age of Onset  . Cancer Mother        leukemia  . Coronary artery disease Mother 69  . Coronary artery disease Father 59       MI  . Parkinsonism Brother 36       deceased  . Stroke Brother   . CAD Brother 39       7 stents  . Hypertension Brother   . Hypertension Sister   . Crohn's disease Sister   . Diabetes Maternal Grandfather   . CAD Other 29       stent    SOCIAL HISTORY: Social History   Socioeconomic History  . Marital status: Widowed    Spouse name: Not on file  . Number of  children: 2  . Years of education: 11  . Highest education level: High school graduate  Occupational History  . Occupation: Retired  Tobacco Use  . Smoking status: Never Smoker  . Smokeless tobacco: Never Used  Substance and Sexual Activity  . Alcohol use: Not Currently    Comment: rare  . Drug use: No  . Sexual activity: Not on file  Other Topics Concern  . Not on file  Social History Narrative   Lives alone with cat.   Widow, husband deceased from [place]. Brother died from PD.    Daughter lives in [place]. Mother of Antony Contras.    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   No daily use of caffeine.   Social Determinants of Health   Financial Resource Strain: Low Risk   . Difficulty of Paying Living Expenses: Not hard at all  Food Insecurity: No Food Insecurity  . Worried About Charity fundraiser in the Last Year: Never true  . Ran Out of Food in the Last Year: Never true  Transportation Needs: No Transportation Needs  . Lack of Transportation (Medical): No  . Lack of Transportation (Non-Medical): No  Physical Activity: Inactive  . Days of Exercise per Week: 0 days  . Minutes of Exercise per Session: 0 min  Stress: No Stress Concern Present  . Feeling of Stress : Not at all  Social Connections:   . Frequency of Communication with Friends and Family:   . Frequency of Social Gatherings with Friends and Family:   . Attends Religious Services:   . Active Member of Clubs or Organizations:   . Attends Archivist Meetings:   Marland Kitchen Marital Status:   Intimate Partner Violence: Not At Risk  . Fear of Current or Ex-Partner: No  . Emotionally Abused: No  . Physically Abused: No  . Sexually Abused: No     PHYSICAL EXAM   Vitals:   10/29/19 1332  BP: 112/68  Pulse: 64  Temp: (!) 97 F  (36.1 C)  Weight: 186 lb (84.4 kg)  Height: 5\' 4"  (1.626 m)    Not recorded      Body mass index is 31.93 kg/m.  PHYSICAL EXAMNIATION:  Gen: NAD, conversant, well nourised, well groomed                     Cardiovascular: Regular rate rhythm, no peripheral edema, warm, nontender. Eyes: Conjunctivae clear without exudates or hemorrhage Neck: Supple, no carotid bruits. Pulmonary: Clear to auscultation bilaterally   NEUROLOGICAL EXAM:  MENTAL STATUS: Speech:    Speech is normal; fluent and spontaneous with normal comprehension.  Cognition:     Orientation to time, place and person     Normal recent and remote memory     Normal Attention span and concentration     Normal Language, naming, repeating,spontaneous speech     Fund of knowledge   CRANIAL NERVES: CN II: Visual fields are full to confrontation. Pupils are round equal and briskly reactive to light. CN III, IV, VI: extraocular movement are normal. No ptosis. CN V: Facial sensation is intact to light touch CN VII: Face is symmetric with normal eye closure  CN VIII: Hearing is normal to causal conversation. CN IX, X: Phonation is normal. CN XI: Head turning and shoulder shrug are intact  MOTOR: There is no pronator drift of out-stretched arms. Muscle bulk and tone are normal. Muscle strength is normal.  REFLEXES: Reflexes are 2+ and symmetric at the biceps, triceps, knees, and ankles. Plantar responses are flexor.  SENSORY: Intact to light touch, pinprick and vibratory sensation are intact in fingers and toes.  COORDINATION: There is no trunk or limb dysmetria noted.  GAIT/STANCE: Posture is normal. Gait is steady with normal steps, base, arm swing, and turning. Heel and toe walking are normal. Tandem gait is normal.  Romberg is absent.   DIAGNOSTIC DATA (LABS, IMAGING, TESTING) - I reviewed patient records, labs, notes, testing and imaging myself where available.   ASSESSMENT AND PLAN  Vincent  KENEDIE GAJDA  is a 69 y.o. female   Recurrent episode of acute onset of vertigo  Neurological examinations, MRI of the brain, MRA of brain, ultrasound of carotid artery  Differential diagnosis include migraine variant versus partial seizure  EEG  Starting Topamax 50 mg titrating to 100 mg every night as preventive medication  Needed Zofran, Valium for abortive treatment       Marcial Pacas, M.D. Ph.D.  [facility] [address], [place], [place] [address]  Ph: [phone] Fax: [phone]  CC: Jodi Marble, MD

## 2019-10-30 ENCOUNTER — Telehealth: Payer: Self-pay | Admitting: Neurology

## 2019-10-30 DIAGNOSIS — R42 Dizziness and giddiness: Secondary | ICD-10-CM

## 2019-10-30 NOTE — Telephone Encounter (Signed)
Manuela Schwartz from Radiology called stating that she spoke to [facility] in the MRI dept and she states the MRI needs to be with and without contrast. Manuela Schwartz states that when you call her back if she does not pick up have whomever you speak with send her a  Message.

## 2019-10-30 NOTE — Telephone Encounter (Signed)
Since the patient had her MRI Brain wo contrast at [facility] I asked if they were able to do the brain with contrast . Yolanda Walsh called me back and stated they can not because it has been to long. The order needs to be MRI Brain w/wo contrast. Thank you!

## 2019-10-30 NOTE — Telephone Encounter (Signed)
I put in order for MRI brain w/wo thin cut through IAC.

## 2019-10-30 NOTE — Telephone Encounter (Signed)
Noted, health team order sent to GI. They will reach out to the patient to schedule.

## 2019-10-31 ENCOUNTER — Telehealth: Payer: Self-pay | Admitting: Neurology

## 2019-10-31 NOTE — Telephone Encounter (Signed)
I called patient and LVM to schedule EEG.

## 2019-11-07 ENCOUNTER — Other Ambulatory Visit: Payer: Self-pay

## 2019-11-07 ENCOUNTER — Ambulatory Visit
Admission: RE | Admit: 2019-11-07 | Discharge: 2019-11-07 | Disposition: A | Discharge status: Home / Self Care | Payer: PPO | Source: Ambulatory Visit | Attending: Neurology | Admitting: Neurology

## 2019-11-07 DIAGNOSIS — R42 Dizziness and giddiness: Secondary | ICD-10-CM | POA: Diagnosis not present

## 2019-11-07 MED ORDER — GADOBENATE DIMEGLUMINE 529 MG/ML IV SOLN
18.0000 mL | Freq: Once | INTRAVENOUS | Status: AC | PRN
  Administered 2019-11-07: 18 mL via INTRAVENOUS

## 2019-11-08 ENCOUNTER — Telehealth: Payer: Self-pay | Admitting: Neurology

## 2019-11-08 NOTE — Telephone Encounter (Signed)
MRI report was sent to [facility].

## 2019-11-28 ENCOUNTER — Ambulatory Visit: Payer: PPO | Admitting: Neurology

## 2019-11-28 ENCOUNTER — Other Ambulatory Visit: Payer: Self-pay

## 2019-11-28 DIAGNOSIS — R410 Disorientation, unspecified: Secondary | ICD-10-CM

## 2019-11-28 DIAGNOSIS — R42 Dizziness and giddiness: Secondary | ICD-10-CM

## 2019-11-28 DIAGNOSIS — H905 Unspecified sensorineural hearing loss: Secondary | ICD-10-CM

## 2019-11-28 DIAGNOSIS — H9192 Unspecified hearing loss, left ear: Secondary | ICD-10-CM

## 2019-12-13 NOTE — Procedures (Signed)
   HISTORY: 69 year old female presented with recurrent episode of acute onset dizziness  TECHNIQUE:  This is a routine 16 channel EEG recording with one channel devoted to a limited EKG recording.  It was performed during wakefulness, drowsiness and asleep.  Hyperventilation and photic stimulation were performed as activating procedures.  There are minimum muscle and movement artifact noted.  Upon maximum arousal, posterior dominant waking rhythm consistent of rhythmic alpha range activity, with frequency of 9 hz. Activities are symmetric over the bilateral posterior derivations and attenuated with eye opening.  Hyperventilation produced mild/moderate buildup with higher amplitude and the slower activities noted.  Photic stimulation did not alter the tracing.  During EEG recording, patient developed drowsiness and no deeper stage of sleep was achieved During EEG recording, there was no epileptiform discharge noted.  EKG demonstrate sinus rhythm, with heart rate of 68 bpm  CONCLUSION: This is a  normal awake EEG.  There is no electrodiagnostic evidence of epileptiform discharge.  Marcial Pacas, M.D. Ph.D.  [facility] [place], [place] [address] Phone: [phone] Fax:      [phone]

## 2020-01-31 ENCOUNTER — Ambulatory Visit: Payer: PPO | Admitting: Psychology

## 2020-03-10 DIAGNOSIS — H353132 Nonexudative age-related macular degeneration, bilateral, intermediate dry stage: Secondary | ICD-10-CM | POA: Diagnosis not present

## 2020-04-07 ENCOUNTER — Other Ambulatory Visit: Payer: PPO

## 2020-04-24 DIAGNOSIS — Z1231 Encounter for screening mammogram for malignant neoplasm of breast: Secondary | ICD-10-CM | POA: Diagnosis not present

## 2020-04-24 LAB — HM MAMMOGRAPHY

## 2020-04-30 ENCOUNTER — Encounter: Payer: Self-pay | Admitting: Family Medicine

## 2020-05-06 DIAGNOSIS — Z01411 Encounter for gynecological examination (general) (routine) with abnormal findings: Secondary | ICD-10-CM | POA: Diagnosis not present

## 2020-05-06 DIAGNOSIS — L900 Lichen sclerosus et atrophicus: Secondary | ICD-10-CM | POA: Diagnosis not present

## 2020-05-06 DIAGNOSIS — Z01419 Encounter for gynecological examination (general) (routine) without abnormal findings: Secondary | ICD-10-CM | POA: Diagnosis not present

## 2020-05-12 ENCOUNTER — Telehealth: Payer: Self-pay

## 2020-05-12 ENCOUNTER — Telehealth: Payer: Self-pay | Admitting: Neurology

## 2020-05-12 NOTE — Telephone Encounter (Signed)
Noted. Agree.

## 2020-05-12 NOTE — Telephone Encounter (Signed)
Reports having five days of dizziness following her COVID-19 booster last week. States the same thing happened after her first and second doses of the vaccine. She tried meclizine, ondansetron with not much improvement in symptoms until today. States she took one dose of topiramate yesterday and wanted to know if she could use it prn. I educated her about the medication. She actually does not wish to take it on a daily basis. She also says her dizziness has now resolved. She declined the offer to schedule a follow up here. She has a pending appt with ENT next week. She will call back if she feels she needs to be seen.

## 2020-05-12 NOTE — Addendum Note (Signed)
Addended by: Noberto Retort C on: 05/12/2020 03:08 PM   Modules accepted: Orders

## 2020-05-12 NOTE — Telephone Encounter (Signed)
Pt states she took one of her topiramate (TOPAMAX) 50 MG tablet on yesterday for her dizziness and it helped.  Pt is asking if its ok if she starts back on her topiramate (TOPAMAX) 50 MG tablet.  Please call

## 2020-05-12 NOTE — Telephone Encounter (Signed)
Pt said about 5 days ago vertigo and dizziness restarted; pt restarted the topamax again and that has helped dizziness. Pt wants to know since all the test done earlier in the year were OK should pt take topamax regularly. Pt did not return to Dr Krista Blue in July 2021 as requested by avs from 10/29/19 visit with Dr Krista Blue. Pt will contact Dr Rhea Belton office and discuss the most recent dizziness and pts questions about topamax since Dr Krista Blue prescribed that med. UC & ED precautions given and pt voiced understanding. FYI to Dr Darnell Level.

## 2020-05-12 NOTE — Telephone Encounter (Signed)
[facility] - Client TELEPHONE ADVICE RECORD [facility] Patient Name: Yolanda Walsh Gender: Female DOB: [birth year] Age: 69 Y 47 M 12 D Return Phone Number: [phone] (Primary), [phone] (Secondary) Address[place] [address] Client  [facility] - Client Client Site [place] Physician Ria Bush - MD Contact Type Call Who Is Calling Patient / Member / Family / Caregiver Call Type Triage / Clinical Relationship To Patient Self Return Phone Number [phone] (Primary) Chief Complaint Dizziness Reason for Call Symptomatic / Request for Yolanda Walsh states she is dizzy, light headed, possible vertigo, and wondering what to do. Translation No Nurse Assessment Nurse: Melina Modena, RN, Estill Bamberg Date/Time Eilene Ghazi Time): 05/12/2020 8:25:16 AM Confirm and document reason for call. If symptomatic, describe symptoms. ---Caller states she dizzziness Does the patient have any new or worsening symptoms? ---Yes Will a triage be completed? ---Yes Related visit to physician within the last 2 weeks? ---Yes Does the PT have any chronic conditions? (i.e. diabetes, asthma, this includes High risk factors for pregnancy, etc.) ---Yes List chronic conditions. ---Dizzy Is this a behavioral health or substance abuse call? ---No Guidelines Guideline Title Affirmed Question Affirmed Notes Nurse Date/Time (Eastern Time) Dizziness - Vertigo [1] NO dizziness now AND [2] one or more stroke risk factors (i.e., hypertension, diabetes, prior stroke/TIA/heart attack) [place], RN, Estill Bamberg 05/12/2020 8:28:21 AM Disp. Time Eilene Ghazi Time) Disposition Final User 05/12/2020 8:31:51 AM See PCP within 24 Hours Yes Melina Modena, RN, [employer] Disagree/Comply Comply PLEASE NOTE: All timestamps contained within this report are represented as [place] Standard Time. CONFIDENTIALTY NOTICE:  This fax transmission is intended only for the addressee. It contains information that is legally privileged, confidential or otherwise protected from use or disclosure. If you are not the intended recipient, you are strictly prohibited from reviewing, disclosing, copying using or disseminating any of this information or taking any action in reliance on or regarding this information. If you have received this fax in error, please notify us immediately by telephone so that we can arrange for its return to [place]. Phone: [phone], Toll-Free: [phone], Fax: [phone] Page: 2 of 2 Call Id: [id number] [place] Understands Yes PreDisposition Call Doctor Care Advice Given Per Guideline SEE PCP WITHIN 24 HOURS: FALL PREVENTION: CALL BACK IF: * Severe headache occurs CARE ADVICE given per Dizziness - Vertigo (Adult) guideline. Comments User: Tristan Schroeder, RN Date/Time Eilene Ghazi Time): 05/12/2020 8:36:03 AM Caller states she has a question about her meidcine. Please call Referrals REFERRED TO PCP OFFICE

## 2020-05-23 DIAGNOSIS — R42 Dizziness and giddiness: Secondary | ICD-10-CM | POA: Diagnosis not present

## 2020-05-23 DIAGNOSIS — H903 Sensorineural hearing loss, bilateral: Secondary | ICD-10-CM | POA: Diagnosis not present

## 2020-06-02 ENCOUNTER — Other Ambulatory Visit: Payer: Self-pay

## 2020-06-02 ENCOUNTER — Emergency Department (HOSPITAL_COMMUNITY)
Admission: EM | Admit: 2020-06-02 | Discharge: 2020-06-02 | Disposition: A | Discharge status: Home / Self Care | Payer: PPO | Attending: Emergency Medicine | Admitting: Emergency Medicine

## 2020-06-02 ENCOUNTER — Ambulatory Visit: Payer: PPO | Admitting: Family Medicine

## 2020-06-02 ENCOUNTER — Telehealth: Payer: Self-pay

## 2020-06-02 ENCOUNTER — Emergency Department (HOSPITAL_COMMUNITY): Payer: PPO

## 2020-06-02 ENCOUNTER — Encounter (HOSPITAL_COMMUNITY): Payer: Self-pay | Admitting: Emergency Medicine

## 2020-06-02 DIAGNOSIS — R109 Unspecified abdominal pain: Secondary | ICD-10-CM | POA: Insufficient documentation

## 2020-06-02 DIAGNOSIS — K429 Umbilical hernia without obstruction or gangrene: Secondary | ICD-10-CM | POA: Diagnosis not present

## 2020-06-02 DIAGNOSIS — Z79899 Other long term (current) drug therapy: Secondary | ICD-10-CM | POA: Diagnosis not present

## 2020-06-02 DIAGNOSIS — N281 Cyst of kidney, acquired: Secondary | ICD-10-CM | POA: Diagnosis not present

## 2020-06-02 DIAGNOSIS — M546 Pain in thoracic spine: Secondary | ICD-10-CM

## 2020-06-02 DIAGNOSIS — I517 Cardiomegaly: Secondary | ICD-10-CM | POA: Diagnosis not present

## 2020-06-02 DIAGNOSIS — R42 Dizziness and giddiness: Secondary | ICD-10-CM | POA: Diagnosis not present

## 2020-06-02 DIAGNOSIS — Z90710 Acquired absence of both cervix and uterus: Secondary | ICD-10-CM | POA: Diagnosis not present

## 2020-06-02 DIAGNOSIS — R55 Syncope and collapse: Secondary | ICD-10-CM | POA: Diagnosis not present

## 2020-06-02 DIAGNOSIS — R072 Precordial pain: Secondary | ICD-10-CM | POA: Insufficient documentation

## 2020-06-02 DIAGNOSIS — I2510 Atherosclerotic heart disease of native coronary artery without angina pectoris: Secondary | ICD-10-CM | POA: Diagnosis not present

## 2020-06-02 DIAGNOSIS — M545 Low back pain, unspecified: Secondary | ICD-10-CM | POA: Diagnosis present

## 2020-06-02 LAB — CBC
HCT: 41.9 % (ref 36.0–46.0)
Hemoglobin: 13.5 g/dL (ref 12.0–15.0)
MCH: 27.9 pg (ref 26.0–34.0)
MCHC: 32.2 g/dL (ref 30.0–36.0)
MCV: 86.6 fL (ref 80.0–100.0)
Platelets: 396 10*3/uL (ref 150–400)
RBC: 4.84 MIL/uL (ref 3.87–5.11)
RDW: 13.1 % (ref 11.5–15.5)
WBC: 15.5 10*3/uL — ABNORMAL HIGH (ref 4.0–10.5)
nRBC: 0 % (ref 0.0–0.2)

## 2020-06-02 LAB — BASIC METABOLIC PANEL
Anion gap: 16 — ABNORMAL HIGH (ref 5–15)
BUN: 34 mg/dL — ABNORMAL HIGH (ref 8–23)
CO2: 18 mmol/L — ABNORMAL LOW (ref 22–32)
Calcium: 10.1 mg/dL (ref 8.9–10.3)
Chloride: 100 mmol/L (ref 98–111)
Creatinine, Ser: 1.06 mg/dL — ABNORMAL HIGH (ref 0.44–1.00)
GFR, Estimated: 57 mL/min — ABNORMAL LOW (ref >60)
Glucose, Bld: 158 mg/dL — ABNORMAL HIGH (ref 70–99)
Potassium: 4.2 mmol/L (ref 3.5–5.1)
Sodium: 134 mmol/L — ABNORMAL LOW (ref 135–145)

## 2020-06-02 LAB — URINALYSIS, ROUTINE W REFLEX MICROSCOPIC
Bilirubin Urine: NEGATIVE
Glucose, UA: NEGATIVE mg/dL
Hgb urine dipstick: NEGATIVE
Ketones, ur: NEGATIVE mg/dL
Leukocytes,Ua: NEGATIVE
Nitrite: NEGATIVE
Protein, ur: NEGATIVE mg/dL
Specific Gravity, Urine: 1.018 (ref 1.005–1.030)
pH: 5 (ref 5.0–8.0)

## 2020-06-02 LAB — CBG MONITORING, ED: Glucose-Capillary: 106 mg/dL — ABNORMAL HIGH (ref 70–99)

## 2020-06-02 MED ORDER — IOHEXOL 350 MG/ML SOLN
100.0000 mL | Freq: Once | INTRAVENOUS | Status: AC | PRN
  Administered 2020-06-02: 100 mL via INTRAVENOUS

## 2020-06-02 MED ORDER — KETOROLAC TROMETHAMINE 30 MG/ML IJ SOLN
30.0000 mg | Freq: Once | INTRAMUSCULAR | Status: AC
  Administered 2020-06-02: 30 mg via INTRAVENOUS
  Filled 2020-06-02: qty 1

## 2020-06-02 NOTE — Telephone Encounter (Signed)
H/o meniere's, but if new syncope, agree with urgent eval.  Will await ER eval.

## 2020-06-02 NOTE — ED Provider Notes (Signed)
[facility] Provider Note  CSN: [id number] Arrival date & time: 06/02/20 1123    History Chief Complaint  Patient presents with  . Dizziness    HPI  Yolanda Walsh is a 69 y.o. female with history of vertigo, initially diagnosed in March, evaluated by ENT and Neurology with neg workup including MRI and EEG,  was improved for several months but had recurrence of symptoms several weeks ago. Seen by ENT 10 days ago and started on Dyazide and Prednisone taper, some concern for Meniere's disease as well. She was feeling better 2 days ago, but yesterday while she was in the shower she had a sudden loss of consciousness without prodromal symptoms. Woke up after a few seconds on the floor of her bathroom and on top of the handheld showerhead. She has been having mid back pain since then, radiating into her chest. She is unsure if she hit her head or not. She denies any continued lightheaded since yesterday but came to the ED for evaluation of the back pain.   Past Medical History:  Diagnosis Date  . Ankle fracture    right  . Depression   . History of anal fissures 2013   worsened by constipation   . History of hemorrhoids    w/o complications  . History of rectal fissure   . Hyperlipidemia 04/24/2012  . Lichen sclerosus    temovate cream  . Medial meniscus tear   . Osteopenia    dexa 2013, 2016  . Trigger finger   . Vertigo     Past Surgical History:  Procedure Laterality Date  . CATARACT EXTRACTION W/PHACO Left 07/04/2019   Procedure: CATARACT EXTRACTION PHACO AND INTRAOCULAR LENS PLACEMENT (IOC) LEFT  6.67  00:44.0  15.2%;  Surgeon: Leandrew Koyanagi, MD;  Location: [facility];  Service: Ophthalmology;  Laterality: Left;  . CATARACT EXTRACTION W/PHACO Right 08/01/2019   Procedure: CATARACT EXTRACTION PHACO AND INTRAOCULAR LENS PLACEMENT (IOC) RIGHT 5.33  00:45.4  11.8%;  Surgeon: Leandrew Koyanagi, MD;  Location: [facility];   Service: Ophthalmology;  Laterality: Right;  . COLONOSCOPY  09/2003  . COLONOSCOPY  07/2016   TA, diverticulosis, rpt 5 yrs Henrene Pastor)  . dexa  04/2012   T -2.1 hip, -1.2 spine  . dexa  03/2015   T -2.4 hip, -1.7 spine  . I & D EXTREMITY Right 03/16/2014   cat bite hand s/p I&D in OR ([name])  . left hand surgery  1962   tendon laceration  . PARTIAL HYSTERECTOMY  1980   for prolapse, ovaries remain    Family History  Problem Relation Age of Onset  . Cancer Mother        leukemia  . Coronary artery disease Mother 16  . Coronary artery disease Father 34       MI  . Parkinsonism Brother 50       deceased  . Stroke Brother   . CAD Brother 59       7 stents  . Hypertension Brother   . Hypertension Sister   . Crohn's disease Sister   . Diabetes Maternal Grandfather   . CAD Other 45       stent    Social History   Tobacco Use  . Smoking status: Never Smoker  . Smokeless tobacco: Never Used  Vaping Use  . Vaping Use: Never used  Substance Use Topics  . Alcohol use: Not Currently    Comment: rare  . Drug use:  No     Home Medications Prior to Admission medications   Medication Sig Start Date End Date Taking? Authorizing Provider  cetirizine (ZYRTEC) 10 MG tablet Take 10 mg by mouth daily.    [provider]  Cholecalciferol (VITAMIN D3) 1000 UNITS CAPS Take 1 capsule (1,000 Units total) by mouth daily. 04/12/15   Ria Bush, MD  diazepam (VALIUM) 5 MG tablet Take 1 tablet (5 mg total) by mouth every 6 (six) hours as needed (dizziness). 10/29/19   Marcial Pacas, MD  ibuprofen (ADVIL,MOTRIN) 200 MG tablet Take 200 mg by mouth every 6 (six) hours as needed for mild pain or moderate pain.     [provider]  meclizine (ANTIVERT) 25 MG tablet Take 1 tablet (25 mg total) by mouth 3 (three) times daily as needed for dizziness. 09/18/19   Maudie Flakes, MD  Multiple Vitamins-Minerals (PRESERVISION AREDS 2+MULTI VIT PO) Take 2 capsules by mouth daily.    [provider]  ondansetron (ZOFRAN ODT) 4 MG disintegrating tablet Take 1 tablet (4 mg total) by mouth every 8 (eight) hours as needed. 10/29/19   Marcial Pacas, MD  rosuvastatin (CRESTOR) 5 MG tablet Take 1 tablet (5 mg total) by mouth every Monday, Wednesday, and Friday. 06/18/19   Ria Bush, MD  sertraline (ZOLOFT) 50 MG tablet Take 1 tablet (50 mg total) by mouth daily. 06/18/19   Ria Bush, MD     Allergies    Patient has no known allergies.   Review of Systems   Review of Systems A comprehensive review of systems was completed and negative except as noted in HPI.    Physical Exam BP 128/78   Pulse 64   Temp 98.1 F (36.7 C) (Oral)   Resp 17   Ht 5\' 4"  (1.626 m)   Wt 81.2 kg   SpO2 98%   BMI 30.73 kg/m   Physical Exam Vitals and nursing note reviewed.  Constitutional:      Appearance: Normal appearance.  HENT:     Head: Normocephalic and atraumatic.     Nose: Nose normal.     Mouth/Throat:     Mouth: Mucous membranes are moist.  Eyes:     Extraocular Movements: Extraocular movements intact.     Conjunctiva/sclera: Conjunctivae normal.  Cardiovascular:     Rate and Rhythm: Normal rate.  Pulmonary:     Effort: Pulmonary effort is normal.     Breath sounds: Normal breath sounds.  Chest:     Chest wall: Tenderness (L parasternal) present.  Abdominal:     General: Abdomen is flat.     Palpations: Abdomen is soft.     Tenderness: There is no abdominal tenderness.  Musculoskeletal:        General: Tenderness (midline t-spine) present. No swelling. Normal range of motion.     Cervical back: Neck supple.  Skin:    General: Skin is warm and dry.  Neurological:     General: No focal deficit present.     Mental Status: She is alert.  Psychiatric:        Mood and Affect: Mood normal.      ED Results / Procedures / Treatments   Labs (all labs ordered are listed, but only abnormal results are displayed) Labs Reviewed  BASIC METABOLIC PANEL -  Abnormal; Notable for the following components:      Result Value   Sodium 134 (*)    CO2 18 (*)    Glucose, Bld 158 (*)  BUN 34 (*)    Creatinine, Ser 1.06 (*)    GFR, Estimated 57 (*)    Anion gap 16 (*)    All other components within normal limits  CBC - Abnormal; Notable for the following components:   WBC 15.5 (*)    All other components within normal limits  URINALYSIS, ROUTINE W REFLEX MICROSCOPIC - Abnormal; Notable for the following components:   APPearance HAZY (*)    All other components within normal limits  CBG MONITORING, ED - Abnormal; Notable for the following components:   Glucose-Capillary 106 (*)    All other components within normal limits    EKG EKG Interpretation  Date/Time:  Monday June 02 2020 11:25:02 EST Ventricular Rate:  86 PR Interval:  136 QRS Duration: 80 QT Interval:  336 QTC Calculation: 402 R Axis:   57 Text Interpretation: Normal sinus rhythm Nonspecific T wave abnormality Abnormal ECG No significant change since last tracing Confirmed by Calvert Cantor [phone]) on 06/02/2020 7:45:05 PM   Radiology CT Head Wo Contrast  Result Date: 06/02/2020 CLINICAL DATA:  Head trauma and dizziness EXAM: CT HEAD WITHOUT CONTRAST TECHNIQUE: Contiguous axial images were obtained from the base of the skull through the vertex without intravenous contrast. COMPARISON:  09/18/2019 FINDINGS: Brain: There is no mass, hemorrhage or extra-axial collection. The size and configuration of the ventricles and extra-axial CSF spaces are normal. The brain parenchyma is normal, without acute or chronic infarction. Vascular: No abnormal hyperdensity of the major intracranial arteries or dural venous sinuses. No intracranial atherosclerosis. Skull: The visualized skull base, calvarium and extracranial soft tissues are normal. Sinuses/Orbits: No fluid levels or advanced mucosal thickening of the visualized paranasal sinuses. No mastoid or middle ear effusion. The orbits  are normal. IMPRESSION: Normal head CT. Electronically Signed   By: Ulyses Jarred M.D.   On: 06/02/2020 21:29   CT Angio Chest/Abd/Pel for Dissection W and/or Wo Contrast  Result Date: 06/02/2020 CLINICAL DATA:  Abdominal pain.  Concern for aortic dissection. EXAM: CT ANGIOGRAPHY CHEST, ABDOMEN AND PELVIS TECHNIQUE: Non-contrast CT of the chest was initially obtained. Multidetector CT imaging through the chest, abdomen and pelvis was performed using the standard protocol during bolus administration of intravenous contrast. Multiplanar reconstructed images and MIPs were obtained and reviewed to evaluate the vascular anatomy. CONTRAST:  154mL OMNIPAQUE IOHEXOL 350 MG/ML SOLN COMPARISON:  None. FINDINGS: CTA CHEST FINDINGS Cardiovascular: There is no evidence for thoracic aortic aneurysm or dissection. The heart size is mildly enlarged. Subtle coronary artery calcifications are noted. There is no large centrally located pulmonary embolism. The arch vessels are grossly patent where visualized. Mediastinum/Nodes: -- No mediastinal lymphadenopathy. -- No hilar lymphadenopathy. -- No axillary lymphadenopathy. -- No supraclavicular lymphadenopathy. -- Normal thyroid gland where visualized. -  Unremarkable esophagus. Lungs/Pleura: Airways are patent. No pleural effusion, lobar consolidation, pneumothorax or pulmonary infarction. Musculoskeletal: No chest wall abnormality. No bony spinal canal stenosis. Review of the MIP images confirms the above findings. CTA ABDOMEN AND PELVIS FINDINGS VASCULAR Aorta: Normal caliber aorta without aneurysm, dissection, vasculitis or significant stenosis. Celiac: Patent without evidence of aneurysm, dissection, vasculitis or significant stenosis. SMA: Patent without evidence of aneurysm, dissection, vasculitis or significant stenosis. Renals: Both renal arteries are patent without evidence of aneurysm, dissection, vasculitis, fibromuscular dysplasia or significant stenosis. IMA: Patent  without evidence of aneurysm, dissection, vasculitis or significant stenosis. Inflow: Patent without evidence of aneurysm, dissection, vasculitis or significant stenosis. Veins: No obvious venous abnormality within the limitations of this arterial phase study.  Review of the MIP images confirms the above findings. NON-VASCULAR Hepatobiliary: The liver is normal. Cholelithiasis without acute inflammation.There is no biliary ductal dilation. Pancreas: Normal contours without ductal dilatation. No peripancreatic fluid collection. Spleen: Unremarkable. Adrenals/Urinary Tract: --Adrenal glands: Unremarkable. --Right kidney/ureter: No hydronephrosis or radiopaque kidney stones. --Left kidney/ureter: Peripelvic cysts are noted on the left. --Urinary bladder: Unremarkable. Stomach/Bowel: --Stomach/Duodenum: No hiatal hernia or other gastric abnormality. Normal duodenal course and caliber. --Small bowel: Unremarkable. --Colon: Unremarkable. --Appendix: Normal. Lymphatic: --No retroperitoneal lymphadenopathy. --No mesenteric lymphadenopathy. --No pelvic or inguinal lymphadenopathy. Reproductive: Status post hysterectomy. No adnexal mass. Other: No ascites or free air. There is a small fat containing umbilical hernia. Musculoskeletal. No acute displaced fractures. Review of the MIP images confirms the above findings. IMPRESSION: 1. No acute thoracic, abdominal or pelvic pathology. Specifically, there is no evidence for aortic aneurysm or dissection. 2. Cholelithiasis without acute inflammation. 3. Small fat containing umbilical hernia. Electronically Signed   By: Constance Holster M.D.   On: 06/02/2020 21:32    Procedures Procedures  Medications Ordered in the ED Medications  iohexol (OMNIPAQUE) 350 MG/ML injection 100 mL (100 mLs Intravenous Contrast Given 06/02/20 2115)  ketorolac (TORADOL) 30 MG/ML injection 30 mg (30 mg Intravenous Given 06/02/20 2223)     MDM Rules/Calculators/A&P MDM Patient with  mid-thoracic back and chest pain concurrent with syncope, most likely due to falling in the shower, but will also check CTA to rule out dissection as the cause of her fall. Labs and EKG done in triage are unremarkable.  ED Course  I have reviewed the triage vital signs and the nursing notes.  Pertinent labs & imaging results that were available during my care of the patient were reviewed by me and considered in my medical decision making (see chart for details).  Clinical Course as of Jun 03 2355  Mon Jun 02, 2020  2102 Leukocytosis likely from steroid use. She is not having any infectious symptoms.    [CS]    Clinical Course User Index [CS] Truddie Hidden, MD    Final Clinical Impression(s) / ED Diagnoses Final diagnoses:  Syncope, unspecified syncope type  Acute midline thoracic back pain    Rx / DC Orders ED Discharge Orders    None       Truddie Hidden, MD 06/02/20 2356

## 2020-06-02 NOTE — Telephone Encounter (Signed)
I spoke with pt and scheduler at front desk did not know pt had passed out and I spoke with pt and cancelled appt for today at [facility] and pt will get someone to take her to [facility] ED. Pt declined 911 but said she would not drive and would have someone take her to ED. Pt is not sure if she hit her head when passed out in bathroom; pt had no warning she was going to pass out. Pt thinks she may have twisted her rt ankle and has pain between shoulder blades. Pt also has crick in neck; pt is nauseated and the dizziness is not bad now but pt has been resting in bed and has not been up moving around. Pt said has had dizziness for 3 weeks but seems to be worsening. FYI to Dr Darnell Level.

## 2020-06-02 NOTE — ED Notes (Signed)
Patient verbalizes understanding of discharge instructions. Opportunity for questioning and answers were provided. Armband removed by staff, pt discharged from ED ambulatory.   

## 2020-06-02 NOTE — Telephone Encounter (Signed)
[facility] - Client TELEPHONE ADVICE RECORD [facility] Patient Name: Yolanda Walsh Gender: Female DOB: 06/12/1951 Age: 69 Y 70 M 2 D Return Phone Number: [phone] (Primary), [phone] (Secondary) Address[place] [address] Client [facility] - Client Client Site [place] [facility] Physician Ria Bush - MD Contact Type Call Who Is Calling Patient / Member / Family / Caregiver Call Type Triage / Clinical Relationship To Patient Self Return Phone Number [phone] (Primary) Chief Complaint FAINTING or [place] Reason for Call Symptomatic / Request for [name] states she fell with dizziness and passed out. She injured her back and might have vertigo. Translation No Nurse Assessment Nurse: Yolanda Resides, RN, Yolanda Walsh Date/Time Yolanda Walsh Time): 06/02/2020 8:43:35 AM Confirm and document reason for call. If symptomatic, describe symptoms. ---Caller states she got dizzy and passed out. Fell in the bathroom and has hurt her back as well. Hurts between her shoulder blades. Was unconscious for a few seconds- doesn't remember the fall. Dizzy spells started in March. Has seen ENT and neurology. Hasn't had a spell since summer. On 10/27 had another spell and had worsening symptoms. ENT thinks might be Meniere'shas been on Prednisone. Pt. has some nausea too. Fainting spell happened yesterday. Chest and back hurt as well due to the fall. Does the patient have any new or worsening symptoms? ---Yes Will a triage be completed? ---Yes Related visit to physician within the last 2 weeks? ---Yes Does the PT have any chronic conditions? (i.e. diabetes, asthma, this includes High risk factors for pregnancy, etc.) ---Yes List chronic conditions. ---Vertigo, Is this a behavioral health or substance abuse call? ---No Guidelines Guideline Title Affirmed Question  Affirmed Notes Nurse Date/Time (Eastern Time) Fainting [1] Age > 50 years AND [2] now alert and feels fine Yolanda Resides, RN, Yolanda Walsh 06/02/2020 8:48:42 AM Disp. Time Yolanda Walsh Time) Disposition Final User 06/02/2020 8:41:15 AM Send to Urgent Oris Drone 06/02/2020 8:55:02 AM Go to ED Now (or PCP triage) Yes Yolanda Resides, RN, Yolanda Walsh PLEASE NOTE: All timestamps contained within this report are represented as [place] Standard Time. CONFIDENTIALTY NOTICE: This fax transmission is intended only for the addressee. It contains information that is legally privileged, confidential or otherwise protected from use or disclosure. If you are not the intended recipient, you are strictly prohibited from reviewing, disclosing, copying using or disseminating any of this information or taking any action in reliance on or regarding this information. If you have received this fax in error, please notify us immediately by telephone so that we can arrange for its return to [place]. Phone: [phone], Toll-Free: [phone], Fax: [phone] Page: 2 of 2 Call Id: [id number] [place] Disagree/Comply Disagree Caller Understands Yes PreDisposition Call Doctor Care Advice Given Per Guideline GO TO ED NOW (OR PCP TRIAGE): ANOTHER ADULT SHOULD DRIVE: * It is better and safer if another adult drives instead of you. CALL BACK IF: * You become worse CARE ADVICE given per Fainting (Adult) guideline. Comments User: Yolanda Priest, RN Date/Time Yolanda Walsh Time): 06/02/2020 8:55:01 AM Pt. states she has an appt. at 3:20 today and wants to keep that. Refuses ER unless MD tells her to go after her appt. Referrals GO TO FACILITY REFUSED

## 2020-06-02 NOTE — ED Triage Notes (Addendum)
Pt reports ongoing dizziness for the past few weeks, has been on prednisone taper. States she got into the shower yesterday morning and the next thing she remembers is waking up on the shower floor. Now having pain between her shoulder blades and into her chest. Speech clear, a/ox4, face symmetrical, no neuro defs.

## 2020-06-13 ENCOUNTER — Encounter: Payer: Self-pay | Admitting: Family Medicine

## 2020-06-13 ENCOUNTER — Other Ambulatory Visit: Payer: Self-pay

## 2020-06-13 ENCOUNTER — Ambulatory Visit (INDEPENDENT_AMBULATORY_CARE_PROVIDER_SITE_OTHER): Payer: PPO | Admitting: Family Medicine

## 2020-06-13 VITALS — BP 124/70 | HR 68 | Temp 97.6°F | Ht 64.0 in | Wt 184.3 lb

## 2020-06-13 DIAGNOSIS — H9192 Unspecified hearing loss, left ear: Secondary | ICD-10-CM | POA: Diagnosis not present

## 2020-06-13 DIAGNOSIS — R55 Syncope and collapse: Secondary | ICD-10-CM | POA: Diagnosis not present

## 2020-06-13 DIAGNOSIS — H9312 Tinnitus, left ear: Secondary | ICD-10-CM

## 2020-06-13 DIAGNOSIS — H8102 Meniere's disease, left ear: Secondary | ICD-10-CM

## 2020-06-13 MED ORDER — HYDROCHLOROTHIAZIDE 12.5 MG PO TABS: 6.2500 mg | ORAL_TABLET | Freq: Every day | ORAL | 0 refills | Status: DC

## 2020-06-13 NOTE — Patient Instructions (Addendum)
Orthostatic vital signs today.  Try topical voltaren anti inflammatory to chest wall  Symptoms sound like Meniere's disease - keep ENT follow up later this month. May try hydrochlorothiazide 6.25mg  once daily. Let us know how you're doing with this. Monitor blood pressures.  Meniere Disease  Meniere disease is an inner ear disorder. It causes attacks of a spinning sensation (vertigo), dizziness, and ringing in the ear (tinnitus). It also causes hearing loss and a feeling of fullness or pressure in the ear. This is a lifelong condition, and it may get worse over time. You may have drop attacks or severe dizziness that makes you fall. A drop attack is when you suddenly fall without losing consciousness and you quickly recover after a few seconds or minutes. What are the causes? This condition is caused by having too much of the fluid that is in your inner ear (endolymph). When fluid builds up in your inner ear, it affects the nerves that control balance and hearing. The reason for the fluid buildup is not known. Possible causes include:  Allergies.  An abnormal reaction of the body's defense system (autoimmune disease).  Viral infection of the inner ear.  Head injury. What increases the risk? You are more likely to develop this condition if:  You are older than age 44.  You have a family history of Meniere disease.  You have a history of autoimmune disease.  You have a history of migraine headaches. What are the signs or symptoms? Symptoms of this condition can come and go and may last for up to 4 hours at a time. Symptoms usually start in one ear. They may become more frequent and eventually involve both ears. Symptoms can include:  Fullness and pressure in your ear.  Roaring or ringing in your ear.  Vertigo and loss of balance.  Dizziness.  Decreased hearing.  Nausea and vomiting. How is this diagnosed? This condition is diagnosed based on:  A physical exam.  Tests ,  such as: ? A hearing test (audiogram). ? An electronystagmogram. This tests your balance nerve (vestibular nerve). ? Imaging studies of your inner ear, such as CT scan or MRI. ? Other balance tests, such as rotational or balance platform tests. How is this treated? There is no cure for this condition, but treatment can help to manage your symptoms. Treatment may include:  A low-salt diet. Limiting salt may help to reduce fluid in the body and relieve symptoms.  Oral or injected medicines to reduce or control: ? Vertigo. ? Nausea. ? Fluid retention. ? Dizziness.  Use of an air pressure pulse generator. This is a machine that sends small pressure pulses into your ear canal.  Hearing aids.  Inner ear surgery. This is rare. When you have symptoms, it can be helpful to lie down on a flat surface and focus your eyes on one object that does not move. Try to stay in that position until your symptoms go away. Follow these instructions at home: Eating and drinking  Eat the same amount of food at the same time every day, including snacks.  Do not skip meals.  Avoid caffeine.  Drink enough fluids to keep your urine clear or pale yellow.  Limit alcoholic drinks to one drink a day for non-pregnant women and 2 drinks a day for men. One drink equals 12 oz of beer, 5 oz of wine, or 1 oz of hard liquor.  Limit the salt (sodium) in your diet as told by your health care provider.  Check ingredients and nutrition facts on packaged foods and beverages.  Do not eat foods that contain monosodium glutamate (MSG). General instructions  Do not use any products that contain nicotine or tobacco, such as cigarettes and e-cigarettes. If you need help quitting, ask your health care provider.  Take over-the-counter and prescription medicines only as told by your health care provider.  Find ways to reduce or avoid stress. If you need help with this, ask your health care provider.  Do not drive if you  have vertigo or dizziness. Contact a health care provider if:  You have symptoms that last longer than 4 hours.  You have new or worse symptoms. Get help right away if:  You have been vomiting for 24 hours.  You cannot keep fluids down.  You have chest pain or trouble breathing. Summary  Meniere disease is an inner ear disorder. It causes attacks of a spinning sensation (vertigo), dizziness, and ringing in the ear (tinnitus). It also causes hearing loss and a feeling of fullness or pressure in the ear.  Symptoms of this condition can come and go and may last for up to 4 hours at a time.  When you have symptoms, it can be helpful to lie down on a flat surface and focus your eyes on one object that does not move. Try to stay in that position until your symptoms go away. This information is not intended to replace advice given to you by your health care provider. Make sure you discuss any questions you have with your health care provider. Document Revised: 06/10/2017 Document Reviewed: 05/19/2016 Elsevier Patient Education  2020 [employer].

## 2020-06-13 NOTE — Progress Notes (Signed)
Patient ID: Yolanda Walsh, female    DOB: 1950-12-24, 69 y.o.   MRN: [id number]  This visit was conducted in person.  BP 124/70 (BP Location: Left Arm, Patient Position: Sitting, Cuff Size: Normal)   Pulse 68   Temp 97.6 F (36.4 C) (Temporal)   Ht 5\' 4"  (1.626 m)   Wt 184 lb 5 oz (83.6 kg)   SpO2 98%   BMI 31.64 kg/m   Orthostatic VS for the past 24 hrs (Last 3 readings):  BP- Lying BP- Standing at 0 minutes  06/13/20 1548 - 110/66  06/13/20 1546 118/70 -   BP Readings from Last 3 Encounters:  06/13/20 124/70  06/02/20 128/78  10/29/19 112/68    CC: ER f/u visit  Subjective:   HPI: Yolanda Walsh is a 69 y.o. female presenting on 06/13/2020 for Hospitalization Follow-up (Seen on 06/02/20 at [facility] ED due to syncope. )   Seen at ER 2 wks ago after syncopal episode in setting of recurrent vertigo. Had just been started on dyazide and prednisone taper by ENT (05/23/2020) due to suspicion for Meniere's disease. Records reviewed. No prodromal symptoms. Completed unrevealing CT head and CTA chest/abd/pelvis due to ongoing chest into back pain - also unrevealing (gallstones). Ongoing chest and back soreness at area where she landed on shower head.   Had to postpone trip to visit daughter in [place] due to vertigo.   Known L ear hearing loss and L tinnitus.  Notes ongoing facial sinus pressure.  She attributes syncope to diazide (orthostatic). She has stopped diazide.   She feels symptoms started after 2nd covid vaccine.  She got covid booster 04/08/2020 - symptoms recurred shortly after this.  F/u ENT visit scheduled 07/01/2020.  She has started using shower bench.   Has had neurological and ENT evaluation for vertigo, initially unrevealing including MRI and EEG.      Relevant past medical, surgical, family and social history reviewed and updated as indicated. Interim medical history since our last visit reviewed. Allergies and medications reviewed and updated. Outpatient  Medications Prior to Visit  Medication Sig Dispense Refill  . cetirizine (ZYRTEC) 10 MG tablet Take 10 mg by mouth daily.    . Cholecalciferol (VITAMIN D3) 1000 UNITS CAPS Take 1 capsule (1,000 Units total) by mouth daily. 30 capsule   . clobetasol cream (TEMOVATE) 0.05 % SMARTSIG:1 Topical Every Night    . ibuprofen (ADVIL,MOTRIN) 200 MG tablet Take 200 mg by mouth every 6 (six) hours as needed for mild pain or moderate pain.     . meclizine (ANTIVERT) 25 MG tablet Take 1 tablet (25 mg total) by mouth 3 (three) times daily as needed for dizziness. 30 tablet 0  . Multiple Vitamins-Minerals (PRESERVISION AREDS 2+MULTI VIT PO) Take 2 capsules by mouth daily.    . ondansetron (ZOFRAN ODT) 4 MG disintegrating tablet Take 1 tablet (4 mg total) by mouth every 8 (eight) hours as needed. 20 tablet 6  . rosuvastatin (CRESTOR) 5 MG tablet Take 1 tablet (5 mg total) by mouth every Monday, Wednesday, and Friday. 40 tablet 3  . sertraline (ZOLOFT) 50 MG tablet Take 1 tablet (50 mg total) by mouth daily. 90 tablet 3  . diazepam (VALIUM) 5 MG tablet Take 1 tablet (5 mg total) by mouth every 6 (six) hours as needed (dizziness). 30 tablet 0   No facility-administered medications prior to visit.     Per HPI unless specifically indicated in ROS section below Review of Systems Objective:  BP 124/70 (BP Location: Left Arm, Patient Position: Sitting, Cuff Size: Normal)   Pulse 68   Temp 97.6 F (36.4 C) (Temporal)   Ht 5\' 4"  (1.626 m)   Wt 184 lb 5 oz (83.6 kg)   SpO2 98%   BMI 31.64 kg/m   Wt Readings from Last 3 Encounters:  06/13/20 184 lb 5 oz (83.6 kg)  06/02/20 179 lb (81.2 kg)  10/29/19 186 lb (84.4 kg)      Physical Exam Vitals and nursing note reviewed.  Constitutional:      Appearance: Normal appearance. She is not ill-appearing.  HENT:     Right Ear: Tympanic membrane, ear canal and external ear normal. There is no impacted cerumen.     Left Ear: Tympanic membrane, ear canal and external  ear normal. There is no impacted cerumen.  Cardiovascular:     Rate and Rhythm: Normal rate and regular rhythm.     Pulses: Normal pulses.     Heart sounds: Normal heart sounds. No murmur heard.   Pulmonary:     Effort: Pulmonary effort is normal. No respiratory distress.     Breath sounds: Normal breath sounds. No wheezing, rhonchi or rales.     Comments:  No reproducible thoracic back pain  Reproducible sternal pain midline and L around 4th costochondral junction without swelling  Chest:     Chest wall: Tenderness present.    Musculoskeletal:     Right lower leg: No edema.     Left lower leg: No edema.  Skin:    General: Skin is warm and dry.     Findings: No rash.  Neurological:     General: No focal deficit present.     Mental Status: She is alert.     Comments:  CN 2-12 intact FTN intact EOMI  Psychiatric:        Mood and Affect: Mood normal.        Behavior: Behavior normal.       Results for orders placed or performed during the hospital encounter of 51/02/58  Basic metabolic panel  Result Value Ref Range   Sodium 134 (L) 135 - 145 mmol/L   Potassium 4.2 3.5 - 5.1 mmol/L   Chloride 100 98 - 111 mmol/L   CO2 18 (L) 22 - 32 mmol/L   Glucose, Bld 158 (H) 70 - 99 mg/dL   BUN 34 (H) 8 - 23 mg/dL   Creatinine, Ser 1.06 (H) 0.44 - 1.00 mg/dL   Calcium 10.1 8.9 - 10.3 mg/dL   GFR, Estimated 57 (L) >60 mL/min   Anion gap 16 (H) 5 - 15  CBC  Result Value Ref Range   WBC 15.5 (H) 4.0 - 10.5 K/uL   RBC 4.84 3.87 - 5.11 MIL/uL   Hemoglobin 13.5 12.0 - 15.0 g/dL   HCT 41.9 36 - 46 %   MCV 86.6 80.0 - 100.0 fL   MCH 27.9 26.0 - 34.0 pg   MCHC 32.2 30.0 - 36.0 g/dL   RDW 13.1 11.5 - 15.5 %   Platelets 396 150 - 400 K/uL   nRBC 0.0 0.0 - 0.2 %  Urinalysis, Routine w reflex microscopic  Result Value Ref Range   Color, Urine YELLOW YELLOW   APPearance HAZY (A) CLEAR   Specific Gravity, Urine 1.018 1.005 - 1.030   pH 5.0 5.0 - 8.0   Glucose, UA NEGATIVE NEGATIVE  mg/dL   Hgb urine dipstick NEGATIVE NEGATIVE   Bilirubin Urine NEGATIVE NEGATIVE  Ketones, ur NEGATIVE NEGATIVE mg/dL   Protein, ur NEGATIVE NEGATIVE mg/dL   Nitrite NEGATIVE NEGATIVE   Leukocytes,Ua NEGATIVE NEGATIVE  CBG monitoring, ED  Result Value Ref Range   Glucose-Capillary 106 (H) 70 - 99 mg/dL   Assessment & Plan:  This visit occurred during the SARS-CoV-2 public health emergency.  Safety protocols were in place, including screening questions prior to the visit, additional usage of staff PPE, and extensive cleaning of exam room while observing appropriate contact time as indicated for disinfecting solutions.   Problem List Items Addressed This Visit    Tinnitus   Syncope    Possibly related to recent maxzide use.  ER eval reviewed including CTA chest/abd/pelvis obtained for chest and back pain after fall onto shower head.  Orthostatic vital signs today normal off maxzide - will continue to monitor.       Meniere disease, left - Primary    Recent presumed diagnosis reviewed with patient - due to episodes of vertigo, L hearing loss and chronic L tinnitus. Recent maxzide may have contributed to syncope. She self stopped this - will Rx low dose hctz 6.25mg  to ensure tolerated. Encouraged keep ENT f/u.      Hearing loss of left ear       Meds ordered this encounter  Medications  . hydrochlorothiazide (HYDRODIURIL) 12.5 MG tablet    Sig: Take 0.5 tablets (6.25 mg total) by mouth daily.    Dispense:  15 tablet    Refill:  0   No orders of the defined types were placed in this encounter.   Patient instructions: Orthostatic vital signs today.  Try topical voltaren anti inflammatory to chest wall  Symptoms sound like Meniere's disease - keep ENT follow up later this month. May try hydrochlorothiazide 6.25mg  once daily. Let us know how you're doing with this. Monitor blood pressures.  Follow up plan: Return if symptoms worsen or fail to improve.  Ria Bush, MD

## 2020-06-14 NOTE — Assessment & Plan Note (Signed)
Possibly related to recent maxzide use.  ER eval reviewed including CTA chest/abd/pelvis obtained for chest and back pain after fall onto shower head.  Orthostatic vital signs today normal off maxzide - will continue to monitor.

## 2020-06-14 NOTE — Assessment & Plan Note (Signed)
Recent presumed diagnosis reviewed with patient - due to episodes of vertigo, L hearing loss and chronic L tinnitus. Recent maxzide may have contributed to syncope. She self stopped this - will Rx low dose hctz 6.25mg  to ensure tolerated. Encouraged keep ENT f/u.

## 2020-06-17 ENCOUNTER — Other Ambulatory Visit: Payer: Self-pay | Admitting: Family Medicine

## 2020-06-17 DIAGNOSIS — E559 Vitamin D deficiency, unspecified: Secondary | ICD-10-CM

## 2020-06-17 DIAGNOSIS — H8102 Meniere's disease, left ear: Secondary | ICD-10-CM

## 2020-06-17 DIAGNOSIS — E785 Hyperlipidemia, unspecified: Secondary | ICD-10-CM

## 2020-06-18 ENCOUNTER — Other Ambulatory Visit: Payer: Self-pay

## 2020-06-18 ENCOUNTER — Ambulatory Visit (INDEPENDENT_AMBULATORY_CARE_PROVIDER_SITE_OTHER): Payer: PPO

## 2020-06-18 DIAGNOSIS — Z0000 Encounter for general adult medical examination without abnormal findings: Secondary | ICD-10-CM

## 2020-06-18 NOTE — Progress Notes (Signed)
Subjective:   Yolanda Walsh is a 69 y.o. female who presents for Medicare Annual (Subsequent) preventive examination.  Review of Systems: N/A      I connected with the patient today by telephone and verified that I am speaking with the correct person using two identifiers. Location patient: home Location nurse: work Persons participating in the telephone visit: patient, nurse.   I discussed the limitations, risks, security and privacy concerns of performing an evaluation and management service by telephone and the availability of in person appointments. I also discussed with the patient that there may be a patient responsible charge related to this service. The patient expressed understanding and verbally consented to this telephonic visit.        Cardiac Risk Factors include: advanced age (>31men, >38 women);Other (see comment), Risk factor comments: hyperlipidemia     Objective:    Today's Vitals   06/18/20 0901  PainSc: 0-No pain   There is no height or weight on file to calculate BMI.  Advanced Directives 06/18/2020 06/02/2020 08/01/2019 07/04/2019 06/13/2019 06/02/2018 06/01/2017  Does Patient Have a Medical Advance Directive? Yes No Yes Yes Yes Yes Yes  Type of Paramedic of [place];Living will - [place];Living will Living will;Healthcare Power of [place];Living will [place];Living will [place];Living will  Does patient want to make changes to medical advance directive? - - No - Patient declined - - - -  Copy of [place] in Chart? No - copy requested - No - copy requested Yes - validated most recent copy scanned in chart (See row information) No - copy requested No - copy requested No - copy requested  Would patient like information on creating a medical advance directive? - No - Patient declined - - - - -    Current Medications  (verified) Outpatient Encounter Medications as of 06/18/2020  Medication Sig  . cetirizine (ZYRTEC) 10 MG tablet Take 10 mg by mouth daily.  . Cholecalciferol (VITAMIN D3) 1000 UNITS CAPS Take 1 capsule (1,000 Units total) by mouth daily.  . clobetasol cream (TEMOVATE) 0.05 % SMARTSIG:1 Topical Every Night  . hydrochlorothiazide (HYDRODIURIL) 12.5 MG tablet Take 0.5 tablets (6.25 mg total) by mouth daily.  Marland Kitchen ibuprofen (ADVIL,MOTRIN) 200 MG tablet Take 200 mg by mouth every 6 (six) hours as needed for mild pain or moderate pain.   . meclizine (ANTIVERT) 25 MG tablet Take 1 tablet (25 mg total) by mouth 3 (three) times daily as needed for dizziness.  . Multiple Vitamins-Minerals (PRESERVISION AREDS 2+MULTI VIT PO) Take 2 capsules by mouth daily.  . ondansetron (ZOFRAN ODT) 4 MG disintegrating tablet Take 1 tablet (4 mg total) by mouth every 8 (eight) hours as needed.  . rosuvastatin (CRESTOR) 5 MG tablet Take 1 tablet (5 mg total) by mouth every Monday, Wednesday, and Friday.  . sertraline (ZOLOFT) 50 MG tablet Take 1 tablet (50 mg total) by mouth daily.  . [DISCONTINUED] diazepam (VALIUM) 5 MG tablet Take 1 tablet (5 mg total) by mouth every 6 (six) hours as needed (dizziness).   No facility-administered encounter medications on file as of 06/18/2020.    Allergies (verified) Patient has no known allergies.   History: Past Medical History:  Diagnosis Date  . Ankle fracture    right  . Depression   . History of anal fissures 2013   worsened by constipation   . History of hemorrhoids  w/o complications  . History of rectal fissure   . Hyperlipidemia 04/24/2012  . Lichen sclerosus    temovate cream  . Medial meniscus tear   . Osteopenia    dexa 2013, 2016  . Trigger finger   . Vertigo    Past Surgical History:  Procedure Laterality Date  . CATARACT EXTRACTION W/PHACO Left 07/04/2019   Procedure: CATARACT EXTRACTION PHACO AND INTRAOCULAR LENS PLACEMENT (IOC) LEFT  6.67  00:44.0   15.2%;  Surgeon: Leandrew Koyanagi, MD;  Location: [facility];  Service: Ophthalmology;  Laterality: Left;  . CATARACT EXTRACTION W/PHACO Right 08/01/2019   Procedure: CATARACT EXTRACTION PHACO AND INTRAOCULAR LENS PLACEMENT (IOC) RIGHT 5.33  00:45.4  11.8%;  Surgeon: Leandrew Koyanagi, MD;  Location: [facility];  Service: Ophthalmology;  Laterality: Right;  . COLONOSCOPY  09/2003  . COLONOSCOPY  07/2016   TA, diverticulosis, rpt 5 yrs Henrene Pastor)  . dexa  04/2012   T -2.1 hip, -1.2 spine  . dexa  03/2015   T -2.4 hip, -1.7 spine  . I & D EXTREMITY Right 03/16/2014   cat bite hand s/p I&D in OR ([name])  . left hand surgery  1962   tendon laceration  . PARTIAL HYSTERECTOMY  1980   for prolapse, ovaries remain   Family History  Problem Relation Age of Onset  . Cancer Mother        leukemia  . Coronary artery disease Mother 69  . Coronary artery disease Father 33       MI  . Parkinsonism Brother 44       deceased  . Stroke Brother   . CAD Brother 20       7 stents  . Hypertension Brother   . Hypertension Sister   . Crohn's disease Sister   . Diabetes Maternal Grandfather   . CAD Other 54       stent   Social History   Socioeconomic History  . Marital status: Widowed    Spouse name: Not on file  . Number of children: 2  . Years of education: 93  . Highest education level: High school graduate  Occupational History  . Occupation: Retired  Tobacco Use  . Smoking status: Never Smoker  . Smokeless tobacco: Never Used  Vaping Use  . Vaping Use: Never used  Substance and Sexual Activity  . Alcohol use: Not Currently  . Drug use: No  . Sexual activity: Not on file  Other Topics Concern  . Not on file  Social History Narrative   Lives alone with cat.   Widow, husband deceased from [place]. Brother died from PD.    Daughter lives in [place]. Mother of Antony Contras.    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   No daily use of caffeine.   Social Determinants of Health   Financial Resource Strain: Low Risk   . Difficulty of Paying Living Expenses: Not hard at all  Food Insecurity: No Food Insecurity  . Worried About Charity fundraiser in the Last Year: Never true  . Ran Out of Food in the Last Year: Never true  Transportation Needs: No Transportation Needs  . Lack of Transportation (Medical): No  . Lack of Transportation (Non-Medical): No  Physical Activity: Insufficiently Active  . Days of Exercise per Week:  3 days  . Minutes of Exercise per Session: 30 min  Stress: No Stress Concern Present  . Feeling of Stress : Not at all  Social Connections:   . Frequency of Communication with Friends and Family: Not on file  . Frequency of Social Gatherings with Friends and Family: Not on file  . Attends Religious Services: Not on file  . Active Member of Clubs or Organizations: Not on file  . Attends Archivist Meetings: Not on file  . Marital Status: Not on file    Tobacco Counseling Counseling given: Not Answered   Clinical Intake:  Pre-visit preparation completed: Yes  Pain : No/denies pain Pain Score: 0-No pain     Nutritional Risks: None Diabetes: No  How often do you need to have someone help you when you read instructions, pamphlets, or other written materials from your doctor or pharmacy?: 1 - Never What is the last grade level you completed in school?: 12th  Diabetic: No Nutrition Risk Assessment:  Has the patient had any N/V/D within the last 2 months?  No  Does the patient have any non-healing wounds?  No  Has the patient had any unintentional weight loss or weight gain?  No   Diabetes:  Is the patient diabetic?  No  If diabetic, was a CBG obtained today?  N/A Did the patient bring in their glucometer from home?  N/A How  often do you monitor your CBG's? N/A.   Financial Strains and Diabetes Management:  Are you having any financial strains with the device, your supplies or your medication? N/A.  Does the patient want to be seen by Chronic Care Management for management of their diabetes?  N/A Would the patient like to be referred to a Nutritionist or for Diabetic Management?  N/A   Interpreter Needed?: No  Information entered by :: CJohnson, LPN   Activities of Daily Living In your present state of health, do you have any difficulty performing the following activities: 06/18/2020 08/01/2019  Hearing? Y N  Comment left ear hearing loss -  Vision? N N  Difficulty concentrating or making decisions? N N  Walking or climbing stairs? N N  Dressing or bathing? N N  Doing errands, shopping? N -  Preparing Food and eating ? N -  Using the Toilet? N -  In the past six months, have you accidently leaked urine? N -  Do you have problems with loss of bowel control? N -  Managing your Medications? N -  Managing your Finances? N -  Housekeeping or managing your Housekeeping? N -  Some recent data might be hidden    Patient Care Team: Ria Bush, MD as PCP - General (Family Medicine) Eulogio Bear, MD as Consulting Physician (Ophthalmology) Jodi Marble, MD as Consulting Physician (Otolaryngology)  Indicate any recent Medical Services you may have received from other than [facility] providers in the past year (date may be approximate).     Assessment:   This is a routine wellness examination for Faatimah.  Hearing/Vision screen  Hearing Screening   125Hz  250Hz  500Hz  1000Hz  2000Hz  3000Hz  4000Hz  6000Hz  8000Hz   Right ear:           Left ear:           Vision Screening Comments: Patient gets annual eye exams  Dietary issues and exercise activities discussed: Current Exercise Habits: Home exercise routine, Type of exercise: walking, Time (Minutes): 30, Frequency (Times/Week): 3, Weekly Exercise  (Minutes/Week): 90, Intensity: Moderate, Exercise  limited by: None identified  Goals    . Increase physical activity     Starting 06/02/2018 and weather permitting, I will continue to walk 1-2 miles daily.      . Patient Stated     06/13/2019, I will try to work on losing some weight.    . Patient Stated     06/18/2020, I will continue to walk 1-4 miles for 3 days a week.       Depression Screen PHQ 2/9 Scores 06/18/2020 06/13/2019 06/02/2018 06/01/2017 05/27/2016  PHQ - 2 Score 0 0 0 1 0  PHQ- 9 Score 0 0 0 1 -    Fall Risk Fall Risk  06/18/2020 06/13/2019 06/06/2019 06/02/2018 06/01/2017  Falls in the past year? 1 0 0 0 No  Comment fell in shower - Emmi Telephone Survey: data to providers prior to load - -  Number falls in past yr: 0 0 - - -  Injury with Fall? 0 0 - - -  Comment - - - - -  Risk for fall due to : Medication side effect - - - -  Follow up Falls evaluation completed;Falls prevention discussed Falls evaluation completed;Falls prevention discussed - - -    FALL RISK PREVENTION PERTAINING TO THE HOME:  Any stairs in or around the home? Yes  If so, are there any without handrails? No  Home free of loose throw rugs in walkways, pet beds, electrical cords, etc? Yes  Adequate lighting in your home to reduce risk of falls? Yes   ASSISTIVE DEVICES UTILIZED TO PREVENT FALLS:  Life alert? No  Use of a cane, walker or w/c? No  Grab bars in the bathroom? No  Shower chair or bench in shower? Yes  Elevated toilet seat or a handicapped toilet? No   TIMED UP AND GO:  Was the test performed? N/A, telephone visit.    Cognitive Function: MMSE - Mini Mental State Exam 06/18/2020 06/13/2019 06/02/2018 06/01/2017  Orientation to time 5 5 5 5   Orientation to Place 5 5 5 5   Registration 3 3 3 3   Attention/ Calculation 5 5 0 0  Recall 3 3 3 3   Language- name 2 objects - - 0 0  Language- repeat 1 1 1 1   Language- follow 3 step command - - 3 3  Language- read & follow  direction - - 0 0  Write a sentence - - 0 0  Copy design - - 0 0  Total score - - 20 20  Mini Cog  Mini-Cog screen was completed. Maximum score is 22. A value of 0 denotes this part of the MMSE was not completed or the patient failed this part of the Mini-Cog screening.       Immunizations Immunization History  Administered Date(s) Administered  . Influenza Split 04/18/2012  . Influenza Whole 04/13/2008  . Influenza, High Dose Seasonal PF 03/26/2020  . Influenza,inj,Quad PF,6+ Mos 04/19/2013, 04/01/2014, 03/28/2015, 03/31/2016, 04/08/2017, 04/06/2018, 03/01/2019  . PFIZER SARS-COV-2 Vaccination 08/04/2019, 08/25/2019, 04/08/2020  . Pneumococcal Conjugate-13 05/27/2016  . Pneumococcal Polysaccharide-23 06/01/2017  . Td 03/13/1999  . Tdap 02/04/2011  . Zoster 05/23/2012  . Zoster Recombinat (Shingrix) 09/12/2017, 11/14/2017    TDAP status: Up to date  Flu Vaccine status: Up to date  Pneumococcal vaccine status: Up to date  Covid-19 vaccine status: Completed vaccines  Qualifies for Shingles Vaccine? Yes   Zostavax completed Yes   Shingrix Completed?: Yes  Screening Tests Health Maintenance  Topic Date Due  .  TETANUS/TDAP  02/03/2021  . MAMMOGRAM  04/24/2021  . COLONOSCOPY  07/26/2021  . INFLUENZA VACCINE  Completed  . DEXA SCAN  Completed  . COVID-19 Vaccine  Completed  . Hepatitis C Screening  Completed  . PNA vac Low Risk Adult  Completed    Health Maintenance  There are no preventive care reminders to display for this patient.  Colorectal cancer screening: Type of screening: Colonoscopy. Completed 07/26/2016. Repeat every 5 years  Mammogram status: Completed 04/24/2020. Repeat every year  Bone Density status: Completed 05/14/2019. Results reflect: Bone density results: OSTEOPENIA. Repeat every 2 years.  Lung Cancer Screening: (Low Dose CT Chest recommended if Age 31-80 years, 30 pack-year currently smoking OR have quit w/in 15 years.) does not qualify.     Additional Screening:  Hepatitis C Screening: does qualify; Completed 05/14/2015  Vision Screening: Recommended annual ophthalmology exams for early detection of glaucoma and other disorders of the eye. Is the patient up to date with their annual eye exam?  Yes  Who is the provider or what is the name of the office in which the patient attends annual eye exams? Dr. Wallace Going  If pt is not established with a provider, would they like to be referred to a provider to establish care? No .   Dental Screening: Recommended annual dental exams for proper oral hygiene  Community Resource Referral / Chronic Care Management: CRR required this visit?  No   CCM required this visit?  No      Plan:     I have personally reviewed and noted the following in the patient's chart:   . Medical and social history . Use of alcohol, tobacco or illicit drugs  . Current medications and supplements . Functional ability and status . Nutritional status . Physical activity . Advanced directives . List of other physicians . Hospitalizations, surgeries, and ER visits in previous 12 months . Vitals . Screenings to include cognitive, depression, and falls . Referrals and appointments  In addition, I have reviewed and discussed with patient certain preventive protocols, quality metrics, and best practice recommendations. A written personalized care plan for preventive services as well as general preventive health recommendations were provided to patient.   Due to this being a telephonic visit, the after visit summary with patients personalized plan was offered to patient via office or my-chart. Patient preferred to pick up at office at next visit or via mychart.   Andrez Grime, LPN   [id number]

## 2020-06-18 NOTE — Patient Instructions (Signed)
[name] , Thank you for taking time to come for your Medicare Wellness Visit. I appreciate your ongoing commitment to your health goals. Please review the following plan we discussed and let me know if I can assist you in the future.   Screening recommendations/referrals: Colonoscopy: Up to date, completed 07/26/2016, due 07/2021 Mammogram: Up to date, completed 04/24/2020, due 04/2021 Bone Density: Up to date, completed 05/14/2019, due 05/2021 Recommended yearly ophthalmology/optometry visit for glaucoma screening and checkup Recommended yearly dental visit for hygiene and checkup  Vaccinations: Influenza vaccine: Up to date, completed 03/26/2020, due 02/2021 Pneumococcal vaccine: Completed series Tdap vaccine: Up to date, completed 02/04/2011, due 01/2021 Shingles vaccine: Completed series   Covid-19:Completed series  Advanced directives: Please bring a copy of your POA (Power of [place]) and/or Living Will to your next appointment.  Conditions/risks identified: hyperlipidemia  Next appointment: Follow up in one year for your annual wellness visit    Preventive Care 65 Years and Older, Female Preventive care refers to lifestyle choices and visits with your health care provider that can promote health and wellness. What does preventive care include?  A yearly physical exam. This is also called an annual well check.  Dental exams once or twice a year.  Routine eye exams. Ask your health care provider how often you should have your eyes checked.  Personal lifestyle choices, including:  Daily care of your teeth and gums.  Regular physical activity.  Eating a healthy diet.  Avoiding tobacco and drug use.  Limiting alcohol use.  Practicing safe sex.  Taking low-dose aspirin every day.  Taking vitamin and mineral supplements as recommended by your health care provider. What happens during an annual well check? The services and screenings done by your health care provider  during your annual well check will depend on your age, overall health, lifestyle risk factors, and family history of disease. Counseling  Your health care provider may ask you questions about your:  Alcohol use.  Tobacco use.  Drug use.  Emotional well-being.  Home and relationship well-being.  Sexual activity.  Eating habits.  History of falls.  Memory and ability to understand (cognition).  Work and work Statistician.  Reproductive health. Screening  You may have the following tests or measurements:  Height, weight, and BMI.  Blood pressure.  Lipid and cholesterol levels. These may be checked every 5 years, or more frequently if you are over 41 years old.  Skin check.  Lung cancer screening. You may have this screening every year starting at age 35 if you have a 30-pack-year history of smoking and currently smoke or have quit within the past 15 years.  Fecal occult blood test (FOBT) of the stool. You may have this test every year starting at age 47.  Flexible sigmoidoscopy or colonoscopy. You may have a sigmoidoscopy every 5 years or a colonoscopy every 10 years starting at age 48.  Hepatitis C blood test.  Hepatitis B blood test.  Sexually transmitted disease (STD) testing.  Diabetes screening. This is done by checking your blood sugar (glucose) after you have not eaten for a while (fasting). You may have this done every 1-3 years.  Bone density scan. This is done to screen for osteoporosis. You may have this done starting at age 36.  Mammogram. This may be done every 1-2 years. Talk to your health care provider about how often you should have regular mammograms. Talk with your health care provider about your test results, treatment options, and if necessary,  the need for more tests. Vaccines  Your health care provider may recommend certain vaccines, such as:  Influenza vaccine. This is recommended every year.  Tetanus, diphtheria, and acellular pertussis  (Tdap, Td) vaccine. You may need a Td booster every 10 years.  Zoster vaccine. You may need this after age 46.  Pneumococcal 13-valent conjugate (PCV13) vaccine. One dose is recommended after age 32.  Pneumococcal polysaccharide (PPSV23) vaccine. One dose is recommended after age 56. Talk to your health care provider about which screenings and vaccines you need and how often you need them. This information is not intended to replace advice given to you by your health care provider. Make sure you discuss any questions you have with your health care provider. Document Released: 07/25/2015 Document Revised: 03/17/2016 Document Reviewed: 04/29/2015 Elsevier Interactive Patient Education  2017 [employer] Prevention in the Home Falls can cause injuries. They can happen to people of all ages. There are many things you can do to make your home safe and to help prevent falls. What can I do on the outside of my home?  Regularly fix the edges of walkways and driveways and fix any cracks.  Remove anything that might make you trip as you walk through a door, such as a raised step or threshold.  Trim any bushes or trees on the path to your home.  Use bright outdoor lighting.  Clear any walking paths of anything that might make someone trip, such as rocks or tools.  Regularly check to see if handrails are loose or broken. Make sure that both sides of any steps have handrails.  Any raised decks and porches should have guardrails on the edges.  Have any leaves, snow, or ice cleared regularly.  Use sand or salt on walking paths during winter.  Clean up any spills in your garage right away. This includes oil or grease spills. What can I do in the bathroom?  Use night lights.  Install grab bars by the toilet and in the tub and shower. Do not use towel bars as grab bars.  Use non-skid mats or decals in the tub or shower.  If you need to sit down in the shower, use a plastic, non-slip  stool.  Keep the floor dry. Clean up any water that spills on the floor as soon as it happens.  Remove soap buildup in the tub or shower regularly.  Attach bath mats securely with double-sided non-slip rug tape.  Do not have throw rugs and other things on the floor that can make you trip. What can I do in the bedroom?  Use night lights.  Make sure that you have a light by your bed that is easy to reach.  Do not use any sheets or blankets that are too big for your bed. They should not hang down onto the floor.  Have a firm chair that has side arms. You can use this for support while you get dressed.  Do not have throw rugs and other things on the floor that can make you trip. What can I do in the kitchen?  Clean up any spills right away.  Avoid walking on wet floors.  Keep items that you use a lot in easy-to-reach places.  If you need to reach something above you, use a strong step stool that has a grab bar.  Keep electrical cords out of the way.  Do not use floor polish or wax that makes floors slippery. If you must use  wax, use non-skid floor wax.  Do not have throw rugs and other things on the floor that can make you trip. What can I do with my stairs?  Do not leave any items on the stairs.  Make sure that there are handrails on both sides of the stairs and use them. Fix handrails that are broken or loose. Make sure that handrails are as long as the stairways.  Check any carpeting to make sure that it is firmly attached to the stairs. Fix any carpet that is loose or worn.  Avoid having throw rugs at the top or bottom of the stairs. If you do have throw rugs, attach them to the floor with carpet tape.  Make sure that you have a light switch at the top of the stairs and the bottom of the stairs. If you do not have them, ask someone to add them for you. What else can I do to help prevent falls?  Wear shoes that:  Do not have high heels.  Have rubber bottoms.  Are  comfortable and fit you well.  Are closed at the toe. Do not wear sandals.  If you use a stepladder:  Make sure that it is fully opened. Do not climb a closed stepladder.  Make sure that both sides of the stepladder are locked into place.  Ask someone to hold it for you, if possible.  Clearly mark and make sure that you can see:  Any grab bars or handrails.  First and last steps.  Where the edge of each step is.  Use tools that help you move around (mobility aids) if they are needed. These include:  Canes.  Walkers.  Scooters.  Crutches.  Turn on the lights when you go into a dark area. Replace any light bulbs as soon as they burn out.  Set up your furniture so you have a clear path. Avoid moving your furniture around.  If any of your floors are uneven, fix them.  If there are any pets around you, be aware of where they are.  Review your medicines with your doctor. Some medicines can make you feel dizzy. This can increase your chance of falling. Ask your doctor what other things that you can do to help prevent falls. This information is not intended to replace advice given to you by your health care provider. Make sure you discuss any questions you have with your health care provider. Document Released: 04/24/2009 Document Revised: 12/04/2015 Document Reviewed: 08/02/2014 Elsevier Interactive Patient Education  2017 [employer].

## 2020-06-18 NOTE — Progress Notes (Signed)
PCP notes:  Health Maintenance: No gaps noted   Abnormal Screenings: none   Patient concerns: none   Nurse concerns: none   Next PCP appt: 06/23/2020 @ 10:30 am

## 2020-06-19 ENCOUNTER — Other Ambulatory Visit (INDEPENDENT_AMBULATORY_CARE_PROVIDER_SITE_OTHER): Payer: PPO

## 2020-06-19 ENCOUNTER — Other Ambulatory Visit: Payer: Self-pay

## 2020-06-19 DIAGNOSIS — E785 Hyperlipidemia, unspecified: Secondary | ICD-10-CM | POA: Diagnosis not present

## 2020-06-19 DIAGNOSIS — H8102 Meniere's disease, left ear: Secondary | ICD-10-CM

## 2020-06-19 DIAGNOSIS — E559 Vitamin D deficiency, unspecified: Secondary | ICD-10-CM | POA: Diagnosis not present

## 2020-06-19 LAB — LIPID PANEL
Cholesterol: 192 mg/dL (ref 0–200)
HDL: 50 mg/dL (ref >39.00)
LDL Cholesterol: 116 mg/dL — ABNORMAL HIGH (ref 0–99)
NonHDL: 141.6
Total CHOL/HDL Ratio: 4
Triglycerides: 129 mg/dL (ref 0.0–149.0)
VLDL: 25.8 mg/dL (ref 0.0–40.0)

## 2020-06-19 LAB — CBC WITH DIFFERENTIAL/PLATELET
Basophils Absolute: 0 10*3/uL (ref 0.0–0.1)
Basophils Relative: 0.5 % (ref 0.0–3.0)
Eosinophils Absolute: 0.1 10*3/uL (ref 0.0–0.7)
Eosinophils Relative: 1.5 % (ref 0.0–5.0)
HCT: 37.3 % (ref 36.0–46.0)
Hemoglobin: 12 g/dL (ref 12.0–15.0)
Lymphocytes Relative: 29 % (ref 12.0–46.0)
Lymphs Abs: 2 10*3/uL (ref 0.7–4.0)
MCHC: 32.2 g/dL (ref 30.0–36.0)
MCV: 86.7 fl (ref 78.0–100.0)
Monocytes Absolute: 0.6 10*3/uL (ref 0.1–1.0)
Monocytes Relative: 9.6 % (ref 3.0–12.0)
Neutro Abs: 4 10*3/uL (ref 1.4–7.7)
Neutrophils Relative %: 59.4 % (ref 43.0–77.0)
Platelets: 225 10*3/uL (ref 150.0–400.0)
RBC: 4.3 Mil/uL (ref 3.87–5.11)
RDW: 14.2 % (ref 11.5–15.5)
WBC: 6.7 10*3/uL (ref 4.0–10.5)

## 2020-06-19 LAB — COMPREHENSIVE METABOLIC PANEL
ALT: 13 U/L (ref 0–35)
AST: 15 U/L (ref 0–37)
Albumin: 4 g/dL (ref 3.5–5.2)
Alkaline Phosphatase: 65 U/L (ref 39–117)
BUN: 17 mg/dL (ref 6–23)
CO2: 30 mEq/L (ref 19–32)
Calcium: 9.3 mg/dL (ref 8.4–10.5)
Chloride: 102 mEq/L (ref 96–112)
Creatinine, Ser: 0.74 mg/dL (ref 0.40–1.20)
GFR: 82.43 mL/min (ref >60.00)
Glucose, Bld: 98 mg/dL (ref 70–99)
Potassium: 3.9 mEq/L (ref 3.5–5.1)
Sodium: 139 mEq/L (ref 135–145)
Total Bilirubin: 0.5 mg/dL (ref 0.2–1.2)
Total Protein: 6.9 g/dL (ref 6.0–8.3)

## 2020-06-19 LAB — TSH: TSH: 3.11 u[IU]/mL (ref 0.35–4.50)

## 2020-06-19 LAB — VITAMIN D 25 HYDROXY (VIT D DEFICIENCY, FRACTURES): VITD: 36.88 ng/mL (ref 30.00–100.00)

## 2020-06-23 ENCOUNTER — Ambulatory Visit (INDEPENDENT_AMBULATORY_CARE_PROVIDER_SITE_OTHER): Payer: PPO | Admitting: Family Medicine

## 2020-06-23 ENCOUNTER — Encounter: Payer: Self-pay | Admitting: Family Medicine

## 2020-06-23 ENCOUNTER — Other Ambulatory Visit: Payer: Self-pay

## 2020-06-23 VITALS — BP 116/66 | HR 78 | Temp 97.5°F | Ht 64.0 in | Wt 183.1 lb

## 2020-06-23 DIAGNOSIS — H259 Unspecified age-related cataract: Secondary | ICD-10-CM

## 2020-06-23 DIAGNOSIS — F411 Generalized anxiety disorder: Secondary | ICD-10-CM

## 2020-06-23 DIAGNOSIS — H3530 Unspecified macular degeneration: Secondary | ICD-10-CM

## 2020-06-23 DIAGNOSIS — H8102 Meniere's disease, left ear: Secondary | ICD-10-CM

## 2020-06-23 DIAGNOSIS — E669 Obesity, unspecified: Secondary | ICD-10-CM | POA: Diagnosis not present

## 2020-06-23 DIAGNOSIS — E559 Vitamin D deficiency, unspecified: Secondary | ICD-10-CM | POA: Diagnosis not present

## 2020-06-23 DIAGNOSIS — Z0000 Encounter for general adult medical examination without abnormal findings: Secondary | ICD-10-CM | POA: Diagnosis not present

## 2020-06-23 DIAGNOSIS — E785 Hyperlipidemia, unspecified: Secondary | ICD-10-CM | POA: Diagnosis not present

## 2020-06-23 DIAGNOSIS — M8580 Other specified disorders of bone density and structure, unspecified site: Secondary | ICD-10-CM | POA: Diagnosis not present

## 2020-06-23 MED ORDER — SERTRALINE HCL 50 MG PO TABS
75.0000 mg | ORAL_TABLET | Freq: Every day | ORAL | 3 refills | Status: DC
Start: 2020-06-23 — End: 2021-06-24

## 2020-06-23 MED ORDER — HYDROCHLOROTHIAZIDE 12.5 MG PO TABS
6.2500 mg | ORAL_TABLET | Freq: Every day | ORAL | 6 refills | Status: DC
Start: 2020-06-23 — End: 2021-01-19

## 2020-06-23 MED ORDER — ROSUVASTATIN CALCIUM 5 MG PO TABS
5.0000 mg | ORAL_TABLET | ORAL | 3 refills | Status: DC
Start: 2020-06-23 — End: 2021-06-24

## 2020-06-23 NOTE — Assessment & Plan Note (Signed)
Ongoing trouble, worse since sister's passing and recent syncopal episode. Discussed unresolved trauma in her life. Declines counseling referral at this time - states not ready yet. Will increase sertraline to 75mg  daily. Encouraged she go to pickle ball Monday classes and encouraged social engagement.

## 2020-06-23 NOTE — Assessment & Plan Note (Signed)
Encourage healthy diet and lifestyle choices.

## 2020-06-23 NOTE — Assessment & Plan Note (Signed)
Chronic stable on low dose crestor MWF. Continue current regimen. The 10-year ASCVD risk score Yolanda Walsh DC Yolanda Walsh., et al., 2013) is: 9.5%   Values used to calculate the score:     Age: 69 years     Sex: Female     Is Non-Hispanic African American: No     Diabetic: No     Tobacco smoker: No     Systolic Blood Pressure: 010 mmHg     Is BP treated: Yes     HDL Cholesterol: 50 mg/dL     Total Cholesterol: 192 mg/dL

## 2020-06-23 NOTE — Assessment & Plan Note (Signed)
Presumed. Tolerating hctz 6.25mg  daily. Discussed option to increase dose to 12.5mg . keep ENT f/u next week.

## 2020-06-23 NOTE — Assessment & Plan Note (Signed)
Preventative protocols reviewed and updated unless pt declined. Discussed healthy diet and lifestyle.  

## 2020-06-23 NOTE — Assessment & Plan Note (Signed)
Reviewed calcium and vit D intake and regular weight bearing exercise. She is not currently taking calcium supplement - will look into chewable calcium. Reviewed dietary calcium.

## 2020-06-23 NOTE — Assessment & Plan Note (Signed)
S/p surgery 

## 2020-06-23 NOTE — Assessment & Plan Note (Signed)
Appreciate ophtho care.  

## 2020-06-23 NOTE — Patient Instructions (Addendum)
Increase sertraline to 75mg  daily.  Good to see you today  Return as needed or in 1 year for next physical.    Health Maintenance After Age 69 After age 16, you are at a higher risk for certain long-term diseases and infections as well as injuries from falls. Falls are a major cause of broken bones and head injuries in people who are older than age 63. Getting regular preventive care can help to keep you healthy and well. Preventive care includes getting regular testing and making lifestyle changes as recommended by your health care provider. Talk with your health care provider about:  Which screenings and tests you should have. A screening is a test that checks for a disease when you have no symptoms.  A diet and exercise plan that is right for you. What should I know about screenings and tests to prevent falls? Screening and testing are the best ways to find a health problem early. Early diagnosis and treatment give you the best chance of managing medical conditions that are common after age 49. Certain conditions and lifestyle choices may make you more likely to have a fall. Your health care provider may recommend:  Regular vision checks. Poor vision and conditions such as cataracts can make you more likely to have a fall. If you wear glasses, make sure to get your prescription updated if your vision changes.  Medicine review. Work with your health care provider to regularly review all of the medicines you are taking, including over-the-counter medicines. Ask your health care provider about any side effects that may make you more likely to have a fall. Tell your health care provider if any medicines that you take make you feel dizzy or sleepy.  Osteoporosis screening. Osteoporosis is a condition that causes the bones to get weaker. This can make the bones weak and cause them to break more easily.  Blood pressure screening. Blood pressure changes and medicines to control blood pressure can make  you feel dizzy.  Strength and balance checks. Your health care provider may recommend certain tests to check your strength and balance while standing, walking, or changing positions.  Foot health exam. Foot pain and numbness, as well as not wearing proper footwear, can make you more likely to have a fall.  Depression screening. You may be more likely to have a fall if you have a fear of falling, feel emotionally low, or feel unable to do activities that you used to do.  Alcohol use screening. Using too much alcohol can affect your balance and may make you more likely to have a fall. What actions can I take to lower my risk of falls? General instructions  Talk with your health care provider about your risks for falling. Tell your health care provider if: ? You fall. Be sure to tell your health care provider about all falls, even ones that seem minor. ? You feel dizzy, sleepy, or off-balance.  Take over-the-counter and prescription medicines only as told by your health care provider. These include any supplements.  Eat a healthy diet and maintain a healthy weight. A healthy diet includes low-fat dairy products, low-fat (lean) meats, and fiber from whole grains, beans, and lots of fruits and vegetables. Home safety  Remove any tripping hazards, such as rugs, cords, and clutter.  Install safety equipment such as grab bars in bathrooms and safety rails on stairs.  Keep rooms and walkways well-lit. Activity   Follow a regular exercise program to stay fit. This will  help you maintain your balance. Ask your health care provider what types of exercise are appropriate for you.  If you need a cane or walker, use it as recommended by your health care provider.  Wear supportive shoes that have nonskid soles. Lifestyle  Do not drink alcohol if your health care provider tells you not to drink.  If you drink alcohol, limit how much you have: ? 0-1 drink a day for women. ? 0-2 drinks a day for  men.  Be aware of how much alcohol is in your drink. In the [place] one drink equals one typical bottle of beer (12 oz), one-half glass of wine (5 oz), or one shot of hard liquor (1 oz).  Do not use any products that contain nicotine or tobacco, such as cigarettes and e-cigarettes. If you need help quitting, ask your health care provider. Summary  Having a healthy lifestyle and getting preventive care can help to protect your health and wellness after age 20.  Screening and testing are the best way to find a health problem early and help you avoid having a fall. Early diagnosis and treatment give you the best chance for managing medical conditions that are more common for people who are older than age 69.  Falls are a major cause of broken bones and head injuries in people who are older than age 18. Take precautions to prevent a fall at home.  Work with your health care provider to learn what changes you can make to improve your health and wellness and to prevent falls. This information is not intended to replace advice given to you by your health care provider. Make sure you discuss any questions you have with your health care provider. Document Revised: 10/19/2018 Document Reviewed: 05/11/2017 Elsevier Patient Education  2020 [employer].

## 2020-06-23 NOTE — Assessment & Plan Note (Signed)
Continue vit D replacement.  

## 2020-06-23 NOTE — Progress Notes (Signed)
Patient ID: Yolanda Walsh, female    DOB: October 12, 1950, 69 y.o.   MRN: [id number]  This visit was conducted in person.  BP 116/66 (BP Location: Left Arm, Patient Position: Sitting, Cuff Size: Normal)   Pulse 78   Temp (!) 97.5 F (36.4 C) (Temporal)   Ht 5\' 4"  (1.626 m)   Wt 183 lb 1 oz (83 kg)   SpO2 98%   BMI 31.42 kg/m    CC: CPE Subjective:   HPI: Yolanda Walsh is a 69 y.o. female presenting on 06/23/2020 for Annual Exam (Prt 2. )   Saw health advisor last week for medicare wellness visit. Note reviewed.   No exam data present  Flowsheet Row Clinical Support from 06/18/2020 in [place] [facility] Total Score 0      Fall Risk  06/18/2020 06/13/2019 06/06/2019 06/02/2018 06/01/2017  Falls in the past year? 1 0 0 0 No  Comment fell in shower - Emmi Telephone Survey: data to providers prior to load - -  Number falls in past yr: 0 0 - - -  Injury with Fall? 0 0 - - -  Comment - - - - -  Risk for fall due to : Medication side effect - - - -  Follow up Falls evaluation completed;Falls prevention discussed Falls evaluation completed;Falls prevention discussed - - -  Fall due to syncope - see prior note for details.  New presumed Meniere's diagnosis seeing ENT for this. Trouble with full dose meclizine (?syncope) so we prescribed 1/2 tab hctz 12.5mg  daily.   Sister passed away 2020-01-14 - especially tough holiday season this year.  Longstanding sertraline use. More difficult year due to sister's passing as well as recent syncopal event.   Preventative: Colonoscopy1/2018 - TA, diverticulosis, rpt 5 yrs Henrene Pastor). Well woman with Dr. Marvel Plan h/o partal hysterectomy. Sees yearly (last seen 04/2020). Ovaries remain. Mammogram - 04/2020 [facility]1 [facility] DEXA10/2013,03/2015, 04/2017, 05/2019 T -2.4 hip, -1.8 spine -reviewed calicum and vit D intake, rec weight bearing exercise.Most calcium in diet.  Flu - yearly [place] 07/2019, 08/2019,  03/2020 Prevnar2017, pneumovax 2018 - localized reaction to this. Tdap 2012.  zostavax- 2013 Shingrix -completed 2 series 2019 Advanced directive - scanned in chart 06/2019. Daughter and son are HCPOA. Does not want prolonged life support. Ok with temporary measures.  Seat belt use discussed.  Sunscreen use discussed. No changing moles on skin.  Non smoker  Alcohol - none  Dentist - yearly Eye exam yearly on preservision for MD - s/p cataracts 06/2019 ([place])  Bowel - no constipation  Bladder - no incontinence  Lives with cat  Widow, husband deceased from [place]. Daughter lives in [place], son local [place]).  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 Interim medical history since our last visit reviewed. Allergies and medications reviewed and updated. Outpatient Medications Prior to Visit  Medication Sig Dispense Refill  . cetirizine (ZYRTEC) 10 MG tablet Take 10 mg by mouth daily.    . Cholecalciferol (VITAMIN D3) 1000 UNITS CAPS Take 1 capsule (1,000 Units total) by mouth daily. 30 capsule   . clobetasol cream (TEMOVATE) 0.05 % SMARTSIG:1 Topical Every Night    . ibuprofen (ADVIL,MOTRIN) 200 MG tablet Take 200 mg by mouth  every 6 (six) hours as needed for mild pain or moderate pain.     . meclizine (ANTIVERT) 25 MG tablet Take 1 tablet (25 mg total) by mouth 3 (three) times daily as needed for dizziness. 30 tablet 0  . Multiple Vitamins-Minerals (PRESERVISION AREDS 2+MULTI VIT PO) Take 2 capsules by mouth daily.    . ondansetron (ZOFRAN ODT) 4 MG disintegrating tablet Take 1 tablet (4 mg total) by mouth every 8 (eight) hours as needed. 20 tablet 6  . hydrochlorothiazide (HYDRODIURIL) 12.5 MG tablet Take 0.5 tablets  (6.25 mg total) by mouth daily. 15 tablet 0  . rosuvastatin (CRESTOR) 5 MG tablet Take 1 tablet (5 mg total) by mouth every Monday, Wednesday, and Friday. 40 tablet 3  . sertraline (ZOLOFT) 50 MG tablet Take 1 tablet (50 mg total) by mouth daily. 90 tablet 3   No facility-administered medications prior to visit.     Per HPI unless specifically indicated in ROS section below Review of Systems  Constitutional: Negative for activity change, appetite change, chills, fatigue, fever and unexpected weight change.  HENT: Negative for hearing loss.   Eyes: Negative for visual disturbance.  Respiratory: Negative for cough, chest tightness, shortness of breath and wheezing.   Cardiovascular: Negative for chest pain, palpitations and leg swelling.  Gastrointestinal: Negative for abdominal distention, abdominal pain, blood in stool, constipation, diarrhea, nausea and vomiting.  Genitourinary: Negative for difficulty urinating and hematuria.  Musculoskeletal: Negative for arthralgias, myalgias and neck pain.  Skin: Negative for rash.  Neurological: Positive for dizziness and syncope (last month). Negative for seizures and headaches.  Hematological: Negative for adenopathy. Does not bruise/bleed easily.  Psychiatric/Behavioral: Positive for dysphoric mood. The patient is nervous/anxious.    Objective:  BP 116/66 (BP Location: Left Arm, Patient Position: Sitting, Cuff Size: Normal)   Pulse 78   Temp (!) 97.5 F (36.4 C) (Temporal)   Ht 5\' 4"  (1.626 m)   Wt 183 lb 1 oz (83 kg)   SpO2 98%   BMI 31.42 kg/m   Wt Readings from Last 3 Encounters:  06/23/20 183 lb 1 oz (83 kg)  06/13/20 184 lb 5 oz (83.6 kg)  06/02/20 179 lb (81.2 kg)      Physical Exam Vitals and nursing note reviewed.  Constitutional:      General: She is not in acute distress.    Appearance: Normal appearance. She is well-developed and well-nourished. She is not ill-appearing.  HENT:     Head: Normocephalic and atraumatic.      Right Ear: Hearing, tympanic membrane, ear canal and external ear normal.     Left Ear: Hearing, tympanic membrane, ear canal and external ear normal.     Mouth/Throat:     Mouth: Oropharynx is clear and moist and mucous membranes are normal.     Pharynx: Uvula midline. No posterior oropharyngeal edema.  Eyes:     General: No scleral icterus.    Extraocular Movements: Extraocular movements intact and EOM normal.     Conjunctiva/sclera: Conjunctivae normal.     Pupils: Pupils are equal, round, and reactive to light.  Cardiovascular:     Rate and Rhythm: Normal rate and regular rhythm.     Pulses: Normal pulses and intact distal pulses.          Radial pulses are 2+ on the right side and 2+ on the left side.     Heart sounds: Normal heart sounds. No murmur heard.   Pulmonary:     Effort: Pulmonary  effort is normal. No respiratory distress.     Breath sounds: Normal breath sounds. No wheezing, rhonchi or rales.  Abdominal:     General: Abdomen is flat. Bowel sounds are normal. There is no distension.     Palpations: Abdomen is soft. There is no mass.     Tenderness: There is no abdominal tenderness. There is no guarding or rebound.     Hernia: No hernia is present.  Musculoskeletal:        General: No edema. Normal range of motion.     Cervical back: Normal range of motion and neck supple.     Right lower leg: No edema.     Left lower leg: No edema.  Lymphadenopathy:     Cervical: No cervical adenopathy.  Skin:    General: Skin is warm and dry.     Findings: No rash.  Neurological:     General: No focal deficit present.     Mental Status: She is alert and oriented to person, place, and time.     Comments: CN grossly intact, station and gait intact  Psychiatric:        Mood and Affect: Mood and affect and mood normal.        Behavior: Behavior normal.        Thought Content: Thought content normal.        Judgment: Judgment normal.       Results for orders placed or  performed in visit on 06/19/20  CBC with Differential/Platelet  Result Value Ref Range   WBC 6.7 4.0 - 10.5 K/uL   RBC 4.30 3.87 - 5.11 Mil/uL   Hemoglobin 12.0 12.0 - 15.0 g/dL   HCT 37.3 36.0 - 46.0 %   MCV 86.7 78.0 - 100.0 fl   MCHC 32.2 30.0 - 36.0 g/dL   RDW 14.2 11.5 - 15.5 %   Platelets 225.0 150.0 - 400.0 K/uL   Neutrophils Relative % 59.4 43.0 - 77.0 %   Lymphocytes Relative 29.0 12.0 - 46.0 %   Monocytes Relative 9.6 3.0 - 12.0 %   Eosinophils Relative 1.5 0.0 - 5.0 %   Basophils Relative 0.5 0.0 - 3.0 %   Neutro Abs 4.0 1.4 - 7.7 K/uL   Lymphs Abs 2.0 0.7 - 4.0 K/uL   Monocytes Absolute 0.6 0.1 - 1.0 K/uL   Eosinophils Absolute 0.1 0.0 - 0.7 K/uL   Basophils Absolute 0.0 0.0 - 0.1 K/uL  VITAMIN D 25 Hydroxy (Vit-D Deficiency, Fractures)  Result Value Ref Range   VITD 36.88 30.00 - 100.00 ng/mL  TSH  Result Value Ref Range   TSH 3.11 0.35 - 4.50 uIU/mL  Lipid panel  Result Value Ref Range   Cholesterol 192 0 - 200 mg/dL   Triglycerides 129.0 0.0 - 149.0 mg/dL   HDL 50.00 >39.00 mg/dL   VLDL 25.8 0.0 - 40.0 mg/dL   LDL Cholesterol 116 (H) 0 - 99 mg/dL   Total CHOL/HDL Ratio 4    NonHDL 141.60   Comprehensive metabolic panel  Result Value Ref Range   Sodium 139 135 - 145 mEq/L   Potassium 3.9 3.5 - 5.1 mEq/L   Chloride 102 96 - 112 mEq/L   CO2 30 19 - 32 mEq/L   Glucose, Bld 98 70 - 99 mg/dL   BUN 17 6 - 23 mg/dL   Creatinine, Ser 0.74 0.40 - 1.20 mg/dL   Total Bilirubin 0.5 0.2 - 1.2 mg/dL   Alkaline Phosphatase 65 39 - 117  U/L   AST 15 0 - 37 U/L   ALT 13 0 - 35 U/L   Total Protein 6.9 6.0 - 8.3 g/dL   Albumin 4.0 3.5 - 5.2 g/dL   GFR 82.43 >60.00 mL/min   Calcium 9.3 8.4 - 10.5 mg/dL   Assessment & Plan:  This visit occurred during the SARS-CoV-2 public health emergency.  Safety protocols were in place, including screening questions prior to the visit, additional usage of staff PPE, and extensive cleaning of exam room while observing appropriate  contact time as indicated for disinfecting solutions.   Problem List Items Addressed This Visit    Vitamin D deficiency    Continue vit D replacement.       Osteopenia    Reviewed calcium and vit D intake and regular weight bearing exercise. She is not currently taking calcium supplement - will look into chewable calcium. Reviewed dietary calcium.       Obesity, Class I, BMI 30-34.9    Encourage healthy diet and lifestyle choices.       Meniere disease, left    Presumed. Tolerating hctz 6.25mg  daily. Discussed option to increase dose to 12.5mg . keep ENT f/u next week.       Hyperlipidemia    Chronic stable on low dose crestor MWF. Continue current regimen. The 10-year ASCVD risk score Mikey Bussing DC Brooke Bonito., et al., 2013) is: 9.5%   Values used to calculate the score:     Age: 34 years     Sex: Female     Is Non-Hispanic African American: No     Diabetic: No     Tobacco smoker: No     Systolic Blood Pressure: 536 mmHg     Is BP treated: Yes     HDL Cholesterol: 50 mg/dL     Total Cholesterol: 192 mg/dL       Relevant Medications   hydrochlorothiazide (HYDRODIURIL) 12.5 MG tablet   rosuvastatin (CRESTOR) 5 MG tablet   Healthcare maintenance - Primary    Preventative protocols reviewed and updated unless pt declined. Discussed healthy diet and lifestyle.       GAD (generalized anxiety disorder)    Ongoing trouble, worse since sister's passing and recent syncopal episode. Discussed unresolved trauma in her life. Declines counseling referral at this time - states not ready yet. Will increase sertraline to 75mg  daily. Encouraged she go to pickle ball Monday classes and encouraged social engagement.       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           Meds ordered this encounter  Medications  . sertraline (ZOLOFT) 50 MG tablet    Sig:  Take 1.5 tablets (75 mg total) by mouth daily.    Dispense:  135 tablet    Refill:  3  . hydrochlorothiazide (HYDRODIURIL) 12.5 MG tablet    Sig: Take 0.5 tablets (6.25 mg total) by mouth daily.    Dispense:  15 tablet    Refill:  6  . rosuvastatin (CRESTOR) 5 MG tablet    Sig: Take 1 tablet (5 mg total) by mouth every Monday, Wednesday, and Friday.    Dispense:  40 tablet    Refill:  3   No orders of the defined types were placed in this encounter.   Patient instructions: Increase sertraline to 75mg  daily.  Good  to see you today  Return as needed or in 1 year for next physical.    Follow up plan: Return in about 1 year (around 06/23/2021) for annual exam, prior fasting for blood work, medicare wellness visit.  Ria Bush, MD

## 2020-07-01 DIAGNOSIS — R42 Dizziness and giddiness: Secondary | ICD-10-CM | POA: Diagnosis not present

## 2020-07-01 DIAGNOSIS — H8102 Meniere's disease, left ear: Secondary | ICD-10-CM | POA: Diagnosis not present

## 2020-07-01 DIAGNOSIS — H903 Sensorineural hearing loss, bilateral: Secondary | ICD-10-CM | POA: Diagnosis not present

## 2020-09-15 DIAGNOSIS — H353232 Exudative age-related macular degeneration, bilateral, with inactive choroidal neovascularization: Secondary | ICD-10-CM | POA: Diagnosis not present

## 2020-10-27 ENCOUNTER — Ambulatory Visit: Payer: PPO | Attending: Critical Care Medicine

## 2020-10-27 DIAGNOSIS — Z20822 Contact with and (suspected) exposure to covid-19: Secondary | ICD-10-CM | POA: Diagnosis not present

## 2020-10-28 LAB — NOVEL CORONAVIRUS, NAA: SARS-CoV-2, NAA: NOT DETECTED

## 2020-10-28 LAB — SARS-COV-2, NAA 2 DAY TAT

## 2021-01-09 ENCOUNTER — Emergency Department (HOSPITAL_COMMUNITY): Payer: PPO

## 2021-01-09 ENCOUNTER — Other Ambulatory Visit: Payer: Self-pay

## 2021-01-09 ENCOUNTER — Encounter (HOSPITAL_COMMUNITY): Payer: Self-pay | Admitting: Emergency Medicine

## 2021-01-09 ENCOUNTER — Telehealth: Payer: Self-pay

## 2021-01-09 ENCOUNTER — Emergency Department (HOSPITAL_COMMUNITY)
Admission: EM | Admit: 2021-01-09 | Discharge: 2021-01-09 | Disposition: A | Discharge status: Home / Self Care | Payer: PPO | Attending: Emergency Medicine | Admitting: Emergency Medicine

## 2021-01-09 DIAGNOSIS — R079 Chest pain, unspecified: Secondary | ICD-10-CM | POA: Diagnosis not present

## 2021-01-09 DIAGNOSIS — R0789 Other chest pain: Secondary | ICD-10-CM | POA: Diagnosis not present

## 2021-01-09 LAB — DIFFERENTIAL
Abs Immature Granulocytes: 0.02 10*3/uL (ref 0.00–0.07)
Basophils Absolute: 0 10*3/uL (ref 0.0–0.1)
Basophils Relative: 0 %
Eosinophils Absolute: 0.1 10*3/uL (ref 0.0–0.5)
Eosinophils Relative: 1 %
Immature Granulocytes: 0 %
Lymphocytes Relative: 29 %
Lymphs Abs: 2.2 10*3/uL (ref 0.7–4.0)
Monocytes Absolute: 0.6 10*3/uL (ref 0.1–1.0)
Monocytes Relative: 8 %
Neutro Abs: 4.6 10*3/uL (ref 1.7–7.7)
Neutrophils Relative %: 62 %

## 2021-01-09 LAB — HEPATIC FUNCTION PANEL
ALT: 13 U/L (ref 0–44)
AST: 22 U/L (ref 15–41)
Albumin: 3.7 g/dL (ref 3.5–5.0)
Alkaline Phosphatase: 66 U/L (ref 38–126)
Bilirubin, Direct: <0.1 mg/dL (ref 0.0–0.2)
Total Bilirubin: 0.5 mg/dL (ref 0.3–1.2)
Total Protein: 6.8 g/dL (ref 6.5–8.1)

## 2021-01-09 LAB — TROPONIN I (HIGH SENSITIVITY)
Troponin I (High Sensitivity): 4 ng/L (ref <18)
Troponin I (High Sensitivity): 4 ng/L (ref <18)

## 2021-01-09 LAB — CBC
HCT: 36.4 % (ref 36.0–46.0)
Hemoglobin: 11.8 g/dL — ABNORMAL LOW (ref 12.0–15.0)
MCH: 28.4 pg (ref 26.0–34.0)
MCHC: 32.4 g/dL (ref 30.0–36.0)
MCV: 87.7 fL (ref 80.0–100.0)
Platelets: 281 10*3/uL (ref 150–400)
RBC: 4.15 MIL/uL (ref 3.87–5.11)
RDW: 13.7 % (ref 11.5–15.5)
WBC: 7.5 10*3/uL (ref 4.0–10.5)
nRBC: 0 % (ref 0.0–0.2)

## 2021-01-09 LAB — BASIC METABOLIC PANEL
Anion gap: 9 (ref 5–15)
BUN: 12 mg/dL (ref 8–23)
CO2: 23 mmol/L (ref 22–32)
Calcium: 9.3 mg/dL (ref 8.9–10.3)
Chloride: 107 mmol/L (ref 98–111)
Creatinine, Ser: 0.8 mg/dL (ref 0.44–1.00)
GFR, Estimated: >60 mL/min (ref >60)
Glucose, Bld: 91 mg/dL (ref 70–99)
Potassium: 4.1 mmol/L (ref 3.5–5.1)
Sodium: 139 mmol/L (ref 135–145)

## 2021-01-09 LAB — D-DIMER, QUANTITATIVE: D-Dimer, Quant: 0.29 ug/mL-FEU (ref 0.00–0.50)

## 2021-01-09 MED ORDER — ACETAMINOPHEN 500 MG PO TABS
1000.0000 mg | ORAL_TABLET | Freq: Once | ORAL | Status: AC
  Administered 2021-01-09: 1000 mg via ORAL
  Filled 2021-01-09: qty 2

## 2021-01-09 NOTE — Telephone Encounter (Signed)
Pt is currently in the ER. 

## 2021-01-09 NOTE — Telephone Encounter (Signed)
Yolanda Walsh said access nurse called and pt having constant CP for 3 days and was advised to call 911 but pt refused and stated that is why she has a PCP. I called pt and pt said for 3 days having uncomfortable feeling in lt side of chest with dull ache that radiates to lt shoulder; taking a deep breath increases the pain. Lt side of jaw hurts;Advil not helping. No known injury. Last night pt was resting and broke out in sweat for no reason. Pt refuses 911 but will have someone drive her to [facility] ED now. Ed precautions given and pt said she understands and will go now to ED. Sending note to Dr Darnell Level and Lattie Haw CMA.

## 2021-01-09 NOTE — Discharge Instructions (Signed)
You have been seen and discharged from the emergency department.  Your cardiac work-up and blood work were normal.  Follow-up with your primary provider for reevaluation and further care. Take home medications as prescribed. If you have any worsening symptoms or further concerns for your health please return to an emergency department for further evaluation.

## 2021-01-09 NOTE — ED Triage Notes (Addendum)
Pt reports left sided constant, aching chest pain that radiates to her shoulder for the past 3 days. Worse with movement and deep breaths, pt reports diaphoretic episode last night. Denies any injuries.

## 2021-01-09 NOTE — ED Notes (Signed)
Patient left ED ambulatory with steady independent gait and is A/O. 

## 2021-01-09 NOTE — Telephone Encounter (Signed)
[facility] - Client TELEPHONE ADVICE RECORD [facility] Patient Name: Yolanda Walsh Gender: Female DOB: [birth year] Age: 70 Y 2 M 11 D Return Phone Number: [phone] (Primary) Address: City/ State/ Zip: [place]  [address] Client [facility] - Client Client Site [place] - Day Physician Ria Bush - MD Contact Type Call Who Is Calling Patient / Member / Family / Caregiver Call Type Triage / Clinical Relationship To Patient Self Return Phone Number [phone] (Primary) Chief Complaint CHEST PAIN - pain, pressure, heaviness or tightness Reason for Call Symptomatic / Request for [place] states she has chest pain, discomfort. Going on for three days. Steady ache. Breaking out in a sweat. Back to her shoulder. Translation No Nurse Assessment Nurse: Clovis Riley, RN, Georgina Peer Date/Time (Eastern Time): 01/09/2021 8:08:17 AM Confirm and document reason for call. If symptomatic, describe symptoms. ---Caller states she has chest pain, discomfort that has been Going on for three days. Steady ache but worse with taking a deep breath. Breaking out in a sweat. Radiates to Back to her left shoulder. Pain is 5/10. Does the patient have any new or worsening symptoms? ---Yes Will a triage be completed? ---Yes Related visit to physician within the last 2 weeks? ---No Does the PT have any chronic conditions? (i.e. diabetes, asthma, this includes High risk factors for pregnancy, etc.) ---Yes List chronic conditions. ---anxiety Is this a behavioral health or substance abuse call? ---No Guidelines Guideline Title Affirmed Question Affirmed Notes Nurse Date/Time Eilene Ghazi Time) Chest Pain [1] Chest pain lasts > 5 minutes AND [2] age > 12 Clovis Riley, RN, Georgina Peer 01/09/2021 8:10:43 AM Disp. Time Eilene Ghazi Time) Disposition Final User 01/09/2021 8:07:13 AM Send to Urgent Queue  Baruch Goldmann 01/09/2021 8:15:40 AM 911 Outcome Documentation [place], RN, Georgina Peer PLEASE NOTE: All timestamps contained within this report are represented as [place] Standard Time. CONFIDENTIALTY NOTICE: This fax transmission is intended only for the addressee. It contains information that is legally privileged, confidential or otherwise protected from use or disclosure. If you are not the intended recipient, you are strictly prohibited from reviewing, disclosing, copying using or disseminating any of this information or taking any action in reliance on or regarding this information. If you have received this fax in error, please notify us immediately by telephone so that we can arrange for its return to [place]. Phone: [phone], Toll-Free: [phone], Fax: [phone] Page: 2 of 2 Call Id: [id number] [place]. Time Eilene Ghazi Time) Disposition Final User Reason: pt refuses 911 and ER, office notified and will give her a call back 01/09/2021 8:13:33 AM Call EMS 911 Now Yes Clovis Riley, RN, Leilani Merl Disagree/Comply Disagree Caller Understands Yes PreDisposition Call Doctor Care Advice Given Per Guideline CALL EMS 911 NOW: * Immediate medical attention is needed. You need to hang up and call 911 (or an ambulance). * Triager Discretion: I'll call you back in a few minutes to be sure you were able to reach them. NOTE TO TRIAGER - IF CALLER ASKS ABOUT ASPIRIN: * Call EMS 911 first. * If the patient is not allergic to aspirin, they can chew an aspirin while waiting for the ambulance to arrive. CARE ADVICE given per Chest Pain (Adult) guideline. Comments User: Baruch Goldmann Date/Time Eilene Ghazi Time): 01/09/2021 8:06:05 AM Caller was transferred from the office. Referrals GO TO FACILITY REFUSED

## 2021-01-09 NOTE — ED Provider Notes (Signed)
[facility] EMERGENCY DEPARTMENT Provider Note   CSN: [id number] Arrival date & time: 01/09/21  0924     History Chief Complaint  Patient presents with   Chest Pain    Yolanda Walsh is a 70 y.o. female.  HPI  70 year old female with no significant past medical history presents emergency department concern for left-sided chest pain.  Patient states for the last 3 days she has had a constant pressure-like discomfort in the left chest.  It starts anteriorly around her left breast and extends laterally around to her ribs.  Denies any jaw pain, arm pain, pain between her shoulder blades.  She has never had discomfort like this before.  Last night while sitting down watching TV she had an episode that she described as diaphoresis, but this self resolved.  No cough, no recent fever, no swelling of her lower extremities.  The pain is worse with deep inspiration and certain movements.  Otherwise it is not positional.  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), bilateral 06/07/2017   Advanced care planning/counseling discussion 05/27/2016   Obesity, Class I, BMI 30-34.9 05/27/2016   Tinnitus 01/29/2013   Healthcare maintenance 04/24/2012   Hyperlipidemia 04/24/2012   Osteopenia 01/17/2008   GAD (generalized anxiety disorder) 01/17/2008    Past Surgical History:  Procedure Laterality  Date   CATARACT EXTRACTION W/PHACO Left 07/04/2019   Procedure: CATARACT EXTRACTION PHACO AND INTRAOCULAR LENS PLACEMENT (IOC) LEFT  6.67  00:44.0  15.2%;  Surgeon: Leandrew Koyanagi, MD;  Location: [facility];  Service: Ophthalmology;  Laterality: Left;   CATARACT EXTRACTION W/PHACO Right 08/01/2019   Procedure: CATARACT EXTRACTION PHACO AND INTRAOCULAR LENS PLACEMENT (IOC) RIGHT 5.33  00:45.4  11.8%;  Surgeon: Leandrew Koyanagi, MD;  Location: [facility];  Service: Ophthalmology;  Laterality: Right;   COLONOSCOPY  09/2003   COLONOSCOPY  07/2016   TA, diverticulosis, rpt 5 yrs Henrene Pastor)   dexa  04/2012   T -2.1 hip, -1.2 spine   dexa  03/2015   T -2.4 hip, -1.7 spine   I & D EXTREMITY Right 03/16/2014   cat bite hand s/p I&D in OR ([name])   left hand surgery  1962   tendon laceration   PARTIAL HYSTERECTOMY  1980   for prolapse, ovaries remain     OB History   No obstetric history on file.     Family History  Problem Relation Age of Onset   Cancer Mother        leukemia   Coronary artery disease Mother 40   Coronary artery disease Father 19       MI   Parkinsonism Brother 35       deceased   Stroke Brother    CAD Brother 70  7 stents   Hypertension Brother    Hypertension Sister    Crohn's disease Sister    Diabetes Maternal Grandfather    CAD Other 17       stent    Social History   Tobacco Use   Smoking status: Never   Smokeless tobacco: Never  Vaping Use   Vaping Use: Never used  Substance Use Topics   Alcohol use: Not Currently   Drug use: No    Home Medications Prior to Admission medications   Medication Sig Start Date End Date Taking? Authorizing Provider  cetirizine (ZYRTEC) 10 MG tablet Take 10 mg by mouth daily.    [provider]  Cholecalciferol (VITAMIN D3) 1000 UNITS CAPS Take 1 capsule (1,000 Units total) by mouth daily. 04/12/15   Ria Bush, MD  clobetasol cream (TEMOVATE) 0.05 % SMARTSIG:1 Topical  Every Night 05/06/20   [provider]  hydrochlorothiazide (HYDRODIURIL) 12.5 MG tablet Take 0.5 tablets (6.25 mg total) by mouth daily. 06/23/20   Ria Bush, MD  ibuprofen (ADVIL,MOTRIN) 200 MG tablet Take 200 mg by mouth every 6 (six) hours as needed for mild pain or moderate pain.     [provider]  meclizine (ANTIVERT) 25 MG tablet Take 1 tablet (25 mg total) by mouth 3 (three) times daily as needed for dizziness. 09/18/19   Maudie Flakes, MD  Multiple Vitamins-Minerals (PRESERVISION AREDS 2+MULTI VIT PO) Take 2 capsules by mouth daily.    [provider]  ondansetron (ZOFRAN ODT) 4 MG disintegrating tablet Take 1 tablet (4 mg total) by mouth every 8 (eight) hours as needed. 10/29/19   Marcial Pacas, MD  rosuvastatin (CRESTOR) 5 MG tablet Take 1 tablet (5 mg total) by mouth every Monday, Wednesday, and Friday. 06/23/20   Ria Bush, MD  sertraline (ZOLOFT) 50 MG tablet Take 1.5 tablets (75 mg total) by mouth daily. 06/23/20   Ria Bush, MD    Allergies    Patient has no known allergies.  Review of Systems   Review of Systems  Constitutional:  Negative for chills and fever.  HENT:  Negative for congestion.   Eyes:  Negative for visual disturbance.  Respiratory:  Positive for shortness of breath.   Cardiovascular:  Positive for chest pain. Negative for palpitations and leg swelling.  Gastrointestinal:  Negative for abdominal pain, diarrhea and vomiting.  Genitourinary:  Negative for dysuria.  Skin:  Negative for rash.  Neurological:  Negative for headaches.   Physical Exam Updated Vital Signs BP 130/62   Pulse 63   Temp 98.1 F (36.7 C) (Oral)   Resp 13   Ht 5\' 4"  (1.626 m)   Wt 83.9 kg   SpO2 97%   BMI 31.76 kg/m   Physical Exam Vitals and nursing note reviewed.  Constitutional:      General: She is not in acute distress.    Appearance: Normal appearance. She is not toxic-appearing or diaphoretic.  HENT:     Head:  Normocephalic.     Mouth/Throat:     Mouth: Mucous membranes are moist.  Cardiovascular:     Rate and Rhythm: Normal rate.  Pulmonary:     Effort: Pulmonary effort is normal. No respiratory distress.     Breath sounds: No decreased breath sounds.  Chest:     Comments: No rash along the left chest, mild reproducible tenderness Abdominal:     Palpations: Abdomen is soft.     Tenderness: There is no abdominal tenderness.  Musculoskeletal:  Right lower leg: No edema.     Left lower leg: No edema.  Skin:    General: Skin is warm.  Neurological:     Mental Status: She is alert and oriented to person, place, and time. Mental status is at baseline.  Psychiatric:        Mood and Affect: Mood normal.    ED Results / Procedures / Treatments   Labs (all labs ordered are listed, but only abnormal results are displayed) Labs Reviewed  CBC - Abnormal; Notable for the following components:      Result Value   Hemoglobin 11.8 (*)    All other components within normal limits  BASIC METABOLIC PANEL  HEPATIC FUNCTION PANEL  DIFFERENTIAL  D-DIMER, QUANTITATIVE  TROPONIN I (HIGH SENSITIVITY)  TROPONIN I (HIGH SENSITIVITY)    EKG EKG Interpretation  Date/Time:  Friday January 09 2021 10:27:25 EDT Ventricular Rate:  77 PR Interval:  138 QRS Duration: 72 QT Interval:  374 QTC Calculation: 423 R Axis:   23 Text Interpretation: Normal sinus rhythm Cannot rule out Anterior infarct , age undetermined Abnormal ECG Similar to previous Confirmed by Lavenia Atlas [phone]) on 01/09/2021 1:10:26 PM  Radiology DG Chest 2 View  Result Date: 01/09/2021 CLINICAL DATA:  70 year old female with chest pain. EXAM: CHEST - 2 VIEW COMPARISON:  CTA chest abdomen pelvis 06/02/2020 and earlier. FINDINGS: Lung volumes and mediastinal contours remain normal. Visualized tracheal air column is within normal limits. Both lungs appear clear. No pneumothorax or pleural effusion. No acute osseous abnormality identified.  Negative visible bowel gas pattern. IMPRESSION: No acute cardiopulmonary abnormality. Electronically Signed   By: Genevie Ann M.D.   On: 01/09/2021 10:15    Procedures Procedures   Medications Ordered in ED Medications - No data to display  ED Course  I have reviewed the triage vital signs and the nursing notes.  Pertinent labs & imaging results that were available during my care of the patient were reviewed by me and considered in my medical decision making (see chart for details).    MDM Rules/Calculators/A&P                          70 year old female presents with left sided chest pain. EKG is unchanged.  She has no overlying skin changes or rash.  Vitals are otherwise normal and stable.  No shortness of breath.  Chest x-ray is unremarkable, blood work is reassuring, 2 negative troponins without a delta, D-dimer is negative.  I am very reassured with a low suspicion for ACS/PE/dissection.  She has no pain that goes through to her back, equal radial pulses in bilateral upper extremities.  She is very well-appearing, and I do not see the need for any further emergent imaging/treatment.  She will follow-up as an outpatient.  Patient will be discharged and treated as an outpatient.  Discharge plan and strict return to ED precautions discussed, patient verbalizes understanding and agreement.  Final Clinical Impression(s) / ED Diagnoses Final diagnoses:  None    Rx / DC Orders ED Discharge Orders     None        Lorelle Gibbs, DO 01/09/21 1633

## 2021-01-09 NOTE — ED Provider Notes (Addendum)
Emergency Medicine Provider Triage Evaluation Note  Yolanda Walsh , a 70 y.o. female  was evaluated in triage.  Pt complains of left-sided chest pain.  Pain has been constant over the last 3 days.  Pain has waxed and waned in intensity over this time.  Describes pain as an aching.  No radiation of pain.  Patient reports that she had episode of diaphoresis last night associated with her pain.  Denies any associated nausea, vomiting, shortness of breath.  Denies any recent falls or traumatic injuries.  Patient reports that she did have a dental surgery done recently however this was without any general anesthesia.  No hx of PE/DVT, leg swelling, hormone therapy, hemoptysis, or cancer diagnosis.  Review of Systems  Positive: Chest pain, diaphoresis Negative: Shortness of breath, nausea, vomiting, abdominal pain  Physical Exam  BP 133/67 (BP Location: Left Arm)   Pulse 80   Temp 98.1 F (36.7 C) (Oral)   Resp 16   Ht 5\' 4"  (1.626 m)   Wt 83.9 kg   SpO2 98%   BMI 31.76 kg/m  Gen:   Awake, no distress   Resp:  Normal effort , lungs clear to auscultation bilaterally MSK:   Moves extremities without difficulty, no swelling/edema or tenderness to bilateral lower extremities Other:  S1, S2 present with no murmurs rubs or gallops.  Medical Decision Making  Medically screening exam initiated at 9:43 AM.  Appropriate orders placed.  [name] was informed that the remainder of the evaluation will be completed by another provider, this initial triage assessment does not replace that evaluation, and the importance of remaining in the ED until their evaluation is complete.  The patient appears stable so that the remainder of the work up may be completed by another provider.      Loni Beckwith, PA-C 01/09/21 0945    Loni Beckwith, PA-C 01/09/21 1040    Horton, Alvin Critchley, DO 01/12/21 1834

## 2021-01-09 NOTE — ED Notes (Signed)
Patient states she had dental work recently on the left side and continues to have pain in the left jaw. Patient reports intermittent bleeding and a pustule in the area of the dental work. Patient states left breast and shoulder pain is worse with lying on her side.

## 2021-01-11 NOTE — Telephone Encounter (Signed)
ER records reviewed. Had reassuring ED evaluation.  Plz call for update on symptoms, if ongoing chest discomfort symptoms, rec OV for further evaluation.

## 2021-01-13 NOTE — Telephone Encounter (Signed)
Spoke with pt asking for update.  Says she feels better but still having some discomfort.  Told pain seems to be musculoskeletal and to get rest.  Offered 12:30 tomorrow but pt states she has other things to do tomorrow.  Says she's not pressed for OV.   Pt scheduled in next available slot on 01/19/21 at 12:00.

## 2021-01-16 DIAGNOSIS — H903 Sensorineural hearing loss, bilateral: Secondary | ICD-10-CM | POA: Diagnosis not present

## 2021-01-16 DIAGNOSIS — H8102 Meniere's disease, left ear: Secondary | ICD-10-CM | POA: Diagnosis not present

## 2021-01-18 NOTE — Progress Notes (Signed)
Patient ID: Yolanda Walsh, female    DOB: 1951/06/11, 70 y.o.   MRN: [id number]  This visit was conducted in person.  BP 112/70 (BP Location: Left Arm, Patient Position: Sitting, Cuff Size: Normal)   Pulse 70   Temp (!) 97.2 F (36.2 C) (Temporal)   Ht 5\' 4"  (1.626 m)   Wt 187 lb 1 oz (84.9 kg)   SpO2 99%   BMI 32.11 kg/m    CC: ER f/u visit  Subjective:   HPI: Yolanda Walsh is a 70 y.o. female presenting on 01/19/2021 for ER Follow Up   Recent ER visit for L sided chest pain. Records reviewed. 3d h/o pressure-like discomfort to L chest associated with episode of diaphoresis, some radiation to left upper back as well as reproducible pain with deep breaths. Labs including D-dimer and troponins x2 normal, EKG, CXR reassuring. Overall reassuring evaluation, recommended PCP f/u. ?muscular pain.   She had just had dental work to left jaw (new crown) end of June.  Since home, no further chest pain, overall feeling well. Hasn't tried anything for this.   Saw [facility] last week for left ear meniere's disease - told may need hearing aides. She has not been taking hctz at all. Infrequent mild vertigo episodes.      Relevant past medical, surgical, family and social history reviewed and updated as indicated. Interim medical history since our last visit reviewed. Allergies and medications reviewed and updated. Outpatient Medications Prior to Visit  Medication Sig Dispense Refill   cetirizine (ZYRTEC) 10 MG tablet Take 10 mg by mouth daily.     Cholecalciferol (VITAMIN D3) 1000 UNITS CAPS Take 1 capsule (1,000 Units total) by mouth daily. 30 capsule    clobetasol cream (TEMOVATE) 0.05 % SMARTSIG:1 Topical Every Night     ibuprofen (ADVIL,MOTRIN) 200 MG tablet Take 200 mg by mouth every 6 (six) hours as needed for mild pain or moderate pain.      meclizine (ANTIVERT) 25 MG tablet Take 1 tablet (25 mg total) by mouth 3 (three) times daily as needed for dizziness. 30 tablet 0   Multiple  Vitamins-Minerals (PRESERVISION AREDS 2+MULTI VIT PO) Take 2 capsules by mouth daily.     ondansetron (ZOFRAN ODT) 4 MG disintegrating tablet Take 1 tablet (4 mg total) by mouth every 8 (eight) hours as needed. 20 tablet 6   rosuvastatin (CRESTOR) 5 MG tablet Take 1 tablet (5 mg total) by mouth every Monday, Wednesday, and Friday. 40 tablet 3   sertraline (ZOLOFT) 50 MG tablet Take 1.5 tablets (75 mg total) by mouth daily. 135 tablet 3   hydrochlorothiazide (HYDRODIURIL) 12.5 MG tablet Take 0.5 tablets (6.25 mg total) by mouth daily. 15 tablet 6   No facility-administered medications prior to visit.     Per HPI unless specifically indicated in ROS section below Review of Systems  Objective:  BP 112/70 (BP Location: Left Arm, Patient Position: Sitting, Cuff Size: Normal)   Pulse 70   Temp (!) 97.2 F (36.2 C) (Temporal)   Ht 5\' 4"  (1.626 m)   Wt 187 lb 1 oz (84.9 kg)   SpO2 99%   BMI 32.11 kg/m   Wt Readings from Last 3 Encounters:  01/19/21 187 lb 1 oz (84.9 kg)  01/09/21 185 lb (83.9 kg)  06/23/20 183 lb 1 oz (83 kg)      Physical Exam Vitals and nursing note reviewed.  Constitutional:      Appearance: Normal appearance. She is  not ill-appearing.  Cardiovascular:     Rate and Rhythm: Normal rate and regular rhythm.     Pulses: Normal pulses.     Heart sounds: Normal heart sounds. No murmur heard. Pulmonary:     Effort: Pulmonary effort is normal. No respiratory distress.     Breath sounds: Normal breath sounds. No wheezing, rhonchi or rales.  Chest:     Chest wall: Tenderness present.       Comments: Reproducible chest wall tenderness along 2nd and 4th costochondral juncture Skin:    General: Skin is warm and dry.     Findings: No rash.  Neurological:     Mental Status: She is alert.  Psychiatric:        Mood and Affect: Mood normal.        Behavior: Behavior normal.      Results for orders placed or performed during the hospital encounter of 16/60/63  Basic  metabolic panel  Result Value Ref Range   Sodium 139 135 - 145 mmol/L   Potassium 4.1 3.5 - 5.1 mmol/L   Chloride 107 98 - 111 mmol/L   CO2 23 22 - 32 mmol/L   Glucose, Bld 91 70 - 99 mg/dL   BUN 12 8 - 23 mg/dL   Creatinine, Ser 0.80 0.44 - 1.00 mg/dL   Calcium 9.3 8.9 - 10.3 mg/dL   GFR, Estimated >60 >60 mL/min   Anion gap 9 5 - 15  CBC  Result Value Ref Range   WBC 7.5 4.0 - 10.5 K/uL   RBC 4.15 3.87 - 5.11 MIL/uL   Hemoglobin 11.8 (L) 12.0 - 15.0 g/dL   HCT 36.4 36.0 - 46.0 %   MCV 87.7 80.0 - 100.0 fL   MCH 28.4 26.0 - 34.0 pg   MCHC 32.4 30.0 - 36.0 g/dL   RDW 13.7 11.5 - 15.5 %   Platelets 281 150 - 400 K/uL   nRBC 0.0 0.0 - 0.2 %  Hepatic function panel  Result Value Ref Range   Total Protein 6.8 6.5 - 8.1 g/dL   Albumin 3.7 3.5 - 5.0 g/dL   AST 22 15 - 41 U/L   ALT 13 0 - 44 U/L   Alkaline Phosphatase 66 38 - 126 U/L   Total Bilirubin 0.5 0.3 - 1.2 mg/dL   Bilirubin, Direct <0.1 0.0 - 0.2 mg/dL   Indirect Bilirubin NOT CALCULATED 0.3 - 0.9 mg/dL  Differential  Result Value Ref Range   Neutrophils Relative % 62 %   Neutro Abs 4.6 1.7 - 7.7 K/uL   Lymphocytes Relative 29 %   Lymphs Abs 2.2 0.7 - 4.0 K/uL   Monocytes Relative 8 %   Monocytes Absolute 0.6 0.1 - 1.0 K/uL   Eosinophils Relative 1 %   Eosinophils Absolute 0.1 0.0 - 0.5 K/uL   Basophils Relative 0 %   Basophils Absolute 0.0 0.0 - 0.1 K/uL   Immature Granulocytes 0 %   Abs Immature Granulocytes 0.02 0.00 - 0.07 K/uL  D-dimer, quantitative  Result Value Ref Range   D-Dimer, Quant 0.29 0.00 - 0.50 ug/mL-FEU  Troponin I (High Sensitivity)  Result Value Ref Range   Troponin I (High Sensitivity) 4 <18 ng/L  Troponin I (High Sensitivity)  Result Value Ref Range   Troponin I (High Sensitivity) 4 <18 ng/L   Assessment & Plan:  This visit occurred during the SARS-CoV-2 public health emergency.  Safety protocols were in place, including screening questions prior to the visit, additional usage  of  staff PPE, and extensive cleaning of exam room while observing appropriate contact time as indicated for disinfecting solutions.   Problem List Items Addressed This Visit     Meniere disease, left    She has not been using hctz.  Discussed restart if meniere symptoms recur.  HCTZ 6.25mg  sent to pharmacy.        Hearing loss of left ear   Costochondritis, acute - Primary    ER records reviewed - including reassuring workup. Exam today suggesting acute costochondritis. Discussed with patient.  rec heating pad, topical voltaren gel, tylenol, update if not improving with this.          Meds ordered this encounter  Medications   hydrochlorothiazide (HYDRODIURIL) 12.5 MG tablet    Sig: Take 0.5 tablets (6.25 mg total) by mouth daily as needed (meniere's).    Dispense:  15 tablet    Refill:  6    No orders of the defined types were placed in this encounter.   Patient instructions: I think you have costochondritis or inflammation of joint of chest wall.  Treat with heating pad, voltaren gel topically. May use tylenol or ibuprofen (sparingly).  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   Follow up plan: No follow-ups on file.  Ria Bush, MD

## 2021-01-19 ENCOUNTER — Encounter: Payer: Self-pay | Admitting: Family Medicine

## 2021-01-19 ENCOUNTER — Ambulatory Visit (INDEPENDENT_AMBULATORY_CARE_PROVIDER_SITE_OTHER): Payer: PPO | Admitting: Family Medicine

## 2021-01-19 ENCOUNTER — Other Ambulatory Visit: Payer: Self-pay

## 2021-01-19 VITALS — BP 112/70 | HR 70 | Temp 97.2°F | Ht 64.0 in | Wt 187.1 lb

## 2021-01-19 DIAGNOSIS — H9192 Unspecified hearing loss, left ear: Secondary | ICD-10-CM

## 2021-01-19 DIAGNOSIS — M940 Chondrocostal junction syndrome [Tietze]: Secondary | ICD-10-CM | POA: Diagnosis not present

## 2021-01-19 DIAGNOSIS — H8102 Meniere's disease, left ear: Secondary | ICD-10-CM | POA: Diagnosis not present

## 2021-01-19 MED ORDER — HYDROCHLOROTHIAZIDE 12.5 MG PO TABS: 6.2500 mg | ORAL_TABLET | Freq: Every day | ORAL | 6 refills | Status: DC | PRN

## 2021-01-19 NOTE — Assessment & Plan Note (Signed)
ER records reviewed - including reassuring workup. Exam today suggesting acute costochondritis. Discussed with patient.  rec heating pad, topical voltaren gel, tylenol, update if not improving with this.

## 2021-01-19 NOTE — Assessment & Plan Note (Signed)
She has not been using hctz.  Discussed restart if meniere symptoms recur.  HCTZ 6.25mg  sent to pharmacy.

## 2021-01-19 NOTE — Patient Instructions (Addendum)
I think you have costochondritis or inflammation of joint of chest wall.  Treat with heating pad, voltaren gel topically (over the counter). May use tylenol or ibuprofen (sparingly).  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   Costochondritis  Costochondritis is inflammation of the tissue (cartilage) that connects the ribs to the breastbone (sternum). This causes pain in the front of the chest. The pain usually starts slowlyand involves more than one rib. What are the causes? The exact cause of this condition is not always known. It results from stress on the cartilage where your ribs attach to your sternum. The cause of this stress could be: Chest injury. Exercise or activity, such as lifting. Severe coughing. What increases the risk? You are more likely to develop this condition if you: Are female. Are 80-38 years old. Recently started a new exercise or work activity. Have low levels of vitamin D. Have a condition that makes you cough frequently. What are the signs or symptoms? The main symptom of this condition is chest pain. The pain: Usually starts gradually and can be sharp or dull. Gets worse with deep breathing, coughing, or exercise. Gets better with rest. May be worse when you press on the affected area of your ribs and sternum. How is this diagnosed? This condition is diagnosed based on your symptoms, your medical history, and a physical exam. Your health care provider will check for pain when pressing on your sternum. You may also have tests to rule out other causes of chest pain. These may include: A chest X-ray to check for lung problems. An ECG (electrocardiogram) to see if you have a heart problem that could be causing the pain. An imaging scan to rule out a chest or rib fracture. How is this treated? This condition usually goes away on its own over time. Your health care provider may prescribe an NSAID, such as ibuprofen,  to reduce pain and inflammation. Treatment may also include: Resting and avoiding activities that make pain worse. Applying heat or ice to the area to reduce pain and inflammation. Doing exercises to stretch your chest muscles. If these treatments do not help, your health care provider may inject a numbingmedicine at the sternum-rib connection to help relieve the pain. Follow these instructions at home: Managing pain, stiffness, and swelling     If directed, put ice on the painful area. To do this: Put ice in a plastic bag. Place a towel between your skin and the bag. Leave the ice on for 20 minutes, 2-3 times a day. If directed, apply heat to the affected area as often as told by your health care provider. Use the heat source that your health care provider recommends, such as a moist heat pack or a heating pad. Place a towel between your skin and the heat source. Leave the heat on for 20-30 minutes. Remove the heat if your skin turns bright red. This is especially important if you are unable to feel pain, heat, or cold. You may have a greater risk of getting burned. Activity Rest as told by your health care provider. Avoid activities that make pain worse. This includes any activities that use chest, abdominal, and side muscles. Do not lift anything that is heavier than 10 lb (4.5 kg), or the limit that you are told, until your health care provider says that it is safe. Return to your normal activities as told by your health care provider. Ask your health care provider  what activities are safe for you. General instructions Take over-the-counter and prescription medicines only as told by your health care provider. Keep all follow-up visits as told by your health care provider. This is important. Contact a health care provider if: You have chills or a fever. Your pain does not go away or it gets worse. You have a cough that does not go away. Get help right away if: You have shortness of  breath. You have severe chest pain that is not relieved by medicines, heat, or ice. These symptoms may represent a serious problem that is an emergency. Do not wait to see if the symptoms will go away. Get medical help right away. Call your local emergency services (911 in the [place] Do not drive yourself to the hospital. Summary Costochondritis is inflammation of the tissue (cartilage) that connects the ribs to the breastbone (sternum). This condition causes pain in the front of the chest. Costochondritis results from stress on the cartilage where your ribs attach to your sternum. Treatment may include medicines, rest, heat or ice, and exercises. This information is not intended to replace advice given to you by your health care provider. Make sure you discuss any questions you have with your healthcare provider. Document Revised: 05/11/2019 Document Reviewed: 05/11/2019 Elsevier Patient Education  2022 [employer].

## 2021-03-24 DIAGNOSIS — H353132 Nonexudative age-related macular degeneration, bilateral, intermediate dry stage: Secondary | ICD-10-CM | POA: Diagnosis not present

## 2021-03-31 ENCOUNTER — Other Ambulatory Visit: Payer: Self-pay

## 2021-03-31 ENCOUNTER — Ambulatory Visit: Payer: PPO | Attending: Internal Medicine

## 2021-03-31 DIAGNOSIS — Z23 Encounter for immunization: Secondary | ICD-10-CM

## 2021-03-31 MED ORDER — PFIZER COVID-19 VAC BIVALENT 30 MCG/0.3ML IM SUSP
INTRAMUSCULAR | 0 refills | Status: DC
  Filled 2021-03-31: qty 0.3, 1d supply, fill #0

## 2021-03-31 NOTE — Progress Notes (Signed)
   Covid-19 Vaccination Clinic  Name:  ANISTEN TOMASSI    MRN: [id number] DOB: 03-Oct-1950  03/31/2021  Ms. Rayos was observed post Covid-19 immunization for 15 minutes without incident. She was provided with Vaccine Information Sheet and instruction to access the V-Safe system.   Ms. Deeley was instructed to call 911 with any severe reactions post vaccine: Difficulty breathing  Swelling of face and throat  A fast heartbeat  A bad rash all over body  Dizziness and weakness   Lu Duffel, PharmD, MBA Clinical Acute Care Pharmacist

## 2021-04-30 DIAGNOSIS — Z1231 Encounter for screening mammogram for malignant neoplasm of breast: Secondary | ICD-10-CM | POA: Diagnosis not present

## 2021-04-30 LAB — HM MAMMOGRAPHY

## 2021-05-08 ENCOUNTER — Encounter: Payer: Self-pay | Admitting: Family Medicine

## 2021-06-10 ENCOUNTER — Telehealth: Payer: Self-pay | Admitting: Family Medicine

## 2021-06-10 NOTE — Chronic Care Management (AMB) (Signed)
  Chronic Care Management   Outreach Note  06/10/2021 Name: IDALI LAFEVER MRN: [id number] DOB: [birth year]  Referred by: Ria Bush, MD Reason for referral : No chief complaint on file.   An unsuccessful telephone outreach was attempted today. The patient was referred to the pharmacist for assistance with care management and care coordination.   Follow Up Plan:   Tatjana Dellinger Upstream Scheduler

## 2021-06-10 NOTE — Chronic Care Management (AMB) (Signed)
  Chronic Care Management   Note  06/10/2021 Name: Yolanda Walsh MRN: [id number] DOB: 1950-08-01  Yolanda Walsh is a 70 y.o. year old female who is a primary care patient of Ria Bush, MD. I reached out to [name] by phone today in response to a referral sent by Ms. Yolanda Cory Leever's PCP, Ria Bush, MD.   [name] was given information about Chronic Care Management services today including:  CCM service includes personalized support from designated clinical staff supervised by her physician, including individualized plan of care and coordination with other care providers 24/7 contact phone numbers for assistance for urgent and routine care needs. Service will only be billed when office clinical staff spend 20 minutes or more in a month to coordinate care. Only one practitioner may furnish and bill the service in a calendar month. The patient may stop CCM services at any time (effective at the end of the month) by phone call to the office staff.   Patient agreed to services and verbal consent obtained.   Follow up plan:   Tatjana Secretary/administrator

## 2021-06-15 ENCOUNTER — Encounter: Payer: Self-pay | Admitting: Family Medicine

## 2021-06-15 ENCOUNTER — Telehealth: Payer: Self-pay

## 2021-06-15 NOTE — Progress Notes (Signed)
Chronic Care Management Pharmacy Assistant   Name: Yolanda Walsh  MRN: [id number] DOB: 1951/05/14  Reason for Encounter: CCM (Initial Questions)   Recent office visits:  01/19/2021 - Ria Bush, MD - Patient presented for hospital follow up with costochondritis.  Change: hydrochlorothiazide (HYDRODIURIL) 12.5 MG tablet - Take 0.5 table as needed vs. 0.5 daily.   Recent consult visits:  03/31/2021 - Lu Duffel, [place] Patient presented for Covid-19 Vaccination.  03/24/2021 - [facility] - Ophthalmology - Patient presented for Nonexudative age-related macular degeneration, bilateral, intermediate dry stage. Procedure: Ophthalmic imaging retina and eye exam.  03/23/2021 - [facility] - Audiology - Patient presented for sensorineural hearing loss, bilateral (Primary Dx); Left-sided tinnitus. Patient was fit with two [employer]ia [employer] Advanced RIC hearing aids and presented for related follow-up care. 03/02/2021 - [facility] - Audiology - Patient presented for sensorineural hearing loss, bilateral (Primary Dx); Left-sided tinnitus. No other information.  02/20/2021 - [facility] - Audiology - Patient presented for sensorineural hearing loss, bilateral (Primary Dx); Left-sided tinnitus. No other information.  01/16/2021 - [facility] - Audiology - Patient presented for sensorineural hearing loss, bilateral (Primary Dx); Left-sided tinnitus. Impression: Mnire's disease left ear. Follow up 1 year.   Hospital visits:  Medication Reconciliation was completed by comparing discharge summary, patient's EMR and Pharmacy list, and upon discussion with patient.  Admitted to the hospital on 01/09/2021 due to left-sided chest pain. Discharge date was 01/13/2021. Discharged from [facility].    Chest x-ray is unremarkable, blood work is reassuring, 2 negative troponins without a delta, D-dimer is negative.  Treating physician was very reassured with a low  suspicion for ACS/PE/dissection.    New?Medications Started at [facility] Discharge:?? None noted  Medication Changes at Hospital Discharge: None noted  Medications Discontinued at Hospital Discharge: None noted  Medications that remain the same after Hospital Discharge:??  -All other medications will remain the same.    Medications: Outpatient Encounter Medications as of 06/15/2021  Medication Sig   cetirizine (ZYRTEC) 10 MG tablet Take 10 mg by mouth daily.   Cholecalciferol (VITAMIN D3) 1000 UNITS CAPS Take 1 capsule (1,000 Units total) by mouth daily.   clobetasol cream (TEMOVATE) 0.05 % SMARTSIG:1 Topical Every Night   COVID-19 mRNA bivalent vaccine, Pfizer, (PFIZER COVID-19 VAC BIVALENT) injection Inject into the muscle.   hydrochlorothiazide (HYDRODIURIL) 12.5 MG tablet Take 0.5 tablets (6.25 mg total) by mouth daily as needed (meniere's).   ibuprofen (ADVIL,MOTRIN) 200 MG tablet Take 200 mg by mouth every 6 (six) hours as needed for mild pain or moderate pain.    meclizine (ANTIVERT) 25 MG tablet Take 1 tablet (25 mg total) by mouth 3 (three) times daily as needed for dizziness.   Multiple Vitamins-Minerals (PRESERVISION AREDS 2+MULTI VIT PO) Take 2 capsules by mouth daily.   ondansetron (ZOFRAN ODT) 4 MG disintegrating tablet Take 1 tablet (4 mg total) by mouth every 8 (eight) hours as needed.   rosuvastatin (CRESTOR) 5 MG tablet Take 1 tablet (5 mg total) by mouth every Monday, Wednesday, and Friday.   sertraline (ZOLOFT) 50 MG tablet Take 1.5 tablets (75 mg total) by mouth daily.   No facility-administered encounter medications on file as of 06/15/2021.   No results found for: HGBA1C, MICROALBUR   BP Readings from Last 3 Encounters:  01/19/21 112/70  01/09/21 (!) 148/77  06/23/20 116/66   Patient contacted to review initial questions prior to visit with Charlene Brooke.  Have you seen  any other providers since your last visit with PCP? Yes - Patient has two [employer]ia TH  Advanced RIC hearing aids as of 03/23/2021.  Any changes in your medications or health? Yes - Patient got hearing aids in September.   Any side effects from any medications? No  Do you have an symptoms or problems not managed by your medications? No  Any concerns about your health right now? No  Has your provider asked that you check blood pressure, blood sugar, or follow special diet at home? No  Do you get any type of exercise on a regular basis? Patient states she is active with house work and yard work.   Can you think of a goal you would like to reach for your health? Yes Patient states she would like to lose a few pounds. Patient states her daughter just moved back in town after 12 years so she is cooking home meals more. Patient is very excited about daughter being back.   Do you have any problems getting your medications? No  Is there anything that you would like to discuss during the appointment? No  Spoke with patient and reminded them to have all medications, supplements and any blood glucose and blood pressure readings available for review with pharmacist, at their telephone visit on 06/17/2021 at 11:00.   Star Rating Drugs:  Medication:  Last Fill: Day Supply Rosuvastatin 5 mg 04/24/2021 84   Care Gaps: Annual wellness visit in last year? Yes 06/18/2020 - PT scheduled for AWV on 06/23/2021 Most Recent BP reading: 112/70 on 01/19/2021  Appointment on 06/23/2021 with PCP for AWV.   Charlene Brooke, CPP notified  Marijean Niemann, [place] Clinical Pharmacy Assistant [phone]  Time Spent: 40 Minutes

## 2021-06-15 NOTE — Telephone Encounter (Signed)
Spoke with pt scheduling cpe lab visit on 06/18/21 at 2:45 at [facility].  Scheduled CPE on 06/24/21 at 9:30.

## 2021-06-17 ENCOUNTER — Ambulatory Visit: Payer: PPO | Admitting: Pharmacist

## 2021-06-17 ENCOUNTER — Other Ambulatory Visit: Payer: Self-pay

## 2021-06-17 DIAGNOSIS — M8580 Other specified disorders of bone density and structure, unspecified site: Secondary | ICD-10-CM

## 2021-06-17 DIAGNOSIS — E785 Hyperlipidemia, unspecified: Secondary | ICD-10-CM

## 2021-06-17 DIAGNOSIS — H8102 Meniere's disease, left ear: Secondary | ICD-10-CM

## 2021-06-17 NOTE — Progress Notes (Signed)
Chronic Care Management Pharmacy Note  06/17/2021 Name:  Yolanda Walsh MRN:  [id number] DOB:  1951-06-13  Summary: -Pt is due for repeat DEXA scan (last 05/2019 showed osteopenia with elevated FRAX score and recommended repeat 2 years)  Recommendations/Changes made from today's visit: -Recommend repeat DEXA scan - pt plans to discuss at PCP appt 12/14. Consider Fosamax or Prolia if results are unchanged or worsened from previous  Plan: -[facility] will call patient 3 months for general adherence -Pharmacist follow up televisit scheduled for 1 year    Subjective: Yolanda Walsh is an 70 y.o. year old female who is a primary patient of Ria Bush, MD.  The CCM team was consulted for assistance with disease management and care coordination needs.    Engaged with patient by telephone for initial visit in response to provider referral for pharmacy case management and/or care coordination services.   Consent to Services:  The patient was given the following information about Chronic Care Management services today, agreed to services, and gave verbal consent: 1. CCM service includes personalized support from designated clinical staff supervised by the primary care provider, including individualized plan of care and coordination with other care providers 2. 24/7 contact phone numbers for assistance for urgent and routine care needs. 3. Service will only be billed when office clinical staff spend 20 minutes or more in a month to coordinate care. 4. Only one practitioner may furnish and bill the service in a calendar month. 5.The patient may stop CCM services at any time (effective at the end of the month) by phone call to the office staff. 6. The patient will be responsible for cost sharing (co-pay) of up to 20% of the service fee (after annual deductible is met). Patient agreed to services and consent obtained.  Patient Care Team: Ria Bush, MD as PCP - General (Family  Medicine) Eulogio Bear, MD as Consulting Physician (Ophthalmology) Jodi Marble, MD as Consulting Physician (Otolaryngology) Charlton Haws, [facility] as Pharmacist (Pharmacist)   Recent office visits: 01/19/2021 - Ria Bush, MD - Patient presented for hospital follow up with costochondritis.  Change: hydrochlorothiazide (HYDRODIURIL) 12.5 MG tablet - Take 0.5 table as needed vs. 0.5 daily.   Recent consult visits: 03/24/2021 - [facility] - Ophthalmology - Patient presented for Nonexudative age-related macular degeneration, bilateral, intermediate dry stage. Procedure: Ophthalmic imaging retina and eye exam.  03/23/2021 - [facility] - Audiology - Patient presented for sensorineural hearing loss, bilateral (Primary Dx); Left-sided tinnitus. Patient was fit with two [employer] hearing aids and presented for related follow-up care.  01/16/2021 - [facility] - Audiology - Patient presented for sensorineural hearing loss, bilateral (Primary Dx); Left-sided tinnitus. Impression: Mnire's disease left ear. Follow up 1 year.   Hospital visits: Medication Reconciliation was completed by comparing discharge summary, patient's EMR and Pharmacy list, and upon discussion with patient.   Admitted to the ED on 01/09/2021 due to left-sided chest pain. Discharge date was 01/09/2021. Discharged from [facility].    -Chest x-ray is unremarkable, blood work is reassuring, 2 negative troponins without a delta, D-dimer is negative.  Treating physician was very reassured with a low suspicion for ACS/PE/dissection.     Medications that remain the same after Hospital Discharge:??  -All other medications will remain the same.     Objective:  Lab Results  Component Value Date   CREATININE 0.80 01/09/2021   BUN 12 01/09/2021   GFR 82.43 06/19/2020   GFRNONAA >  60 01/09/2021   GFRAA >60 09/18/2019   NA 139 01/09/2021   K 4.1 01/09/2021   CALCIUM 9.3 01/09/2021    CO2 23 01/09/2021   GLUCOSE 91 01/09/2021    Lab Results  Component Value Date/Time   GFR 82.43 06/19/2020 08:50 AM   GFR 65.55 03/01/2019 12:53 PM    Last diabetic Eye exam: No results found for: HMDIABEYEEXA  Last diabetic Foot exam: No results found for: HMDIABFOOTEX   Lab Results  Component Value Date   CHOL 192 06/19/2020   HDL 50.00 06/19/2020   LDLCALC 116 (H) 06/19/2020   LDLDIRECT 170.0 03/23/2012   TRIG 129.0 06/19/2020   CHOLHDL 4 06/19/2020    Hepatic Function Latest Ref Rng & Units 01/09/2021 06/19/2020 06/13/2019  Total Protein 6.5 - 8.1 g/dL 6.8 6.9 6.8  Albumin 3.5 - 5.0 g/dL 3.7 4.0 4.0  AST 15 - 41 U/L _0 ALT 0 - 44 U/L _1 Alk Phosphatase 38 - 126 U/L 66 65 64  Total Bilirubin 0.3 - 1.2 mg/dL 0.5 0.5 0.4  Bilirubin, Direct 0.0 - 0.2 mg/dL <0.1 - 0.1    Lab Results  Component Value Date/Time   TSH 3.11 06/19/2020 08:50 AM   TSH 2.46 03/01/2019 12:53 PM    CBC Latest Ref Rng & Units 01/09/2021 06/19/2020 06/02/2020  WBC 4.0 - 10.5 K/uL 7.5 6.7 15.5(H)  Hemoglobin 12.0 - 15.0 g/dL 11.8(L) 12.0 13.5  Hematocrit 36.0 - 46.0 % 36.4 37.3 41.9  Platelets 150 - 400 K/uL 281 225.0 396    Lab Results  Component Value Date/Time   VD25OH 36.88 06/19/2020 08:50 AM   VD25OH 23.14 (L) 06/02/2018 11:02 AM    Clinical ASCVD: No  The 10-year ASCVD risk score (Arnett DK, et al., 2019) is: 7.4%   Values used to calculate the score:     Age: 70 years     Sex: Female     Is Non-Hispanic African American: No     Diabetic: No     Tobacco smoker: No     Systolic Blood Pressure: 034 mmHg     Is BP treated: No     HDL Cholesterol: 50 mg/dL     Total Cholesterol: 192 mg/dL    Depression screen [facility] 2/9 06/18/2020 06/13/2019 06/02/2018  Decreased Interest 0 0 0  Down, Depressed, Hopeless 0 0 0  PHQ - 2 Score 0 0 0  Altered sleeping 0 0 0  Tired, decreased energy 0 0 0  Change in appetite 0 0 0  Feeling bad or failure about yourself  0 0 0  Trouble  concentrating 0 0 0  Moving slowly or fidgety/restless 0 0 0  Suicidal thoughts 0 0 0  PHQ-9 Score 0 0 0  Difficult doing work/chores Not difficult at all Not difficult at all Not difficult at all     Social History   Tobacco Use  Smoking Status Never  Smokeless Tobacco Never   BP Readings from Last 3 Encounters:  01/19/21 112/70  01/09/21 (!) 148/77  06/23/20 116/66   Pulse Readings from Last 3 Encounters:  01/19/21 70  01/09/21 (!) 55  06/23/20 78   Wt Readings from Last 3 Encounters:  01/19/21 187 lb 1 oz (84.9 kg)  01/09/21 185 lb (83.9 kg)  06/23/20 183 lb 1 oz (83 kg)   BMI Readings from Last 3 Encounters:  01/19/21 32.11 kg/m  01/09/21 31.76 kg/m  06/23/20 31.42 kg/m    Assessment/Interventions:  Review of patient past medical history, allergies, medications, health status, including review of consultants reports, laboratory and other test data, was performed as part of comprehensive evaluation and provision of chronic care management services.   SDOH:  (Social Determinants of Health) assessments and interventions performed: Yes  SDOH Screenings   Alcohol Screen: Low Risk    Last Alcohol Screening Score (AUDIT): 0  Depression (PHQ2-9): Low Risk    PHQ-2 Score: 0  Financial Resource Strain: Low Risk    Difficulty of Paying Living Expenses: Not hard at all  Food Insecurity: No Food Insecurity   Worried About Charity fundraiser in the Last Year: Never true   Ran Out of Food in the Last Year: Never true  Housing: Low Risk    Last Housing Risk Score: 0  Physical Activity: Insufficiently Active   Days of Exercise per Week: 3 days   Minutes of Exercise per Session: 30 min  Social Connections: Not on file  Stress: No Stress Concern Present   Feeling of Stress : Not at all  Tobacco Use: Low Risk    Smoking Tobacco Use: Never   Smokeless Tobacco Use: Never   Passive Exposure: Not on file  Transportation Needs: No Transportation Needs   Lack of  Transportation (Medical): No   Lack of Transportation (Non-Medical): No    CCM Care Plan  No Known Allergies  Medications Reviewed Today     Reviewed by Charlton Haws, [facility] (Pharmacist) on 06/17/21 at 1121  Med List Status: <None>   Medication Order Taking? Sig Documenting Provider Last Dose Status Informant  cetirizine (ZYRTEC) 10 MG tablet [id number] Yes Take 10 mg by mouth daily. [provider] Taking Active Self  Cholecalciferol (VITAMIN D3) 1000 UNITS CAPS [id number] Yes Take 1 capsule (1,000 Units total) by mouth daily. Ria Bush, MD Taking Active Self  hydrochlorothiazide (HYDRODIURIL) 12.5 MG tablet [id number] Yes Take 0.5 tablets (6.25 mg total) by mouth daily as needed (meniere's). Ria Bush, MD Taking Active   ibuprofen (ADVIL,MOTRIN) 200 MG tablet [id number] Yes Take 200 mg by mouth every 6 (six) hours as needed for mild pain or moderate pain.  [provider] Taking Active Self  meclizine (ANTIVERT) 25 MG tablet [id number] Yes Take 1 tablet (25 mg total) by mouth 3 (three) times daily as needed for dizziness. Maudie Flakes, MD Taking Active   Multiple Vitamins-Minerals (PRESERVISION AREDS 2+MULTI VIT PO) [id number] Yes Take 2 capsules by mouth daily. [provider] Taking Active Self  rosuvastatin (CRESTOR) 5 MG tablet [id number] Yes Take 1 tablet (5 mg total) by mouth every Monday, Wednesday, and Friday. Ria Bush, MD Taking Active   sertraline (ZOLOFT) 50 MG tablet [id number] Yes Take 1.5 tablets (75 mg total) by mouth daily.  Patient taking differently: Take 50 mg by mouth daily.   Ria Bush, MD Taking Active             Patient Active Problem List   Diagnosis Date Noted   Costochondritis, acute 01/19/2021   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   Meniere disease, left 09/18/2019   Bilateral cataracts 06/18/2019   Syncope 02/27/2019   Vitamin D deficiency 06/12/2018   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  degeneration), bilateral 06/07/2017   Advanced care planning/counseling discussion 05/27/2016   Obesity, Class I, BMI 30-34.9 05/27/2016   Tinnitus 01/29/2013   Healthcare maintenance 04/24/2012   Hyperlipidemia 04/24/2012   Osteopenia 01/17/2008   GAD (generalized anxiety disorder) 01/17/2008    Immunization History  Administered  Date(s) Administered   Influenza Split 04/18/2012   Influenza Whole 04/13/2008   Influenza, High Dose Seasonal PF 03/26/2020   Influenza,inj,Quad PF,6+ Mos 04/19/2013, 04/01/2014, 03/28/2015, 03/31/2016, 04/08/2017, 04/06/2018, 03/01/2019   PFIZER(Purple Top)SARS-COV-2 Vaccination 08/04/2019, 08/25/2019, 04/08/2020   Pfizer Covid-19 Vaccine Bivalent Booster 69yr & up 03/31/2021   Pneumococcal Conjugate-13 05/27/2016   Pneumococcal Polysaccharide-23 06/01/2017   Td 03/13/1999   Tdap 02/04/2011   Zoster Recombinat (Shingrix) 09/12/2017, 11/14/2017   Zoster, Live 05/23/2012    Conditions to be addressed/monitored:  Hyperlipidemia, Anxiety, Osteopenia, and Meniere's disease  Care Plan : CCalpella Updates made by FCharlton Haws RPH since 06/17/2021 12:00 AM     Problem: Hyperlipidemia, Anxiety, Osteopenia, and Meniere's disease      Long-Range Goal: Disease mgmt   Start Date: 06/17/2021  Expected End Date: 06/17/2022  This Visit's Progress: On track  Priority: High  Note:   Current Barriers:  Unable to independently monitor therapeutic efficacy  Pharmacist Clinical Goal(s):  Patient will achieve adherence to monitoring guidelines and medication adherence to achieve therapeutic efficacy through collaboration with PharmD and provider.   Interventions: 1:1 collaboration with GRia Bush MD regarding development and update of comprehensive plan of care as evidenced by provider attestation and co-signature Inter-disciplinary care team collaboration (see longitudinal plan of care) Comprehensive medication review performed;  medication list updated in electronic medical record  Hyperlipidemia: (LDL goal < 130) -Controlled - LDL 116, improved from baseline 182; she had myalgias with daily rosuvastatin, does well with 3x a week dosing -Current treatment: Rosuvastatin 5 mg MWF -Educated on Cholesterol goals; Benefits of statin for ASCVD risk reduction; -Recommended to continue current medication  Anxiety (Goal: managesymptoms) -Controlled - tpt reports she "had a good year"; she usually takes just 50 mg of sertraline (1 tablet) and will take extra half if she is stressed -Current treatment: Sertraline 50 mg - 1.5 tab daily -Medications previously tried/failed: diazepam -GAD7: not on file -Educated on Benefits of medication for symptom control; long-term action of sertraline - there is unlikely to be benefit from taking higher dose inconsistently; pt is fine with continuing 50 mg daily -Recommended to continue current medication  Osteopenia (Goal prevent fractures) -Not ideally controlled - pt reported limited exercise, she is taking vitamin D without calcium -Last DEXA Scan: 05/14/2019   T-Score femoral neck: -2.4  T-Score total hip: -1.9  T-Score lumbar spine: -1.8  10-year probability of major osteoporotic fracture: 20%  10-year probability of hip fracture: 4.2% -Patient is a candidate for pharmacologic treatment due to T-Score -1.0 to -2.5 and 10-year risk of hip fracture > 3% -Current treatment  Vitamin D 2000 IU daily -Recommend weight-bearing and muscle strengthening exercises for building and maintaining bone density. -Recommend repeat DEXA scan (last > 2 years ago); consider fosamax or Prolia if results are unchanged or worse  Meniere's disease (Goal: manage symptoms) -Controlled - pt reports minimal symptoms/flares currently; she is not checking BP at home but denies dizziness, lightheadedness; she requests 90-day supply of HCTZ -Current treatment  HCTZ 12.5 mg - 1/2 tab daily PRN - takes  daily -Recommended to continue current medication  Health Maintenance -Vaccine gaps: Flu, TDAP -Pt reports she had flu shot in September and will bring documentation to upcoming PCP visit -Current therapy:  Cetirizine 10 mg daily PM  Ibuprofen 200 mg PRN - rare use Meclizine 25 mg PRN - twice a year Preservision Areds 2 BID Vitamin C -Patient is satisfied with current therapy and denies issues -  Recommended to continue current medication  Patient Goals/Self-Care Activities Patient will:  - take medications as prescribed as evidenced by patient report and record review focus on medication adherence by pill box check blood pressure periodically, document, and provide at future appointments target a minimum of 150 minutes of moderate intensity exercise weekly      Medication Assistance: None required.  Patient affirms current coverage meets needs.  Compliance/Adherence/Medication fill history: Care Gaps: None  Star-Rating Drugs: Rosuvastatin - LF 04/24/21 x 84 ds; [place] 94%  Patient's preferred pharmacy is:  [place], [place] [address] [place] [address] Phone: [phone] Fax: [phone]  Uses pill box? Yes Pt endorses 100% compliance  We discussed: Current pharmacy is preferred with insurance plan and patient is satisfied with pharmacy services Patient decided to: Continue current medication management strategy  Care Plan and Follow Up Patient Decision:  Patient agrees to Care Plan and Follow-up.  Plan: Telephone follow up appointment with care management team member scheduled for:  1 year  Charlene Brooke, PharmD, [facility] Clinical Pharmacist [facility] [phone]

## 2021-06-18 ENCOUNTER — Other Ambulatory Visit (INDEPENDENT_AMBULATORY_CARE_PROVIDER_SITE_OTHER): Payer: PPO

## 2021-06-18 ENCOUNTER — Other Ambulatory Visit: Payer: Self-pay | Admitting: Family Medicine

## 2021-06-18 DIAGNOSIS — E785 Hyperlipidemia, unspecified: Secondary | ICD-10-CM

## 2021-06-18 DIAGNOSIS — D649 Anemia, unspecified: Secondary | ICD-10-CM

## 2021-06-18 DIAGNOSIS — E559 Vitamin D deficiency, unspecified: Secondary | ICD-10-CM

## 2021-06-19 LAB — LIPID PANEL
Cholesterol: 197 mg/dL (ref 0–200)
HDL: 55.3 mg/dL (ref >39.00)
LDL Cholesterol: 118 mg/dL — ABNORMAL HIGH (ref 0–99)
NonHDL: 141.87
Total CHOL/HDL Ratio: 4
Triglycerides: 121 mg/dL (ref 0.0–149.0)
VLDL: 24.2 mg/dL (ref 0.0–40.0)

## 2021-06-19 LAB — CBC WITH DIFFERENTIAL/PLATELET
Basophils Absolute: 0 10*3/uL (ref 0.0–0.1)
Basophils Relative: 0.5 % (ref 0.0–3.0)
Eosinophils Absolute: 0.1 10*3/uL (ref 0.0–0.7)
Eosinophils Relative: 0.7 % (ref 0.0–5.0)
HCT: 36.3 % (ref 36.0–46.0)
Hemoglobin: 11.7 g/dL — ABNORMAL LOW (ref 12.0–15.0)
Lymphocytes Relative: 24.9 % (ref 12.0–46.0)
Lymphs Abs: 1.7 10*3/uL (ref 0.7–4.0)
MCHC: 32.3 g/dL (ref 30.0–36.0)
MCV: 83.8 fl (ref 78.0–100.0)
Monocytes Absolute: 0.5 10*3/uL (ref 0.1–1.0)
Monocytes Relative: 7.2 % (ref 3.0–12.0)
Neutro Abs: 4.6 10*3/uL (ref 1.4–7.7)
Neutrophils Relative %: 66.7 % (ref 43.0–77.0)
Platelets: 249 10*3/uL (ref 150.0–400.0)
RBC: 4.33 Mil/uL (ref 3.87–5.11)
RDW: 14.3 % (ref 11.5–15.5)
WBC: 7 10*3/uL (ref 4.0–10.5)

## 2021-06-19 LAB — COMPREHENSIVE METABOLIC PANEL
ALT: 13 U/L (ref 0–35)
AST: 19 U/L (ref 0–37)
Albumin: 4.1 g/dL (ref 3.5–5.2)
Alkaline Phosphatase: 72 U/L (ref 39–117)
BUN: 16 mg/dL (ref 6–23)
CO2: 27 mEq/L (ref 19–32)
Calcium: 9.6 mg/dL (ref 8.4–10.5)
Chloride: 102 mEq/L (ref 96–112)
Creatinine, Ser: 0.8 mg/dL (ref 0.40–1.20)
GFR: 74.54 mL/min (ref >60.00)
Glucose, Bld: 94 mg/dL (ref 70–99)
Potassium: 4.3 mEq/L (ref 3.5–5.1)
Sodium: 138 mEq/L (ref 135–145)
Total Bilirubin: 0.6 mg/dL (ref 0.2–1.2)
Total Protein: 6.9 g/dL (ref 6.0–8.3)

## 2021-06-19 LAB — VITAMIN D 25 HYDROXY (VIT D DEFICIENCY, FRACTURES): VITD: 40.67 ng/mL (ref 30.00–100.00)

## 2021-06-19 IMAGING — CT CT ANGIO CHEST-ABD-PELV FOR DISSECTION W/ AND WO/W CM
2 of 7 series · 11 of 36 positions shown, 15 images · non-contrast
Comparison: None.

CLINICAL DATA: Abdominal pain.  Concern for aortic dissection.

EXAM:
CT ANGIOGRAPHY CHEST, ABDOMEN AND PELVIS
TECHNIQUE: Non-contrast CT of the chest was initially obtained.

[Series 8: dissection 2mm · axial · 0.67mm/px · z∈[-142,+438]mm · 10 of 332 slices shown, 13 images]
[im 21/332  mediastinal]
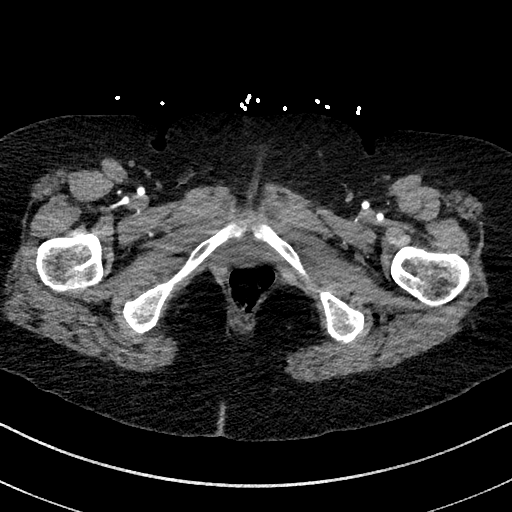
[im 21/332  bone]
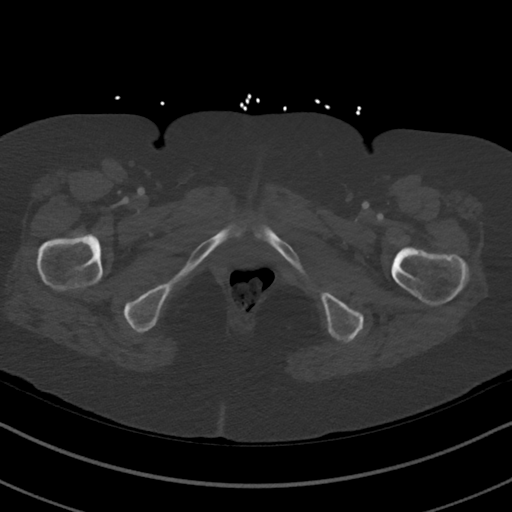
[im 63/332  mediastinal]
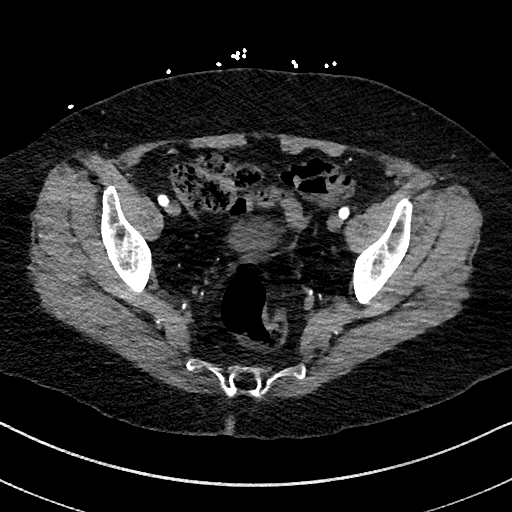
[im 104/332  mediastinal]
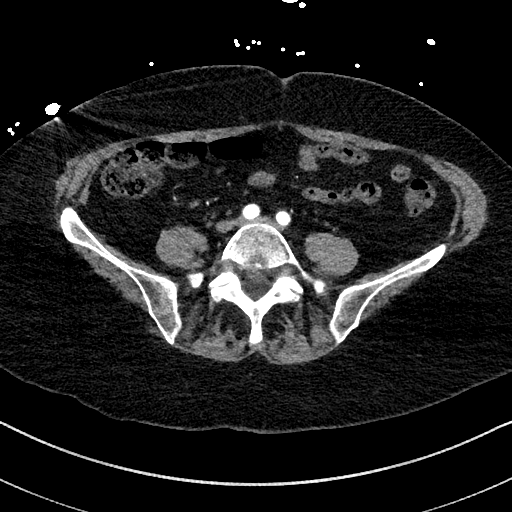
[im 145/332  mediastinal]
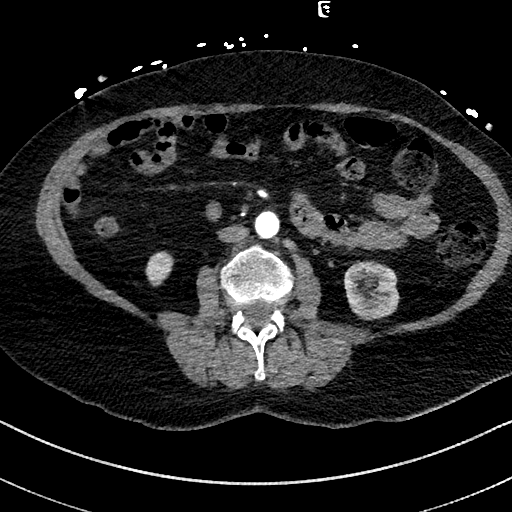
[im 187/332  mediastinal]
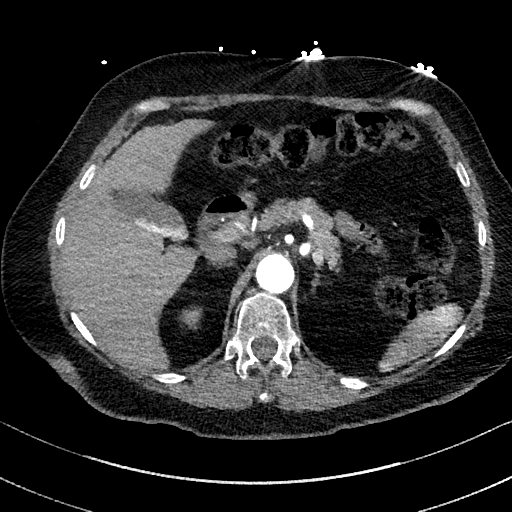
[im 228/332  mediastinal]
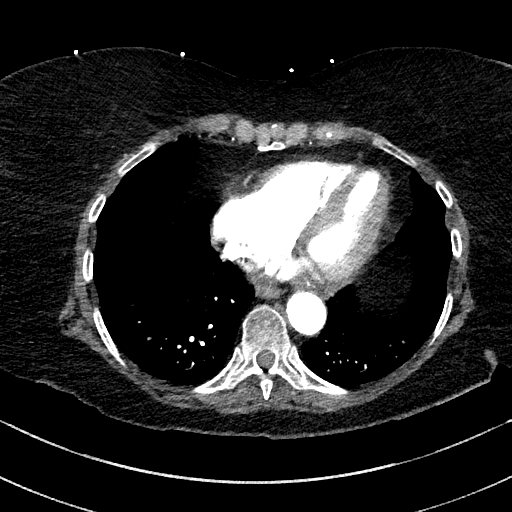
[im 249/332  lung]
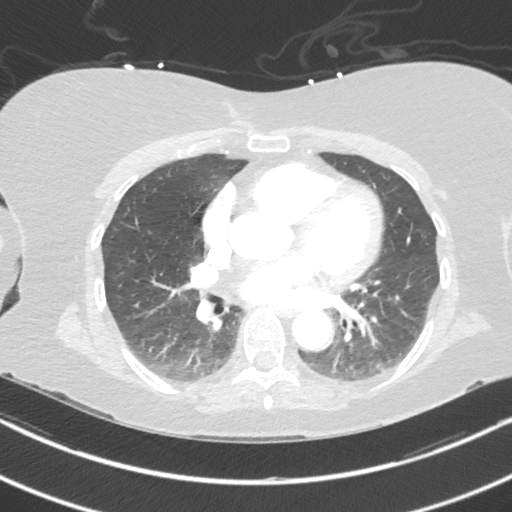
[im 269/332  mediastinal]
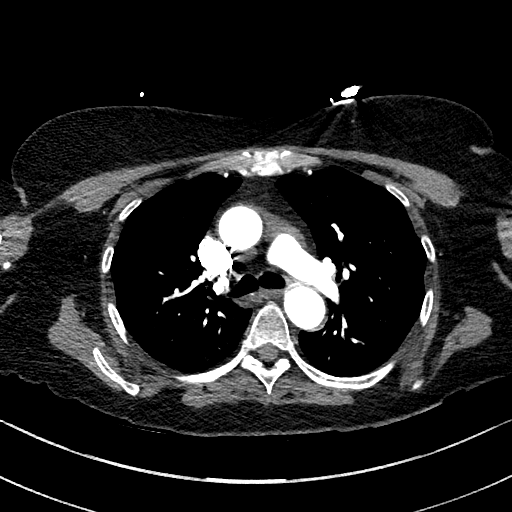
[im 269/332  lung]
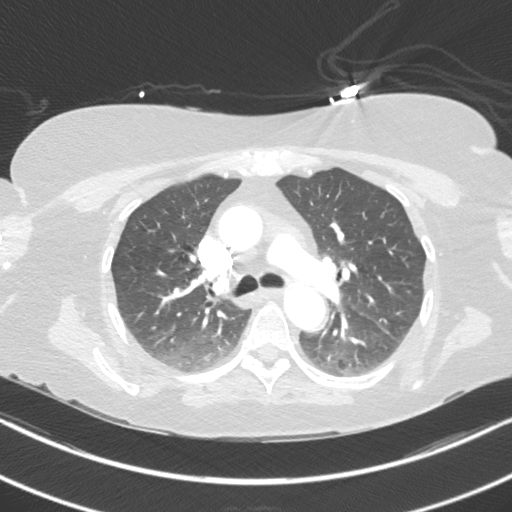
[im 290/332  lung]
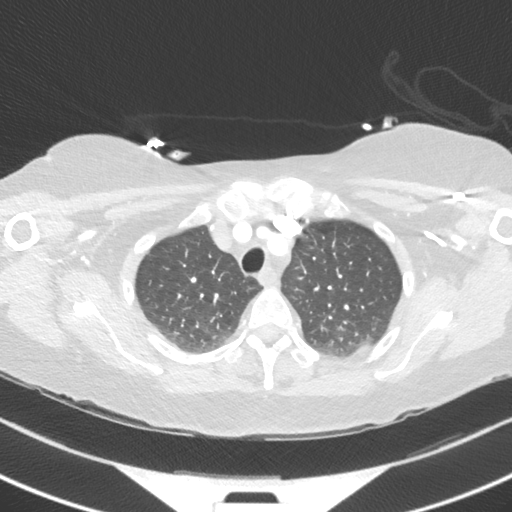
[im 311/332  mediastinal]
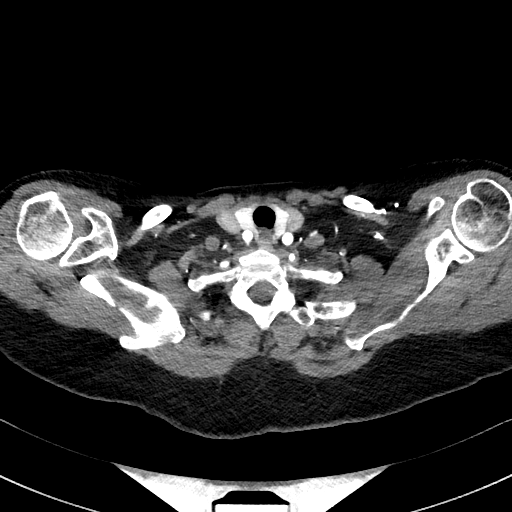
[im 311/332  lung]
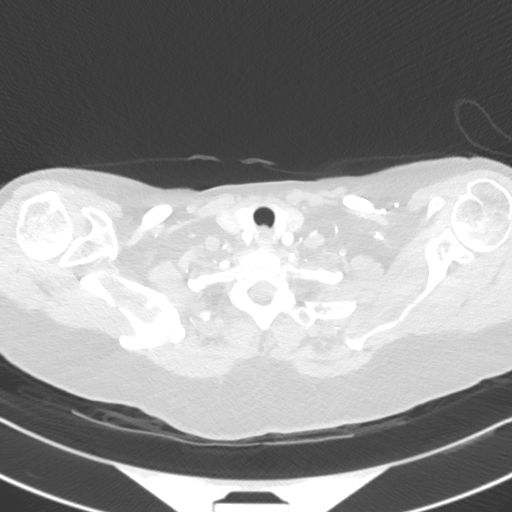

[Series 11: dissection 2mm cor · coronal · 0.80mm/px · 1 of 139 slices shown, 2 images]
[im 70/139  mediastinal]
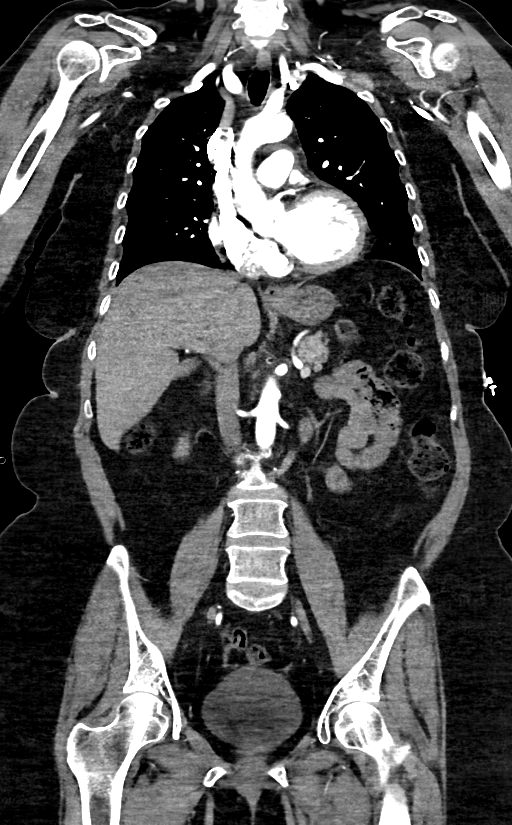
[im 70/139  bone]
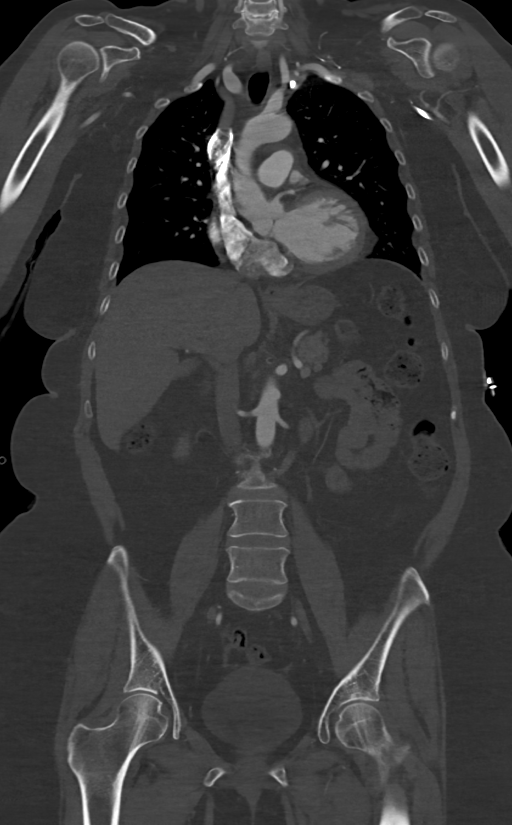

[11 of 36 positions shown; findings below may reference images not displayed]

Multidetector CT imaging through the chest, abdomen and pelvis was
performed using the standard protocol during bolus administration of
intravenous contrast. Multiplanar reconstructed images and MIPs were
obtained and reviewed to evaluate the vascular anatomy.

CONTRAST:  100mL OMNIPAQUE IOHEXOL 350 MG/ML SOLN
FINDINGS: CTA CHEST FINDINGS

Cardiovascular: There is no evidence for thoracic aortic aneurysm or
dissection. The heart size is mildly enlarged. Subtle coronary
artery calcifications are noted. There is no large centrally located
pulmonary embolism. The arch vessels are grossly patent where
visualized.

Mediastinum/Nodes:

-- No mediastinal lymphadenopathy.

-- No hilar lymphadenopathy.

-- No axillary lymphadenopathy.

-- No supraclavicular lymphadenopathy.

-- Normal thyroid gland where visualized.

-  Unremarkable esophagus.

Lungs/Pleura: Airways are patent. No pleural effusion, lobar
consolidation, pneumothorax or pulmonary infarction.

Musculoskeletal: No chest wall abnormality. No bony spinal canal
stenosis.

Review of the MIP images confirms the above findings.

CTA ABDOMEN AND PELVIS FINDINGS

VASCULAR

Aorta: Normal caliber aorta without aneurysm, dissection, vasculitis
or significant stenosis.

Celiac: Patent without evidence of aneurysm, dissection, vasculitis
or significant stenosis.

SMA: Patent without evidence of aneurysm, dissection, vasculitis or
significant stenosis.

Renals: Both renal arteries are patent without evidence of aneurysm,
dissection, vasculitis, fibromuscular dysplasia or significant
stenosis.

IMA: Patent without evidence of aneurysm, dissection, vasculitis or
significant stenosis.

Inflow: Patent without evidence of aneurysm, dissection, vasculitis
or significant stenosis.

Veins: No obvious venous abnormality within the limitations of this
arterial phase study.

Review of the MIP images confirms the above findings.

NON-VASCULAR

Hepatobiliary: The liver is normal. Cholelithiasis without acute
inflammation.There is no biliary ductal dilation.

Pancreas: Normal contours without ductal dilatation. No
peripancreatic fluid collection.

Spleen: Unremarkable.

Adrenals/Urinary Tract:

--Adrenal glands: Unremarkable.

--Right kidney/ureter: No hydronephrosis or radiopaque kidney
stones.

--Left kidney/ureter: Peripelvic cysts are noted on the left.

--Urinary bladder: Unremarkable.

Stomach/Bowel:

--Stomach/Duodenum: No hiatal hernia or other gastric abnormality.
Normal duodenal course and caliber.

--Small bowel: Unremarkable.

--Colon: Unremarkable.

--Appendix: Normal.

Lymphatic:

--No retroperitoneal lymphadenopathy.

--No mesenteric lymphadenopathy.

--No pelvic or inguinal lymphadenopathy.

Reproductive: Status post hysterectomy. No adnexal mass.

Other: No ascites or free air. There is a small fat containing
umbilical hernia.

Musculoskeletal. No acute displaced fractures.

Review of the MIP images confirms the above findings.
IMPRESSION: 1. No acute thoracic, abdominal or pelvic pathology. Specifically,
there is no evidence for aortic aneurysm or dissection.
2. Cholelithiasis without acute inflammation.
3. Small fat containing umbilical hernia.

## 2021-06-21 NOTE — Patient Instructions (Signed)
Visit Information  Phone number for Pharmacist: [phone]  Thank you for meeting with me to discuss your medications! I look forward to working with you to achieve your health care goals. Below is a summary of what we talked about during the visit:   Goals Addressed             This Visit's Progress    Prevent Falls and Broken Bones-Osteoporosis       Timeframe:  Long-Range Goal Priority:  High Start Date:       06/17/21                      Expected End Date: 06/17/22                       Follow Up Date Dec 2023   - always use handrails on the stairs - always wear shoes or slippers with non-slip sole - get at least 10 minutes of activity every day - keep cell phone with me always -Increasing weight-baring activity (walking) to improve bone density    Why is this important?   When you fall, there are 3 things that control if a bone breaks or not.  These are the fall itself, how hard and the direction that you fall and how fragile your bones are.  Preventing falls is very important for you because of fragile bones.     Notes:         Patient Care Plan: CCM Pharmacy Care Plan     Problem Identified: Hyperlipidemia, Anxiety, Osteopenia, and Meniere's disease      Long-Range Goal: Disease mgmt   Start Date: 06/17/2021  Expected End Date: 06/17/2022  This Visit's Progress: On track  Priority: High  Note:   Current Barriers:  Unable to independently monitor therapeutic efficacy  Pharmacist Clinical Goal(s):  Patient will achieve adherence to monitoring guidelines and medication adherence to achieve therapeutic efficacy through collaboration with PharmD and provider.   Interventions: 1:1 collaboration with Ria Bush, MD regarding development and update of comprehensive plan of care as evidenced by provider attestation and co-signature Inter-disciplinary care team collaboration (see longitudinal plan of care) Comprehensive medication review performed;  medication list updated in electronic medical record  Hyperlipidemia: (LDL goal < 130) -Controlled - LDL 116, improved from baseline 182; she had myalgias with daily rosuvastatin, does well with 3x a week dosing -Current treatment: Rosuvastatin 5 mg MWF -Educated on Cholesterol goals; Benefits of statin for ASCVD risk reduction; -Recommended to continue current medication  Anxiety (Goal: managesymptoms) -Controlled - tpt reports she "had a good year"; she usually takes just 50 mg of sertraline (1 tablet) and will take extra half if she is stressed -Current treatment: Sertraline 50 mg - 1.5 tab daily -Medications previously tried/failed: diazepam -GAD7: not on file -Educated on Benefits of medication for symptom control; long-term action of sertraline - there is unlikely to be benefit from taking higher dose inconsistently; pt is fine with continuing 50 mg daily -Recommended to continue current medication  Osteopenia (Goal prevent fractures) -Not ideally controlled - pt reported limited exercise, she is taking vitamin D without calcium -Last DEXA Scan: 05/14/2019   T-Score femoral neck: -2.4  T-Score total hip: -1.9  T-Score lumbar spine: -1.8  10-year probability of major osteoporotic fracture: 20%  10-year probability of hip fracture: 4.2% -Patient is a candidate for pharmacologic treatment due to T-Score -1.0 to -2.5 and 10-year risk of hip fracture > 3% -Current  treatment  Vitamin D 2000 IU daily -Recommend weight-bearing and muscle strengthening exercises for building and maintaining bone density. -Recommend repeat DEXA scan (last > 2 years ago); consider fosamax or Prolia if results are unchanged or worse  Meniere's disease (Goal: manage symptoms) -Controlled - pt reports minimal symptoms/flares currently; she is not checking BP at home but denies dizziness, lightheadedness; she requests 90-day supply of HCTZ -Current treatment  HCTZ 12.5 mg - 1/2 tab daily PRN - takes  daily -Recommended to continue current medication  Health Maintenance -Vaccine gaps: Flu, TDAP -Pt reports she had flu shot in September and will bring documentation to upcoming PCP visit -Current therapy:  Cetirizine 10 mg daily PM  Ibuprofen 200 mg PRN - rare use Meclizine 25 mg PRN - twice a year Preservision Areds 2 BID Vitamin C -Patient is satisfied with current therapy and denies issues -Recommended to continue current medication  Patient Goals/Self-Care Activities Patient will:  - take medications as prescribed as evidenced by patient report and record review focus on medication adherence by pill box check blood pressure periodically, document, and provide at future appointments target a minimum of 150 minutes of moderate intensity exercise weekly     Ms. Porcher was given information about Chronic Care Management services today including:  CCM service includes personalized support from designated clinical staff supervised by her physician, including individualized plan of care and coordination with other care providers 24/7 contact phone numbers for assistance for urgent and routine care needs. Standard insurance, coinsurance, copays and deductibles apply for chronic care management only during months in which we provide at least 20 minutes of these services. Most insurances cover these services at 100%, however patients may be responsible for any copay, coinsurance and/or deductible if applicable. This service may help you avoid the need for more expensive face-to-face services. Only one practitioner may furnish and bill the service in a calendar month. The patient may stop CCM services at any time (effective at the end of the month) by phone call to the office staff.  Patient agreed to services and verbal consent obtained.   Patient verbalizes understanding of instructions provided today and agrees to view in [place].  Telephone follow up appointment with pharmacy team  member scheduled for: 1 year  Charlene Brooke, PharmD, [facility] Clinical Pharmacist [facility] [phone]

## 2021-06-22 NOTE — Progress Notes (Signed)
Subjective:   Yolanda Walsh is a 70 y.o. female who presents for Medicare Annual (Subsequent) preventive examination.  I connected with Ovidio Kin today by telephone and verified that I am speaking with the correct person using two identifiers. Location patient: home Location provider: work Persons participating in the virtual visit: patient, Marine scientist.    I discussed the limitations, risks, security and privacy concerns of performing an evaluation and management service by telephone and the availability of in person appointments. I also discussed with the patient that there may be a patient responsible charge related to this service. The patient expressed understanding and verbally consented to this telephonic visit.    Interactive audio and video telecommunications were attempted between this provider and patient, however failed, due to patient having technical difficulties OR patient did not have access to video capability.  We continued and completed visit with audio only.  Some vital signs may be absent or patient reported.   Time Spent with patient on telephone encounter: 25 minutes  Review of Systems     Cardiac Risk Factors include: advanced age (>49men, >29 women);dyslipidemia;hypertension     Objective:    Today's Vitals   06/23/21 0859  Weight: 189 lb (85.7 kg)  Height: 5\' 4"  (1.626 m)   Body mass index is 32.44 kg/m.  Advanced Directives 06/23/2021 01/09/2021 06/18/2020 06/02/2020 08/01/2019 07/04/2019 06/13/2019  Does Patient Have a Medical Advance Directive? Yes Yes Yes No Yes Yes Yes  Type of Paramedic of [place];Living will [place];Living will [place];Living will - [place];Living will Living will;Healthcare Power of [place];Living will  Does patient want to make changes to medical advance directive? Yes (MAU/Ambulatory/Procedural Areas - Information  given) No - Patient declined - - No - Patient declined - -  Copy of [place] in Chart? - - No - copy requested - No - copy requested Yes - validated most recent copy scanned in chart (See row information) No - copy requested  Would patient like information on creating a medical advance directive? - - - No - Patient declined - - -    Current Medications (verified) Outpatient Encounter Medications as of 06/23/2021  Medication Sig   cetirizine (ZYRTEC) 10 MG tablet Take 10 mg by mouth daily.   Cholecalciferol (VITAMIN D3) 1000 UNITS CAPS Take 1 capsule (1,000 Units total) by mouth daily.   hydrochlorothiazide (HYDRODIURIL) 12.5 MG tablet Take 0.5 tablets (6.25 mg total) by mouth daily as needed (meniere's).   ibuprofen (ADVIL,MOTRIN) 200 MG tablet Take 200 mg by mouth every 6 (six) hours as needed for mild pain or moderate pain.    meclizine (ANTIVERT) 25 MG tablet Take 1 tablet (25 mg total) by mouth 3 (three) times daily as needed for dizziness.   Multiple Vitamins-Minerals (PRESERVISION AREDS 2+MULTI VIT PO) Take 2 capsules by mouth daily.   rosuvastatin (CRESTOR) 5 MG tablet Take 1 tablet (5 mg total) by mouth every Monday, Wednesday, and Friday.   sertraline (ZOLOFT) 50 MG tablet Take 1.5 tablets (75 mg total) by mouth daily. (Patient taking differently: Take 50 mg by mouth daily.)   No facility-administered encounter medications on file as of 06/23/2021.    Allergies (verified) Patient has no known allergies.   History: Past Medical History:  Diagnosis Date   Ankle fracture    right   Depression    History of anal fissures 2013   worsened by constipation  History of hemorrhoids    w/o complications   History of rectal fissure    Hyperlipidemia 32/99/2426   Lichen sclerosus    temovate cream   Medial meniscus tear    Osteopenia    dexa 2013, 2016   Trigger finger    Vertigo    Past Surgical History:  Procedure Laterality Date   CATARACT EXTRACTION  W/PHACO Left 07/04/2019   Procedure: CATARACT EXTRACTION PHACO AND INTRAOCULAR LENS PLACEMENT (IOC) LEFT  6.67  00:44.0  15.2%;  Surgeon: Leandrew Koyanagi, MD;  Location: [place];  Service: Ophthalmology;  Laterality: Left;   CATARACT EXTRACTION W/PHACO Right 08/01/2019   Procedure: CATARACT EXTRACTION PHACO AND INTRAOCULAR LENS PLACEMENT (IOC) RIGHT 5.33  00:45.4  11.8%;  Surgeon: Leandrew Koyanagi, MD;  Location: [place];  Service: Ophthalmology;  Laterality: Right;   COLONOSCOPY  09/2003   COLONOSCOPY  07/2016   TA, diverticulosis, rpt 5 yrs Henrene Pastor)   dexa  04/2012   T -2.1 hip, -1.2 spine   dexa  03/2015   T -2.4 hip, -1.7 spine   I & D EXTREMITY Right 03/16/2014   cat bite hand s/p I&D in OR ([name])   left hand surgery  1962   tendon laceration   PARTIAL HYSTERECTOMY  1980   for prolapse, ovaries remain   Family History  Problem Relation Age of Onset   Cancer Mother        leukemia   Coronary artery disease Mother 62   Coronary artery disease Father 7       MI   Parkinsonism Brother 83       deceased   Stroke Brother    CAD Brother 77       7 stents   Hypertension Brother    Hypertension Sister    Crohn's disease Sister    Diabetes Maternal Grandfather    CAD Other 58       stent   Social History   Socioeconomic History   Marital status: Widowed    Spouse name: Not on file   Number of children: 2   Years of education: 12   Highest education level: High school graduate  Occupational History   Occupation: Retired  Tobacco Use   Smoking status: Never   Smokeless tobacco: Never  Vaping Use   Vaping Use: Never used  Substance and Sexual Activity   Alcohol use: Not Currently   Drug use: No   Sexual activity: Not on file  Other Topics Concern   Not on file  Social History Narrative   Lives with cat    Widow, husband deceased from [place]. Brother died from PD. Sister died 2019-12-28   Daughter lives in [place]. Mother of Nigel Sloop.     Occupation: retired, prior worked in Psychologist, educational    Activity: walking daily at least 30 min    Diet: good water, fruits/vegetables daily - started eating daily harvest plant based soups and smoothies    Right-handed.   No daily use of caffeine.   Social Determinants of Health   Financial Resource Strain: Low Risk    Difficulty of Paying Living Expenses: Not hard at all  Food Insecurity: No Food Insecurity   Worried About Charity fundraiser in the Last Year: Never true   [place] in the Last Year: Never true  Transportation Needs: No Transportation Needs   Lack of Transportation (Medical): No   Lack of Transportation (Non-Medical): No  Physical Activity: Insufficiently Active  Days of Exercise per Week: 2 days   Minutes of Exercise per Session: 30 min  Stress: No Stress Concern Present   Feeling of Stress : Not at all  Social Connections: Socially Isolated   Frequency of Communication with Friends and Family: More than three times a week   Frequency of Social Gatherings with Friends and Family: More than three times a week   Attends Religious Services: Never   Marine scientist or Organizations: No   Attends Archivist Meetings: Never   Marital Status: Widowed    Tobacco Counseling Counseling given: Not Answered   Clinical Intake:  Pre-visit preparation completed: Yes  Pain : No/denies pain     BMI - recorded: 32.44 Nutritional Status: BMI > 30  Obese Nutritional Risks: None Diabetes: No  How often do you need to have someone help you when you read instructions, pamphlets, or other written materials from your doctor or pharmacy?: 1 - Never  Diabetic? No  Interpreter Needed?: No  Information entered by :: Orrin Brigham LPN   Activities of Daily Living In your present state of health, do you have any difficulty performing the following activities: 06/23/2021  Hearing? Y  Comment wears hearing aids  Vision? N  Difficulty  concentrating or making decisions? N  Walking or climbing stairs? N  Dressing or bathing? N  Doing errands, shopping? N  Preparing Food and eating ? N  Using the Toilet? N  In the past six months, have you accidently leaked urine? N  Do you have problems with loss of bowel control? N  Managing your Medications? N  Managing your Finances? N  Housekeeping or managing your Housekeeping? N  Some recent data might be hidden    Patient Care Team: Ria Bush, MD as PCP - General (Family Medicine) Eulogio Bear, MD as Consulting Physician (Ophthalmology) Jodi Marble, MD as Consulting Physician (Otolaryngology) Charlton Haws, [facility] as Pharmacist (Pharmacist)  Indicate any recent Medical Services you may have received from other than [facility] providers in the past year (date may be approximate).     Assessment:   This is a routine wellness examination for [place].  Hearing/Vision screen Hearing Screening - Comments:: Wears hearing aids Vision Screening - Comments:: Last exam 02/2021 Dr. Bronson Curb  Dietary issues and exercise activities discussed: Current Exercise Habits: Home exercise routine, Type of exercise: walking, Time (Minutes): 30, Frequency (Times/Week): 2, Weekly Exercise (Minutes/Week): 60, Intensity: Mild   Goals Addressed             This Visit's Progress    Patient Stated       Would like to get back to walking routine       Depression Screen PHQ 2/9 Scores 06/23/2021 06/18/2020 06/13/2019 06/02/2018 06/01/2017 05/27/2016  PHQ - 2 Score 0 0 0 0 1 0  PHQ- 9 Score - 0 0 0 1 -    Fall Risk Fall Risk  06/23/2021 06/18/2020 06/13/2019 06/06/2019 06/02/2018  Falls in the past year? 0 1 0 0 0  Comment - fell in shower - Emmi Telephone Survey: data to providers prior to load -  Number falls in past yr: 0 0 0 - -  Injury with Fall? 0 0 0 - -  Comment - - - - -  Risk for fall due to : No Fall Risks Medication side effect - - -  Follow up Falls prevention  discussed Falls evaluation completed;Falls prevention discussed Falls evaluation completed;Falls prevention discussed - -  FALL RISK PREVENTION PERTAINING TO THE HOME:  Any stairs in or around the home? Yes  If so, are there any without handrails? No  Home free of loose throw rugs in walkways, pet beds, electrical cords, etc? Yes  Adequate lighting in your home to reduce risk of falls? Yes   ASSISTIVE DEVICES UTILIZED TO PREVENT FALLS:  Life alert? No  Use of a cane, walker or w/c? No  Grab bars in the bathroom? Yes  Shower chair or bench in shower? Yes  Elevated toilet seat or a handicapped toilet? Yes   TIMED UP AND GO:  Was the test performed? No , visit completed over the phone.   Cognitive Function: Normal cognitive status assessed by this Nurse Health Advisor. No abnormalities found.   MMSE - Mini Mental State Exam 06/18/2020 06/13/2019 06/02/2018 06/01/2017  Orientation to time 5 5 5 5   Orientation to Place 5 5 5 5   Registration 3 3 3 3   Attention/ Calculation 5 5 0 0  Recall 3 3 3 3   Language- name 2 objects - - 0 0  Language- repeat 1 1 1 1   Language- follow 3 step command - - 3 3  Language- read & follow direction - - 0 0  Write a sentence - - 0 0  Copy design - - 0 0  Total score - - 20 20        Immunizations Immunization History  Administered Date(s) Administered   Influenza Split 04/18/2012   Influenza Whole 04/13/2008   Influenza, High Dose Seasonal PF 03/26/2020   Influenza,inj,Quad PF,6+ Mos 04/19/2013, 04/01/2014, 03/28/2015, 03/31/2016, 04/08/2017, 04/06/2018, 03/01/2019   Influenza-Unspecified 03/26/2021   PFIZER(Purple Top)SARS-COV-2 Vaccination 08/04/2019, 08/25/2019, 04/08/2020   Pfizer Covid-19 Vaccine Bivalent Booster 2yrs & up 03/31/2021   Pneumococcal Conjugate-13 05/27/2016   Pneumococcal Polysaccharide-23 06/01/2017   Td 03/13/1999   Tdap 02/04/2011   Zoster Recombinat (Shingrix) 09/12/2017, 11/14/2017   Zoster, Live 05/23/2012     TDAP status: Due, Education has been provided regarding the importance of this vaccine. Advised may receive this vaccine at local pharmacy or Health Dept. Aware to provide a copy of the vaccination record if obtained from local pharmacy or Health Dept. Verbalized acceptance and understanding.  Flu Vaccine status: Up to date  Pneumococcal vaccine status: Up to date  Covid-19 vaccine status: Completed vaccines  Qualifies for Shingles Vaccine? Yes   Zostavax completed Yes   Shingrix Completed?: Yes  Screening Tests Health Maintenance  Topic Date Due   TETANUS/TDAP  02/03/2021   COLONOSCOPY (Pts 45-44yrs Insurance coverage will need to be confirmed)  07/26/2021   MAMMOGRAM  04/30/2022   Pneumonia Vaccine 32+ Years old  Completed   INFLUENZA VACCINE  Completed   DEXA SCAN  Completed   COVID-19 Vaccine  Completed   Hepatitis C Screening  Completed   Zoster Vaccines- Shingrix  Completed   HPV VACCINES  Aged Out    Health Maintenance  Health Maintenance Due  Topic Date Due   TETANUS/TDAP  02/03/2021    Colorectal cancer screening: Type of screening: Colonoscopy. Completed 07/26/16. Repeat every 5 years  Mammogram status: Completed 04/30/21. Repeat every year  Bone Density status: due, last completed 05/14/19, patient plans on scheduling an appointment  Lung Cancer Screening: (Low Dose CT Chest recommended if Age 28-80 years, 30 pack-year currently smoking OR have quit w/in 15years.) does not qualify.     Additional Screening:  Hepatitis C Screening: does qualify; Completed 05/14/15  Vision Screening: Recommended annual ophthalmology  exams for early detection of glaucoma and other disorders of the eye. Is the patient up to date with their annual eye exam?  Yes  Who is the provider or what is the name of the office in which the patient attends annual eye exams? Dr. Bronson Curb   Dental Screening: Recommended annual dental exams for proper oral hygiene  Community Resource  Referral / Chronic Care Management: CRR required this visit?  No   CCM required this visit?  No      Plan:     I have personally reviewed and noted the following in the patient's chart:   Medical and social history Use of alcohol, tobacco or illicit drugs  Current medications and supplements including opioid prescriptions.  Functional ability and status Nutritional status Physical activity Advanced directives List of other physicians Hospitalizations, surgeries, and ER visits in previous 12 months Vitals Screenings to include cognitive, depression, and falls Referrals and appointments  In addition, I have reviewed and discussed with patient certain preventive protocols, quality metrics, and best practice recommendations. A written personalized care plan for preventive services as well as general preventive health recommendations were provided to patient.   Due to this being a telephonic visit, the after visit summary with patients personalized plan was offered to patient via mail or my-chart. Patient would like to access on my-chart.    Loma Messing, LPN   32/08/3341   Nurse Health Advisor  Nurse Notes: none

## 2021-06-23 ENCOUNTER — Ambulatory Visit (INDEPENDENT_AMBULATORY_CARE_PROVIDER_SITE_OTHER): Payer: PPO

## 2021-06-23 VITALS — Ht 64.0 in | Wt 189.0 lb

## 2021-06-23 DIAGNOSIS — Z0000 Encounter for general adult medical examination without abnormal findings: Secondary | ICD-10-CM | POA: Diagnosis not present

## 2021-06-23 NOTE — Patient Instructions (Signed)
Yolanda Walsh , Thank you for taking time to complete your Medicare Wellness Visit. I appreciate your ongoing commitment to your health goals. Please review the following plan we discussed and let me know if I can assist you in the future.   Screening recommendations/referrals: Colonoscopy: up to date, completed 07/26/16, due 07/26/21 Mammogram: up to date, completed 04/30/21, due 04/30/22 Bone Density: due, last completed 05/14/19, you plan to schedule next appointment Recommended yearly ophthalmology/optometry visit for glaucoma screening and checkup Recommended yearly dental visit for hygiene and checkup  Vaccinations: Influenza vaccine: up to date  Pneumococcal vaccine: up to date Tdap vaccine: Due-last completed 02/04/11 May obtain vaccine at your local pharmacy. Shingles vaccine: up to date    Covid-19:up to date   Advanced directives: Please bring a copy of Living Will and/or Healthcare Power of Attorney for your chart.   Conditions/risks identified: see problem list  Next appointment: Follow up in one year for your annual wellness visit 06/25/22 @ 9:00am, this will be a telephone visit   Preventive Care 65 Years and Older, Female Preventive care refers to lifestyle choices and visits with your health care provider that can promote health and wellness. What does preventive care include? A yearly physical exam. This is also called an annual well check. Dental exams once or twice a year. Routine eye exams. Ask your health care provider how often you should have your eyes checked. Personal lifestyle choices, including: Daily care of your teeth and gums. Regular physical activity. Eating a healthy diet. Avoiding tobacco and drug use. Limiting alcohol use. Practicing safe sex. Taking low-dose aspirin every day. Taking vitamin and mineral supplements as recommended by your health care provider. What happens during an annual well check? The services and screenings done by your health  care provider during your annual well check will depend on your age, overall health, lifestyle risk factors, and family history of disease. Counseling  Your health care provider may ask you questions about your: Alcohol use. Tobacco use. Drug use. Emotional well-being. Home and relationship well-being. Sexual activity. Eating habits. History of falls. Memory and ability to understand (cognition). Work and work Statistician. Reproductive health. Screening  You may have the following tests or measurements: Height, weight, and BMI. Blood pressure. Lipid and cholesterol levels. These may be checked every 5 years, or more frequently if you are over 77 years old. Skin check. Lung cancer screening. You may have this screening every year starting at age 34 if you have a 30-pack-year history of smoking and currently smoke or have quit within the past 15 years. Fecal occult blood test (FOBT) of the stool. You may have this test every year starting at age 85. Flexible sigmoidoscopy or colonoscopy. You may have a sigmoidoscopy every 5 years or a colonoscopy every 10 years starting at age 54. Hepatitis C blood test. Hepatitis B blood test. Sexually transmitted disease (STD) testing. Diabetes screening. This is done by checking your blood sugar (glucose) after you have not eaten for a while (fasting). You may have this done every 1-3 years. Bone density scan. This is done to screen for osteoporosis. You may have this done starting at age 67. Mammogram. This may be done every 1-2 years. Talk to your health care provider about how often you should have regular mammograms. Talk with your health care provider about your test results, treatment options, and if necessary, the need for more tests. Vaccines  Your health care provider may recommend certain vaccines, such as: Influenza vaccine. This  is recommended every year. Tetanus, diphtheria, and acellular pertussis (Tdap, Td) vaccine. You may need a Td  booster every 10 years. Zoster vaccine. You may need this after age 50. Pneumococcal 13-valent conjugate (PCV13) vaccine. One dose is recommended after age 47. Pneumococcal polysaccharide (PPSV23) vaccine. One dose is recommended after age 65. Talk to your health care provider about which screenings and vaccines you need and how often you need them. This information is not intended to replace advice given to you by your health care provider. Make sure you discuss any questions you have with your health care provider. Document Released: 07/25/2015 Document Revised: 03/17/2016 Document Reviewed: 04/29/2015 Elsevier Interactive Patient Education  2017 [place] Prevention in the Home Falls can cause injuries. They can happen to people of all ages. There are many things you can do to make your home safe and to help prevent falls. What can I do on the outside of my home? Regularly fix the edges of walkways and driveways and fix any cracks. Remove anything that might make you trip as you walk through a door, such as a raised step or threshold. Trim any bushes or trees on the path to your home. Use bright outdoor lighting. Clear any walking paths of anything that might make someone trip, such as rocks or tools. Regularly check to see if handrails are loose or broken. Make sure that both sides of any steps have handrails. Any raised decks and porches should have guardrails on the edges. Have any leaves, snow, or ice cleared regularly. Use sand or salt on walking paths during winter. Clean up any spills in your garage right away. This includes oil or grease spills. What can I do in the bathroom? Use night lights. Install grab bars by the toilet and in the tub and shower. Do not use towel bars as grab bars. Use non-skid mats or decals in the tub or shower. If you need to sit down in the shower, use a plastic, non-slip stool. Keep the floor dry. Clean up any water that spills on the floor  as soon as it happens. Remove soap buildup in the tub or shower regularly. Attach bath mats securely with double-sided non-slip rug tape. Do not have throw rugs and other things on the floor that can make you trip. What can I do in the bedroom? Use night lights. Make sure that you have a light by your bed that is easy to reach. Do not use any sheets or blankets that are too big for your bed. They should not hang down onto the floor. Have a firm chair that has side arms. You can use this for support while you get dressed. Do not have throw rugs and other things on the floor that can make you trip. What can I do in the kitchen? Clean up any spills right away. Avoid walking on wet floors. Keep items that you use a lot in easy-to-reach places. If you need to reach something above you, use a strong step stool that has a grab bar. Keep electrical cords out of the way. Do not use floor polish or wax that makes floors slippery. If you must use wax, use non-skid floor wax. Do not have throw rugs and other things on the floor that can make you trip. What can I do with my stairs? Do not leave any items on the stairs. Make sure that there are handrails on both sides of the stairs and use them. Fix handrails that  are broken or loose. Make sure that handrails are as long as the stairways. Check any carpeting to make sure that it is firmly attached to the stairs. Fix any carpet that is loose or worn. Avoid having throw rugs at the top or bottom of the stairs. If you do have throw rugs, attach them to the floor with carpet tape. Make sure that you have a light switch at the top of the stairs and the bottom of the stairs. If you do not have them, ask someone to add them for you. What else can I do to help prevent falls? Wear shoes that: Do not have high heels. Have rubber bottoms. Are comfortable and fit you well. Are closed at the toe. Do not wear sandals. If you use a stepladder: Make sure that it is  fully opened. Do not climb a closed stepladder. Make sure that both sides of the stepladder are locked into place. Ask someone to hold it for you, if possible. Clearly mark and make sure that you can see: Any grab bars or handrails. First and last steps. Where the edge of each step is. Use tools that help you move around (mobility aids) if they are needed. These include: Canes. Walkers. Scooters. Crutches. Turn on the lights when you go into a dark area. Replace any light bulbs as soon as they burn out. Set up your furniture so you have a clear path. Avoid moving your furniture around. If any of your floors are uneven, fix them. If there are any pets around you, be aware of where they are. Review your medicines with your doctor. Some medicines can make you feel dizzy. This can increase your chance of falling. Ask your doctor what other things that you can do to help prevent falls. This information is not intended to replace advice given to you by your health care provider. Make sure you discuss any questions you have with your health care provider. Document Released: 04/24/2009 Document Revised: 12/04/2015 Document Reviewed: 08/02/2014 Elsevier Interactive Patient Education  2017 [employer].

## 2021-06-24 ENCOUNTER — Encounter: Payer: Self-pay | Admitting: Family Medicine

## 2021-06-24 ENCOUNTER — Ambulatory Visit (INDEPENDENT_AMBULATORY_CARE_PROVIDER_SITE_OTHER): Payer: PPO | Admitting: Family Medicine

## 2021-06-24 ENCOUNTER — Other Ambulatory Visit: Payer: Self-pay

## 2021-06-24 VITALS — BP 122/68 | HR 69 | Temp 97.3°F | Ht 64.25 in | Wt 191.2 lb

## 2021-06-24 DIAGNOSIS — H3530 Unspecified macular degeneration: Secondary | ICD-10-CM

## 2021-06-24 DIAGNOSIS — H8102 Meniere's disease, left ear: Secondary | ICD-10-CM

## 2021-06-24 DIAGNOSIS — Z0000 Encounter for general adult medical examination without abnormal findings: Secondary | ICD-10-CM | POA: Diagnosis not present

## 2021-06-24 DIAGNOSIS — M85852 Other specified disorders of bone density and structure, left thigh: Secondary | ICD-10-CM | POA: Diagnosis not present

## 2021-06-24 DIAGNOSIS — E669 Obesity, unspecified: Secondary | ICD-10-CM

## 2021-06-24 DIAGNOSIS — E785 Hyperlipidemia, unspecified: Secondary | ICD-10-CM

## 2021-06-24 DIAGNOSIS — E559 Vitamin D deficiency, unspecified: Secondary | ICD-10-CM

## 2021-06-24 DIAGNOSIS — F411 Generalized anxiety disorder: Secondary | ICD-10-CM

## 2021-06-24 MED ORDER — HYDROCHLOROTHIAZIDE 12.5 MG PO TABS: 6.2500 mg | ORAL_TABLET | Freq: Every day | ORAL | 3 refills | Status: DC | PRN

## 2021-06-24 MED ORDER — ROSUVASTATIN CALCIUM 5 MG PO TABS: 5.0000 mg | ORAL_TABLET | ORAL | 3 refills | Status: DC

## 2021-06-24 MED ORDER — SERTRALINE HCL 50 MG PO TABS: 50.0000 mg | ORAL_TABLET | Freq: Every day | ORAL | 3 refills | Status: DC

## 2021-06-24 NOTE — Assessment & Plan Note (Signed)
Followed by ENT.  Stable period on low dose hctz 6.25mg  daily.

## 2021-06-24 NOTE — Assessment & Plan Note (Signed)
Encouraged healthy diet and lifestyle choices to affect sustainable weight loss.  ?

## 2021-06-24 NOTE — Assessment & Plan Note (Addendum)
Chronic, stable period on crestor MWF dosing. Discussed increased fiber/legumes to help lower LDL levels. The 10-year ASCVD risk score (Arnett DK, et al., 2019) is: 8.6%   Values used to calculate the score:     Age: 70 years     Sex: Female     Is Non-Hispanic African American: No     Diabetic: No     Tobacco smoker: No     Systolic Blood Pressure: 372 mmHg     Is BP treated: No     HDL Cholesterol: 55.3 mg/dL     Total Cholesterol: 197 mg/dL

## 2021-06-24 NOTE — Assessment & Plan Note (Addendum)
Stable DEXA last done 05/2019. Encouraged increase regular weight bearing exercises as well as reviewed calcium/vit D intake. Will recheck levels this coming year when she gets mammo done.

## 2021-06-24 NOTE — Assessment & Plan Note (Signed)
Stable period on daily sertraline 50mg .

## 2021-06-24 NOTE — Assessment & Plan Note (Signed)
rec continue vit D replacement.

## 2021-06-24 NOTE — Patient Instructions (Addendum)
Meds refilled today  You will be due for colonoscopy next month - you may call if you don't get a letter ([facility] ([phone]) [phone]) Increase fiber and legumes in diet to help lower LDL cholesterol. If not helping, we may discuss increasing crestor frequency.  Work on regular exercise routine.  You are doing well today. Good to see you today. Return as needed or in 1 year for next physical.   Health Maintenance After Age 70 After age 56, you are at a higher risk for certain long-term diseases and infections as well as injuries from falls. Falls are a major cause of broken bones and head injuries in people who are older than age 12. Getting regular preventive care can help to keep you healthy and well. Preventive care includes getting regular testing and making lifestyle changes as recommended by your health care provider. Talk with your health care provider about: Which screenings and tests you should have. A screening is a test that checks for a disease when you have no symptoms. A diet and exercise plan that is right for you. What should I know about screenings and tests to prevent falls? Screening and testing are the best ways to find a health problem early. Early diagnosis and treatment give you the best chance of managing medical conditions that are common after age 34. Certain conditions and lifestyle choices may make you more likely to have a fall. Your health care provider may recommend: Regular vision checks. Poor vision and conditions such as cataracts can make you more likely to have a fall. If you wear glasses, make sure to get your prescription updated if your vision changes. Medicine review. Work with your health care provider to regularly review all of the medicines you are taking, including over-the-counter medicines. Ask your health care provider about any side effects that may make you more likely to have a fall. Tell your health care provider if any medicines that you take make you  feel dizzy or sleepy. Strength and balance checks. Your health care provider may recommend certain tests to check your strength and balance while standing, walking, or changing positions. Foot health exam. Foot pain and numbness, as well as not wearing proper footwear, can make you more likely to have a fall. Screenings, including: Osteoporosis screening. Osteoporosis is a condition that causes the bones to get weaker and break more easily. Blood pressure screening. Blood pressure changes and medicines to control blood pressure can make you feel dizzy. Depression screening. You may be more likely to have a fall if you have a fear of falling, feel depressed, or feel unable to do activities that you used to do. Alcohol use screening. Using too much alcohol can affect your balance and may make you more likely to have a fall. Follow these instructions at home: Lifestyle Do not drink alcohol if: Your health care provider tells you not to drink. If you drink alcohol: Limit how much you have to: 0-1 drink a day for women. 0-2 drinks a day for men. Know how much alcohol is in your drink. In the [place] one drink equals one 12 oz bottle of beer (355 mL), one 5 oz glass of wine (148 mL), or one 1 oz glass of hard liquor (44 mL). Do not use any products that contain nicotine or tobacco. These products include cigarettes, chewing tobacco, and vaping devices, such as e-cigarettes. If you need help quitting, ask your health care provider. Activity  Follow a regular exercise program to  stay fit. This will help you maintain your balance. Ask your health care provider what types of exercise are appropriate for you. If you need a cane or walker, use it as recommended by your health care provider. Wear supportive shoes that have nonskid soles. Safety  Remove any tripping hazards, such as rugs, cords, and clutter. Install safety equipment such as grab bars in bathrooms and safety rails on stairs. Keep rooms  and walkways well-lit. General instructions Talk with your health care provider about your risks for falling. Tell your health care provider if: You fall. Be sure to tell your health care provider about all falls, even ones that seem minor. You feel dizzy, tiredness (fatigue), or off-balance. Take over-the-counter and prescription medicines only as told by your health care provider. These include supplements. Eat a healthy diet and maintain a healthy weight. A healthy diet includes low-fat dairy products, low-fat (lean) meats, and fiber from whole grains, beans, and lots of fruits and vegetables. Stay current with your vaccines. Schedule regular health, dental, and eye exams. Summary Having a healthy lifestyle and getting preventive care can help to protect your health and wellness after age 48. Screening and testing are the best way to find a health problem early and help you avoid having a fall. Early diagnosis and treatment give you the best chance for managing medical conditions that are more common for people who are older than age 62. Falls are a major cause of broken bones and head injuries in people who are older than age 33. Take precautions to prevent a fall at home. Work with your health care provider to learn what changes you can make to improve your health and wellness and to prevent falls. This information is not intended to replace advice given to you by your health care provider. Make sure you discuss any questions you have with your health care provider. Document Revised: 11/17/2020 Document Reviewed: 11/17/2020 Elsevier Patient Education  [place].

## 2021-06-24 NOTE — Assessment & Plan Note (Signed)
Preventative protocols reviewed and updated unless pt declined. Discussed healthy diet and lifestyle.  

## 2021-06-24 NOTE — Progress Notes (Signed)
Patient ID: Yolanda Walsh, female    DOB: 1951/01/30, 70 y.o.   MRN: [id number]  This visit was conducted in person.  BP 122/68    Pulse 69    Temp (!) 97.3 F (36.3 C) (Temporal)    Ht 5' 4.25" (1.632 m)    Wt 191 lb 3 oz (86.7 kg)    SpO2 98%    BMI 32.56 kg/m    CC: CPE Subjective:   HPI: Yolanda Walsh is a 70 y.o. female presenting on 06/24/2021 for Annual Exam (MCR prt 2. )   Saw health advisor last week for medicare wellness visit. Note reviewed.   No results found.  Flowsheet Row Clinical Support from 06/23/2021 in [facility]  PHQ-2 Total Score 0       Fall Risk  06/23/2021 06/18/2020 06/13/2019 06/06/2019 06/02/2018  Falls in the past year? 0 1 0 0 0  Comment - fell in shower - Emmi Telephone Survey: data to providers prior to load -  Number falls in past yr: 0 0 0 - -  Injury with Fall? 0 0 0 - -  Comment - - - - -  Risk for fall due to : No Fall Risks Medication side effect - - -  Follow up Falls prevention discussed Falls evaluation completed;Falls prevention discussed Falls evaluation completed;Falls prevention discussed - -   Meniere's disease seeing ENT. Trouble with full dose diazide (syncope), tolerates hctz 6.25mg  daily. No recent vertigo. Recently with new hearing aides.    Daughter recently moved back home (from [place]), now living with her which she has been enjoying.    Preventative: Colonoscopy 07/2016 - TA, diverticulosis, rpt 5 yrs Henrene Pastor).  Well woman with Dr. Marvel Plan h/o partal hysterectomy. Sees yearly (last seen 04/2020). Ovaries remain.  Mammogram - 04/2021 Birads1 @ [facility] DEXA 04/2012, 03/2015, 04/2017, 05/2019 T -2.4 hip, -1.8 spine - reviewed calicum and vit D intake, rec weight bearing exercise. Most calcium in diet.  Lung cancer screening - not eligible  Flu - yearly [place] 07/2019, 08/2019, booster 03/2020, bivalent booster 03/2021 Prevnar-13 2017, pneumovax 2018 - localized reaction to this.   Tdap 2012.  zostavax - 2013 Shingrix - 09/2017, 11/2017  Advanced directive - scanned in chart 06/2019. Daughter and son are HCPOA. Does not want prolonged life support. Ok with temporary measures.  Seat belt use discussed.  Sunscreen use discussed. No changing moles on skin.  Non smoker  Alcohol - none  Dentist - yearly Eye exam yearly on preservision MVI for MD - s/p cataracts 06/2019 ([place])  Bowel - no constipation  Bladder - no incontinence  Lives with cat  Widow, husband deceased from [place].  Daughter lives in [place], son local [place]).  Occupation: retired, prior worked in Psychologist, educational  Activity: walking daily at least 30 min  Diet: good water, fruits/vegetables daily - started eating daily harvest plant based soups and smoothies      Relevant past medical, surgical, family and social history reviewed and updated as indicated. Interim medical history since our last visit reviewed. Allergies and medications reviewed and updated. Outpatient Medications Prior to Visit  Medication Sig Dispense Refill   cetirizine (ZYRTEC) 10 MG tablet Take 10 mg by mouth daily.     Cholecalciferol (VITAMIN D3) 1000 UNITS CAPS Take 1 capsule (1,000 Units total) by mouth daily. 30 capsule    ibuprofen (ADVIL,MOTRIN) 200 MG tablet Take 200 mg by mouth every 6 (six) hours as  needed for mild pain or moderate pain.      meclizine (ANTIVERT) 25 MG tablet Take 1 tablet (25 mg total) by mouth 3 (three) times daily as needed for dizziness. 30 tablet 0   Multiple Vitamins-Minerals (PRESERVISION AREDS 2+MULTI VIT PO) Take 2 capsules by mouth daily.     hydrochlorothiazide (HYDRODIURIL) 12.5 MG tablet Take 0.5 tablets (6.25 mg total) by mouth daily as needed (meniere's). 15 tablet 6   rosuvastatin (CRESTOR) 5 MG tablet Take 1 tablet (5 mg total) by mouth every Monday, Wednesday, and Friday. 40 tablet 3   sertraline (ZOLOFT) 50 MG tablet Take 1.5 tablets (75 mg total) by mouth daily. (Patient  taking differently: Take 50 mg by mouth daily.) 135 tablet 3   No facility-administered medications prior to visit.     Per HPI unless specifically indicated in ROS section below Review of Systems  Constitutional:  Negative for activity change, appetite change, chills, fatigue, fever and unexpected weight change.  HENT:  Negative for hearing loss.   Eyes:  Negative for visual disturbance.  Respiratory:  Negative for cough, chest tightness, shortness of breath and wheezing.   Cardiovascular:  Negative for chest pain, palpitations and leg swelling.  Gastrointestinal:  Negative for abdominal distention, abdominal pain, blood in stool, constipation, diarrhea, nausea and vomiting.  Genitourinary:  Negative for difficulty urinating and hematuria.  Musculoskeletal:  Negative for arthralgias, myalgias and neck pain.  Skin:  Negative for rash.  Neurological:  Negative for dizziness, seizures, syncope and headaches.  Hematological:  Negative for adenopathy. Does not bruise/bleed easily.  Psychiatric/Behavioral:  Negative for dysphoric mood. The patient is not nervous/anxious.    Objective:  BP 122/68    Pulse 69    Temp (!) 97.3 F (36.3 C) (Temporal)    Ht 5' 4.25" (1.632 m)    Wt 191 lb 3 oz (86.7 kg)    SpO2 98%    BMI 32.56 kg/m   Wt Readings from Last 3 Encounters:  06/24/21 191 lb 3 oz (86.7 kg)  06/23/21 189 lb (85.7 kg)  01/19/21 187 lb 1 oz (84.9 kg)      Physical Exam Vitals and nursing note reviewed.  Constitutional:      Appearance: Normal appearance. She is not ill-appearing.  HENT:     Head: Normocephalic and atraumatic.     Right Ear: Tympanic membrane, ear canal and external ear normal. There is no impacted cerumen.     Left Ear: Tympanic membrane, ear canal and external ear normal. There is no impacted cerumen.  Eyes:     General:        Right eye: No discharge.        Left eye: No discharge.     Extraocular Movements: Extraocular movements intact.      Conjunctiva/sclera: Conjunctivae normal.     Pupils: Pupils are equal, round, and reactive to light.  Neck:     Thyroid: No thyroid mass or thyromegaly.     Vascular: No carotid bruit.  Cardiovascular:     Rate and Rhythm: Normal rate and regular rhythm.     Pulses: Normal pulses.     Heart sounds: Normal heart sounds. No murmur heard. Pulmonary:     Effort: Pulmonary effort is normal. No respiratory distress.     Breath sounds: Normal breath sounds. No wheezing, rhonchi or rales.  Abdominal:     General: Bowel sounds are normal. There is no distension.     Palpations: Abdomen is soft. There is  no mass.     Tenderness: There is no abdominal tenderness. There is no guarding or rebound.     Hernia: No hernia is present.  Musculoskeletal:     Cervical back: Normal range of motion and neck supple. No rigidity.     Right lower leg: No edema.     Left lower leg: No edema.  Lymphadenopathy:     Cervical: No cervical adenopathy.  Skin:    General: Skin is warm and dry.     Findings: No rash.  Neurological:     General: No focal deficit present.     Mental Status: She is alert. Mental status is at baseline.  Psychiatric:        Mood and Affect: Mood normal.        Behavior: Behavior normal.      Results for orders placed or performed in visit on 06/18/21  CBC with Differential/Platelet  Result Value Ref Range   WBC 7.0 4.0 - 10.5 K/uL   RBC 4.33 3.87 - 5.11 Mil/uL   Hemoglobin 11.7 (L) 12.0 - 15.0 g/dL   HCT 36.3 36.0 - 46.0 %   MCV 83.8 78.0 - 100.0 fl   MCHC 32.3 30.0 - 36.0 g/dL   RDW 14.3 11.5 - 15.5 %   Platelets 249.0 150.0 - 400.0 K/uL   Neutrophils Relative % 66.7 43.0 - 77.0 %   Lymphocytes Relative 24.9 12.0 - 46.0 %   Monocytes Relative 7.2 3.0 - 12.0 %   Eosinophils Relative 0.7 0.0 - 5.0 %   Basophils Relative 0.5 0.0 - 3.0 %   Neutro Abs 4.6 1.4 - 7.7 K/uL   Lymphs Abs 1.7 0.7 - 4.0 K/uL   Monocytes Absolute 0.5 0.1 - 1.0 K/uL   Eosinophils Absolute 0.1 0.0 -  0.7 K/uL   Basophils Absolute 0.0 0.0 - 0.1 K/uL  Lipid panel  Result Value Ref Range   Cholesterol 197 0 - 200 mg/dL   Triglycerides 121.0 0.0 - 149.0 mg/dL   HDL 55.30 >39.00 mg/dL   VLDL 24.2 0.0 - 40.0 mg/dL   LDL Cholesterol 118 (H) 0 - 99 mg/dL   Total CHOL/HDL Ratio 4    NonHDL 141.87   VITAMIN D 25 Hydroxy (Vit-D Deficiency, Fractures)  Result Value Ref Range   VITD 40.67 30.00 - 100.00 ng/mL  Comprehensive metabolic panel  Result Value Ref Range   Sodium 138 135 - 145 mEq/L   Potassium 4.3 3.5 - 5.1 mEq/L   Chloride 102 96 - 112 mEq/L   CO2 27 19 - 32 mEq/L   Glucose, Bld 94 70 - 99 mg/dL   BUN 16 6 - 23 mg/dL   Creatinine, Ser 0.80 0.40 - 1.20 mg/dL   Total Bilirubin 0.6 0.2 - 1.2 mg/dL   Alkaline Phosphatase 72 39 - 117 U/L   AST 19 0 - 37 U/L   ALT 13 0 - 35 U/L   Total Protein 6.9 6.0 - 8.3 g/dL   Albumin 4.1 3.5 - 5.2 g/dL   GFR 74.54 >60.00 mL/min   Calcium 9.6 8.4 - 10.5 mg/dL   Depression screen [facility] 2/9 06/23/2021 06/18/2020 06/13/2019 06/02/2018 06/01/2017  Decreased Interest 0 0 0 0 0  Down, Depressed, Hopeless 0 0 0 0 1  PHQ - 2 Score 0 0 0 0 1  Altered sleeping - 0 0 0 0  Tired, decreased energy - 0 0 0 0  Change in appetite - 0 0 0 0  Feeling bad  or failure about yourself  - 0 0 0 0  Trouble concentrating - 0 0 0 0  Moving slowly or fidgety/restless - 0 0 0 0  Suicidal thoughts - 0 0 0 0  PHQ-9 Score - 0 0 0 1  Difficult doing work/chores - Not difficult at all Not difficult at all Not difficult at all Not difficult at all   No flowsheet data found.  Assessment & Plan:  This visit occurred during the SARS-CoV-2 public health emergency.  Safety protocols were in place, including screening questions prior to the visit, additional usage of staff PPE, and extensive cleaning of exam room while observing appropriate contact time as indicated for disinfecting solutions.   Problem List Items Addressed This Visit     Healthcare maintenance - Primary  (Chronic)    Preventative protocols reviewed and updated unless pt declined. Discussed healthy diet and lifestyle.       Osteopenia    Stable DEXA last done 05/2019. Encouraged increase regular weight bearing exercises as well as reviewed calcium/vit D intake. Will recheck levels this coming year when she gets mammo done.       GAD (generalized anxiety disorder)    Stable period on daily sertraline 50mg .       Relevant Medications   sertraline (ZOLOFT) 50 MG tablet   Hyperlipidemia    Chronic, stable period on crestor MWF dosing. Discussed increased fiber/legumes to help lower LDL levels. The 10-year ASCVD risk score (Arnett DK, et al., 2019) is: 8.6%   Values used to calculate the score:     Age: 27 years     Sex: Female     Is Non-Hispanic African American: No     Diabetic: No     Tobacco smoker: No     Systolic Blood Pressure: 220 mmHg     Is BP treated: No     HDL Cholesterol: 55.3 mg/dL     Total Cholesterol: 197 mg/dL       Relevant Medications   hydrochlorothiazide (HYDRODIURIL) 12.5 MG tablet   rosuvastatin (CRESTOR) 5 MG tablet   Obesity, Class I, BMI 30-34.9    Encouraged healthy diet and lifestyle choices to affect sustainable weight loss.       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       Meniere disease, left    Followed by ENT.  Stable period on low dose hctz 6.25mg  daily.         Meds ordered this encounter  Medications   hydrochlorothiazide (HYDRODIURIL) 12.5 MG tablet    Sig: Take 0.5 tablets (6.25 mg total) by mouth daily as needed (meniere's).    Dispense:  45 tablet    Refill:  3   rosuvastatin (CRESTOR) 5 MG tablet    Sig: Take 1 tablet (5 mg total) by mouth every Monday, Wednesday, and Friday.    Dispense:  40 tablet    Refill:  3   sertraline (ZOLOFT) 50 MG tablet    Sig: Take 1 tablet (50 mg total) by mouth  daily.    Dispense:  90 tablet    Refill:  3   No orders of the defined types were placed in this encounter.   Patient instructions: Meds refilled today  You will be due for colonoscopy next month - you may call if you don't get a letter Press photographer  GI [phone]) Increase fiber and legumes in diet to help lower LDL cholesterol. If not helping, we may discuss increasing crestor frequency.  Work on regular exercise routine.  You are doing well today. Good to see you today. Return as needed or in 1 year for next physical.   Follow up plan: Return in about 1 year (around 06/24/2022) for annual exam, prior fasting for blood work, medicare wellness visit.  Ria Bush, MD

## 2021-06-24 NOTE — Assessment & Plan Note (Signed)
Regularly sees eye doctor, on Preservision MVI

## 2021-09-14 ENCOUNTER — Telehealth: Payer: Self-pay

## 2021-09-14 NOTE — Progress Notes (Signed)
? ? ?  Chronic Care Management ?Pharmacy Assistant  ? ?Name: Yolanda Walsh  MRN: [id number] DOB: March 06, 1951 ? ?Reason for Encounter: CCM (General Adherence) ?  ?Recent office visits:  ?06/24/2021 - Ria Bush, MD - Patient presented for annual exam. Change: sertraline (ZOLOFT) 50 MG tablet vs. 75 mg.  ?06/23/2021 - Orrin Brigham, LPN - Patient presented for Annual Wellness Visit.  ? ?Recent consult visits:  ?None since last CCM contact ? ?Hospital visits:  ?None in previous 6 months ? ?Medications: ?Outpatient Encounter Medications as of 09/14/2021  ?Medication Sig  ? cetirizine (ZYRTEC) 10 MG tablet Take 10 mg by mouth daily.  ? Cholecalciferol (VITAMIN D3) 1000 UNITS CAPS Take 1 capsule (1,000 Units total) by mouth daily.  ? hydrochlorothiazide (HYDRODIURIL) 12.5 MG tablet Take 0.5 tablets (6.25 mg total) by mouth daily as needed (meniere's).  ? ibuprofen (ADVIL,MOTRIN) 200 MG tablet Take 200 mg by mouth every 6 (six) hours as needed for mild pain or moderate pain.   ? meclizine (ANTIVERT) 25 MG tablet Take 1 tablet (25 mg total) by mouth 3 (three) times daily as needed for dizziness.  ? Multiple Vitamins-Minerals (PRESERVISION AREDS 2+MULTI VIT PO) Take 2 capsules by mouth daily.  ? rosuvastatin (CRESTOR) 5 MG tablet Take 1 tablet (5 mg total) by mouth every Monday, Wednesday, and Friday.  ? sertraline (ZOLOFT) 50 MG tablet Take 1 tablet (50 mg total) by mouth daily.  ? ?No facility-administered encounter medications on file as of 09/14/2021.  ? ?[place] on 09/14/2021 for general disease state and medication adherence call.  ? ?Patient is not more than 5 days past due for refill on the following medications per chart history: ? ?[facility] Medications: ?Medication Name/mg Last Fill Days Supply ?Rosuvastatin 5 mg  07/24/2021 90   ? ?What concerns do you have about your medications? Patient stated she does not have any concerns at the moment.  ? ?The patient denies side effects with their medications.   ? ?How often do you forget or accidentally miss a dose? Rarely ? ?Do you use a pillbox? Yes ? ?Are you having any problems getting your medications from your pharmacy? No ? ?Has the cost of your medications been a concern? No ? ?Since last visit with CPP, the following interventions have been made.  ?Change: sertraline (ZOLOFT) 50 MG tablet vs. 75 mg.  ? ?The patient has not had an ED visit since last contact.  ? ?The patient denies problems with their health.  ? ?Patient denies concerns or questions for Charlene Brooke, PharmD at this time.  ? ?Care Gaps: ?Annual wellness visit in last year? Yes 06/23/2021 ?Most Recent BP reading: 122/68 on 06/24/2021 ? ?Upcoming appointments: ?No appointments scheduled within the next 30 days. ? ?Charlene Brooke, CPP notified ? ?Marijean Niemann, RMA ?Clinical Pharmacy Assistant ?[phone] ? ?Time Spent: 30 Minutes ? ? ? ? ? ? ? ? ?

## 2021-09-18 DIAGNOSIS — H353132 Nonexudative age-related macular degeneration, bilateral, intermediate dry stage: Secondary | ICD-10-CM | POA: Diagnosis not present

## 2021-09-30 ENCOUNTER — Encounter: Payer: Self-pay | Admitting: Internal Medicine

## 2021-11-09 ENCOUNTER — Other Ambulatory Visit: Payer: Self-pay

## 2021-11-09 ENCOUNTER — Ambulatory Visit (AMBULATORY_SURGERY_CENTER): Payer: Self-pay

## 2021-11-09 VITALS — Ht 64.0 in | Wt 195.0 lb

## 2021-11-09 DIAGNOSIS — Z86010 Personal history of colonic polyps: Secondary | ICD-10-CM

## 2021-11-09 HISTORY — PX: COLONOSCOPY: SHX174

## 2021-11-09 MED ORDER — PLENVU 140 G PO SOLR: 1.0000 | Freq: Once | ORAL | 0 refills | Status: AC

## 2021-11-09 NOTE — Progress Notes (Signed)
Denies allergies to eggs or soy products. Denies complication of anesthesia or sedation. Denies use of weight loss medication. Denies use of O2.   Emmi instructions given for colonoscopy.  

## 2021-11-17 ENCOUNTER — Telehealth: Payer: Self-pay | Admitting: Family Medicine

## 2021-11-17 NOTE — Telephone Encounter (Signed)
Updated pt's chart showing Tdap vaccine.  ?

## 2021-11-17 NOTE — Telephone Encounter (Signed)
Pt came in office to droop off copy of vaccination was given by Pharmacist . It was place in PCP folder ?

## 2021-11-23 ENCOUNTER — Ambulatory Visit (AMBULATORY_SURGERY_CENTER): Payer: PPO | Admitting: Internal Medicine

## 2021-11-23 ENCOUNTER — Encounter: Payer: Self-pay | Admitting: Internal Medicine

## 2021-11-23 VITALS — BP 107/55 | HR 56 | Temp 96.9°F | Resp 12 | Ht 64.0 in | Wt 195.0 lb

## 2021-11-23 DIAGNOSIS — Z86010 Personal history of colonic polyps: Secondary | ICD-10-CM | POA: Diagnosis not present

## 2021-11-23 DIAGNOSIS — D124 Benign neoplasm of descending colon: Secondary | ICD-10-CM | POA: Diagnosis not present

## 2021-11-23 DIAGNOSIS — I10 Essential (primary) hypertension: Secondary | ICD-10-CM | POA: Diagnosis not present

## 2021-11-23 MED ORDER — SODIUM CHLORIDE 0.9 % IV SOLN: 500.0000 mL | Freq: Once | INTRAVENOUS | Status: DC

## 2021-11-23 NOTE — Patient Instructions (Signed)
YOU HAD AN ENDOSCOPIC PROCEDURE TODAY AT THE [facility]:   Refer to the procedure report that was given to you for any specific questions about what was found during the examination.  If the procedure report does not answer your questions, please call your gastroenterologist to clarify.  If you requested that your care partner not be given the details of your procedure findings, then the procedure report has been included in a sealed envelope for you to review at your convenience later.  YOU SHOULD EXPECT: Some feelings of bloating in the abdomen. Passage of more gas than usual.  Walking can help get rid of the air that was put into your GI tract during the procedure and reduce the bloating. If you had a lower endoscopy (such as a colonoscopy or flexible sigmoidoscopy) you may notice spotting of blood in your stool or on the toilet paper. If you underwent a bowel prep for your procedure, you may not have a normal bowel movement for a few days.  Please Note:  You might notice some irritation and congestion in your nose or some drainage.  This is from the oxygen used during your procedure.  There is no need for concern and it should clear up in a day or so.  SYMPTOMS TO REPORT IMMEDIATELY:   Following lower endoscopy (colonoscopy or flexible sigmoidoscopy):  Excessive amounts of blood in the stool  Significant tenderness or worsening of abdominal pains  Swelling of the abdomen that is new, acute  Fever of 100F or higher  For urgent or emergent issues, a gastroenterologist can be reached at any hour by calling [phone]. Do not use MyChart messaging for urgent concerns.    DIET:  We do recommend a small meal at first, but then you may proceed to your regular diet.  Drink plenty of fluids but you should avoid alcoholic beverages for 24 hours.  ACTIVITY:  You should plan to take it easy for the rest of today and you should NOT DRIVE or use heavy machinery until tomorrow (because  of the sedation medicines used during the test).    FOLLOW UP: Our staff will call the number listed on your records 48-72 hours following your procedure to check on you and address any questions or concerns that you may have regarding the information given to you following your procedure. If we do not reach you, we will leave a message.  We will attempt to reach you two times.  During this call, we will ask if you have developed any symptoms of COVID 19. If you develop any symptoms (ie: fever, flu-like symptoms, shortness of breath, cough etc.) before then, please call [phone].  If you test positive for Covid 19 in the 2 weeks post procedure, please call and report this information to us.    If any biopsies were taken you will be contacted by phone or by letter within the next 1-3 weeks.  Please call us at [phone] if you have not heard about the biopsies in 3 weeks.    SIGNATURES/CONFIDENTIALITY: You and/or your care partner have signed paperwork which will be entered into your electronic medical record.  These signatures attest to the fact that that the information above on your After Visit Summary has been reviewed and is understood.  Full responsibility of the confidentiality of this discharge information lies with you and/or your care-partner. 

## 2021-11-23 NOTE — Progress Notes (Signed)
Pt. Reports no change in her medical or surgical history since her pre-visit 11/09/2021. ?

## 2021-11-23 NOTE — Progress Notes (Signed)
Called to room to assist during endoscopic procedure.  Patient ID and intended procedure confirmed with present staff. Received instructions for my participation in the procedure from the performing physician.  

## 2021-11-23 NOTE — Progress Notes (Signed)
HISTORY OF PRESENT ILLNESS: ? ?Yolanda Walsh is a 71 y.o. female with a history of adenomatous colon polyps presents today for surveillance colonoscopy.  No complaints ? ?REVIEW OF SYSTEMS: ? ?All non-GI ROS negative. ? ?Past Medical History:  ?Diagnosis Date  ? Allergy   ? Ankle fracture   ? right  ? Anxiety   ? Cataract   ? Depression   ? History of anal fissures 2013  ? worsened by constipation   ? History of hemorrhoids   ? w/o complications  ? History of rectal fissure   ? Hyperlipidemia 04/24/2012  ? Lichen sclerosus   ? temovate cream  ? Medial meniscus tear   ? Osteopenia   ? dexa 2013, 2016  ? Trigger finger   ? Vertigo   ? ? ?Past Surgical History:  ?Procedure Laterality Date  ? CATARACT EXTRACTION W/PHACO Left 07/04/2019  ? Procedure: CATARACT EXTRACTION PHACO AND INTRAOCULAR LENS PLACEMENT (IOC) LEFT  6.67  00:44.0  15.2%;  Surgeon: Leandrew Koyanagi, MD;  Location: [facility];  Service: Ophthalmology;  Laterality: Left;  ? CATARACT EXTRACTION W/PHACO Right 08/01/2019  ? Procedure: CATARACT EXTRACTION PHACO AND INTRAOCULAR LENS PLACEMENT (IOC) RIGHT 5.33  00:45.4  11.8%;  Surgeon: Leandrew Koyanagi, MD;  Location: [facility];  Service: Ophthalmology;  Laterality: Right;  ? COLONOSCOPY  09/2003  ? COLONOSCOPY  07/2016  ? TA, diverticulosis, rpt 5 yrs Henrene Pastor)  ? dexa  04/2012  ? T -2.1 hip, -1.2 spine  ? dexa  03/2015  ? T -2.4 hip, -1.7 spine  ? I & D EXTREMITY Right 03/16/2014  ? cat bite hand s/p I&D in OR Fredna Dow)  ? left hand surgery  1962  ? tendon laceration  ? PARTIAL HYSTERECTOMY  1980  ? for prolapse, ovaries remain  ? ? ?Social History ?Yolanda Walsh  reports that she has never smoked. She has never used smokeless tobacco. She reports that she does not currently use alcohol. She reports that she does not use drugs. ? ?family history includes CAD (age of onset: 7) in her brother; CAD (age of onset: 79) in an other family member; Cancer in her mother; Coronary artery  disease (age of onset: 40) in her father; Coronary artery disease (age of onset: 59) in her mother; Crohn's disease in her sister; Diabetes in her maternal grandfather; Hypertension in her brother and sister; Parkinsonism (age of onset: 32) in her brother; Stroke in her brother. ? ?No Known Allergies ? ?  ? ?PHYSICAL EXAMINATION: ? ?Vital signs: BP (!) 144/64   Pulse 62   Temp (!) 96.9 ?F (36.1 ?C)   Resp 17   Ht '5\' 4"'$  (1.626 m)   Wt 195 lb (88.5 kg)   SpO2 99%   BMI 33.47 kg/m?  ?General: Well-developed, well-nourished, no acute distress ?HEENT: Sclerae are anicteric, conjunctiva pink. Oral mucosa intact ?Lungs: Clear ?Heart: Regular ?Abdomen: soft, nontender, nondistended, no obvious ascites, no peritoneal signs, normal bowel sounds. No organomegaly. ?Extremities: No edema ?Psychiatric: alert and oriented x3. Cooperative  ? ? ? ?ASSESSMENT: ? ?History of adenomatous colon polyps ? ? ?PLAN: ? ?Surveillance colonoscopy ? ? ? ? ?  ?

## 2021-11-23 NOTE — Progress Notes (Signed)
Report to PACU, RN, vss, BBS= Clear.  

## 2021-11-23 NOTE — Op Note (Signed)
[place] ?Patient Name: Yolanda Walsh ?Procedure Date: 11/23/2021 9:33 AM ?MRN: [id number] ?Endoscopist: Docia Chuck. Henrene Pastor , MD ?Age: 71 ?Referring MD:  ?Date of Birth: 06-14-1951 ?Gender: Female ?Account #: [id number] ?Procedure:                Colonoscopy with cold snare polypectomy x 1 ?Indications:              High risk colon cancer surveillance: Personal  ?                          history of non-advanced adenoma. Previous  ?                          examination January 2018 ?Medicines:                Monitored Anesthesia Care ?Procedure:                Pre-Anesthesia Assessment: ?                          - Prior to the procedure, a History and Physical  ?                          was performed, and patient medications and  ?                          allergies were reviewed. The patient's tolerance of  ?                          previous anesthesia was also reviewed. The risks  ?                          and benefits of the procedure and the sedation  ?                          options and risks were discussed with the patient.  ?                          All questions were answered, and informed consent  ?                          was obtained. Prior Anticoagulants: The patient has  ?                          taken no previous anticoagulant or antiplatelet  ?                          agents. ASA Grade Assessment: II - A patient with  ?                          mild systemic disease. After reviewing the risks  ?                          and benefits, the patient was deemed in  ?  satisfactory condition to undergo the procedure. ?                          After obtaining informed consent, the colonoscope  ?                          was passed under direct vision. Throughout the  ?                          procedure, the patient's blood pressure, pulse, and  ?                          oxygen saturations were monitored continuously. The  ?                          CF [id number]  #[id number] was introduced through the anus  ?                          and advanced to the the cecum, identified by  ?                          appendiceal orifice and ileocecal valve. The  ?                          ileocecal valve, appendiceal orifice, and rectum  ?                          were photographed. The quality of the bowel  ?                          preparation was excellent. The colonoscopy was  ?                          performed without difficulty. The patient tolerated  ?                          the procedure well. The bowel preparation used was  ?                          SUPREP via split dose instruction. ?Scope In: 9:43:15 AM ?Scope Out: 9:57:53 AM ?Scope Withdrawal Time: 0 hours 10 minutes 17 seconds  ?Total Procedure Duration: 0 hours 14 minutes 38 seconds  ?Findings:                 A 2 mm polyp was found in the descending colon. The  ?                          polyp was sessile. The polyp was removed with a  ?                          cold snare. Resection and retrieval were complete. ?                          Multiple diverticula were found in  the sigmoid  ?                          colon. ?                          Internal hemorrhoids were found during  ?                          retroflexion. The hemorrhoids were small. ?                          The exam was otherwise without abnormality on  ?                          direct and retroflexion views. ?Complications:            No immediate complications. Estimated blood loss:  ?                          None. ?Estimated Blood Loss:     Estimated blood loss: none. ?Impression:               - One 2 mm polyp in the descending colon, removed  ?                          with a cold snare. Resected and retrieved. ?                          - Diverticulosis in the sigmoid colon. ?                          - Internal hemorrhoids. ?                          - The examination was otherwise normal on direct  ?                          and  retroflexion views. ?Recommendation:           - Repeat colonoscopy in 7-10 years for surveillance. ?                          - Patient has a contact number available for  ?                          emergencies. The signs and symptoms of potential  ?                          delayed complications were discussed with the  ?                          patient. Return to normal activities tomorrow.  ?                          Written discharge instructions were provided to the  ?  patient. ?                          - Resume previous diet. ?                          - Continue present medications. ?                          - Await pathology results. ?Docia Chuck. Henrene Pastor, MD ?11/23/2021 10:05:43 AM ?This report has been signed electronically. ?

## 2021-11-25 ENCOUNTER — Telehealth: Payer: Self-pay

## 2021-11-25 ENCOUNTER — Encounter: Payer: Self-pay | Admitting: Internal Medicine

## 2021-11-25 NOTE — Telephone Encounter (Signed)
?  Follow up Call- ? ? ?  11/23/2021  ?  8:47 AM  ?Call back number  ?Post procedure Call Back phone  # [phone]  ?Permission to leave phone message Yes  ?  ? ?Patient questions: ? ?Do you have a fever, pain , or abdominal swelling? No. ?Pain Score  0 * ? ?Have you tolerated food without any problems? Yes.   ? ?Have you been able to return to your normal activities? Yes.   ? ?Do you have any questions about your discharge instructions: ?Diet   No. ?Medications  No. ?Follow up visit  No. ? ?Do you have questions or concerns about your Care? No. ? ?Actions: ?* If pain score is 4 or above: ?No action needed, pain <4. ? ? ?

## 2021-12-11 ENCOUNTER — Encounter: Payer: Self-pay | Admitting: Family

## 2021-12-11 ENCOUNTER — Ambulatory Visit (INDEPENDENT_AMBULATORY_CARE_PROVIDER_SITE_OTHER): Payer: PPO | Admitting: Family

## 2021-12-11 VITALS — BP 108/64 | HR 64 | Temp 98.0°F | Resp 16 | Ht 64.0 in | Wt 194.5 lb

## 2021-12-11 DIAGNOSIS — R140 Abdominal distension (gaseous): Secondary | ICD-10-CM | POA: Insufficient documentation

## 2021-12-11 DIAGNOSIS — K5900 Constipation, unspecified: Secondary | ICD-10-CM | POA: Diagnosis not present

## 2021-12-11 DIAGNOSIS — R0781 Pleurodynia: Secondary | ICD-10-CM | POA: Insufficient documentation

## 2021-12-11 DIAGNOSIS — R12 Heartburn: Secondary | ICD-10-CM | POA: Insufficient documentation

## 2021-12-11 MED ORDER — OMEPRAZOLE 20 MG PO CPDR: 20.0000 mg | DELAYED_RELEASE_CAPSULE | Freq: Every day | ORAL | 0 refills | Status: DC

## 2021-12-11 NOTE — Assessment & Plan Note (Signed)
otc stool softener and miralax until relief.  Increase water intake Bland diet to allow for bowerl rest

## 2021-12-11 NOTE — Assessment & Plan Note (Signed)
Trial omeprazole 20 mg once daily Try to decrease and or avoid spicy foods, fried fatty foods, and also caffeine and chocolate as these can increase heartburn symptoms.

## 2021-12-11 NOTE — Assessment & Plan Note (Signed)
Suspected due to constipation  Pt declines kub today  Will work on treating constipation to see if relief

## 2021-12-11 NOTE — Progress Notes (Signed)
Established Patient Office Visit  Subjective:  Patient ID: Yolanda Walsh, female    DOB: 1951-04-17  Age: 71 y.o. MRN: [id number]  CC:  Chief Complaint  Patient presents with   Abdominal Pain    Pain under rib cage.  X last Sunday    HPI Yolanda Walsh is here today with concerns.   Five days ago states had a little pinching pain under her left rib that lasted most of the day and she states it was constant throughout the day. She states since it has eased off. Not aggravated by movement.   She has felt achy and bloated in her stomach as well on and off.  No diarrhea, does have some constipation and states has not been eating very healthy.  Slight nausea on and off.  If she eats anything she feels some heaviness. Did drink some kombucha which helped a little bit.  No blood in stool.   No increased flatulence.  She did have a colonoscopy may 15th, tolerated well. Just wanted to know FYI  Reviewed , and small 2 mm polyp resected.   Does feel increased bloating after eating.  Also did have a spicy meal that night it started and has felt discomfrt since.   Past Medical History:  Diagnosis Date   Allergy    Ankle fracture    right   Anxiety    Cataract    Depression    History of anal fissures 2013   worsened by constipation    History of hemorrhoids    w/o complications   History of rectal fissure    Hyperlipidemia 01/09/1600   Lichen sclerosus    temovate cream   Medial meniscus tear    Osteopenia    dexa 2013, 2016   Trigger finger    Vertigo     Past Surgical History:  Procedure Laterality Date   CATARACT EXTRACTION W/PHACO Left 07/04/2019   Procedure: CATARACT EXTRACTION PHACO AND INTRAOCULAR LENS PLACEMENT (IOC) LEFT  6.67  00:44.0  15.2%;  Surgeon: Leandrew Koyanagi, MD;  Location: [place];  Service: Ophthalmology;  Laterality: Left;   CATARACT EXTRACTION W/PHACO Right 08/01/2019   Procedure: CATARACT EXTRACTION PHACO AND INTRAOCULAR LENS  PLACEMENT (IOC) RIGHT 5.33  00:45.4  11.8%;  Surgeon: Leandrew Koyanagi, MD;  Location: [place];  Service: Ophthalmology;  Laterality: Right;   COLONOSCOPY  09/2003   COLONOSCOPY  07/2016   TA, diverticulosis, rpt 5 yrs Henrene Pastor)   dexa  04/2012   T -2.1 hip, -1.2 spine   dexa  03/2015   T -2.4 hip, -1.7 spine   I & D EXTREMITY Right 03/16/2014   cat bite hand s/p I&D in OR ([name])   left hand surgery  1962   tendon laceration   PARTIAL HYSTERECTOMY  1980   for prolapse, ovaries remain    Family History  Problem Relation Age of Onset   Cancer Mother        leukemia   Coronary artery disease Mother 20   Coronary artery disease Father 60       MI   Hypertension Sister    Crohn's disease Sister    Parkinsonism Brother 61       deceased   Stroke Brother    CAD Brother 35       7 stents   Hypertension Brother    Diabetes Maternal Grandfather    CAD Other 67       stent   Colon  cancer Neg Hx    Esophageal cancer Neg Hx    Rectal cancer Neg Hx    Stomach cancer Neg Hx     Social History   Socioeconomic History   Marital status: Widowed    Spouse name: Not on file   Number of children: 2   Years of education: 12   Highest education level: High school graduate  Occupational History   Occupation: Retired  Tobacco Use   Smoking status: Never   Smokeless tobacco: Never  Vaping Use   Vaping Use: Never used  Substance and Sexual Activity   Alcohol use: Not Currently   Drug use: No   Sexual activity: Not on file  Other Topics Concern   Not on file  Social History Narrative   Lives with cat    Widow, husband deceased from [place]. Brother died from PD. Sister died 01-05-2020   Daughter lives in [place]. Mother of Nigel Sloop.    Occupation: retired, prior worked in Psychologist, educational    Activity: walking daily at least 30 min    Diet: good water, fruits/vegetables daily - started eating daily harvest plant based soups and smoothies    Right-handed.   No daily use  of caffeine.   Social Determinants of Health   Financial Resource Strain: Low Risk    Difficulty of Paying Living Expenses: Not hard at all  Food Insecurity: No Food Insecurity   Worried About Charity fundraiser in the Last Year: Never true   Pax in the Last Year: Never true  Transportation Needs: No Transportation Needs   Lack of Transportation (Medical): No   Lack of Transportation (Non-Medical): No  Physical Activity: Insufficiently Active   Days of Exercise per Week: 2 days   Minutes of Exercise per Session: 30 min  Stress: No Stress Concern Present   Feeling of Stress : Not at all  Social Connections: Socially Isolated   Frequency of Communication with Friends and Family: More than three times a week   Frequency of Social Gatherings with Friends and Family: More than three times a week   Attends Religious Services: Never   Marine scientist or Organizations: No   Attends Archivist Meetings: Never   Marital Status: Widowed  Human resources officer Violence: Not At Risk   Fear of Current or Ex-Partner: No   Emotionally Abused: No   Physically Abused: No   Sexually Abused: No    Outpatient Medications Prior to Visit  Medication Sig Dispense Refill   Cholecalciferol (VITAMIN D3) 1000 UNITS CAPS Take 1 capsule (1,000 Units total) by mouth daily. 30 capsule    fexofenadine (ALLEGRA) 180 MG tablet Take 180 mg by mouth daily.     hydrochlorothiazide (HYDRODIURIL) 12.5 MG tablet Take 0.5 tablets (6.25 mg total) by mouth daily as needed (meniere's). 45 tablet 3   ibuprofen (ADVIL,MOTRIN) 200 MG tablet Take 200 mg by mouth every 6 (six) hours as needed for mild pain or moderate pain.     meclizine (ANTIVERT) 25 MG tablet Take 1 tablet (25 mg total) by mouth 3 (three) times daily as needed for dizziness. 30 tablet 0   Multiple Vitamins-Minerals (PRESERVISION AREDS 2+MULTI VIT PO) Take 2 capsules by mouth daily.     rosuvastatin (CRESTOR) 5 MG tablet Take 1 tablet  (5 mg total) by mouth every Monday, Wednesday, and Friday. 40 tablet 3   sertraline (ZOLOFT) 50 MG tablet Take 1 tablet (50 mg total) by mouth daily. [place]  tablet 3   No facility-administered medications prior to visit.    No Known Allergies      Objective:    Physical Exam Vitals reviewed.  Constitutional:      General: She is not in acute distress.    Appearance: Normal appearance. She is not ill-appearing or toxic-appearing.  HENT:     Right Ear: Tympanic membrane normal.     Left Ear: Tympanic membrane normal.     Mouth/Throat:     Pharynx: No pharyngeal swelling.     Tonsils: No tonsillar exudate.  Eyes:     Conjunctiva/sclera: Conjunctivae normal.  Neck:     Thyroid: No thyroid mass.  Cardiovascular:     Rate and Rhythm: Normal rate and regular rhythm.  Pulmonary:     Effort: Pulmonary effort is normal.     Breath sounds: Normal breath sounds.     Comments: Slight pain on lower base of outer rib left side  No pain with movement  Abdominal:     General: Abdomen is flat. Bowel sounds are normal.     Palpations: Abdomen is soft.     Tenderness: There is abdominal tenderness (llq and mild epigastric tenderness).  Musculoskeletal:        General: Normal range of motion.  Lymphadenopathy:     Cervical:     Right cervical: No superficial cervical adenopathy.    Left cervical: No superficial cervical adenopathy.  Skin:    General: Skin is warm.     Capillary Refill: Capillary refill takes less than 2 seconds.  Neurological:     General: No focal deficit present.     Mental Status: She is alert and oriented to person, place, and time. Mental status is at baseline.  Psychiatric:        Mood and Affect: Mood normal.        Behavior: Behavior normal.        Thought Content: Thought content normal.        Judgment: Judgment normal.    BP 108/64   Pulse 64   Temp 98 F (36.7 C)   Resp 16   Ht '5\' 4"'$  (1.626 m)   Wt 194 lb 8 oz (88.2 kg)   SpO2 97%   BMI 33.39  kg/m  Wt Readings from Last 3 Encounters:  12/11/21 194 lb 8 oz (88.2 kg)  11/23/21 195 lb (88.5 kg)  11/09/21 195 lb (88.5 kg)     There are no preventive care reminders to display for this patient.  There are no preventive care reminders to display for this patient.  Lab Results  Component Value Date   TSH 3.11 06/19/2020   Lab Results  Component Value Date   WBC 7.0 06/18/2021   HGB 11.7 (L) 06/18/2021   HCT 36.3 06/18/2021   MCV 83.8 06/18/2021   PLT 249.0 06/18/2021   Lab Results  Component Value Date   NA 138 06/18/2021   K 4.3 06/18/2021   CO2 27 06/18/2021   GLUCOSE 94 06/18/2021   BUN 16 06/18/2021   CREATININE 0.80 06/18/2021   BILITOT 0.6 06/18/2021   ALKPHOS 72 06/18/2021   AST 19 06/18/2021   ALT 13 06/18/2021   PROT 6.9 06/18/2021   ALBUMIN 4.1 06/18/2021   CALCIUM 9.6 06/18/2021   ANIONGAP 9 01/09/2021   GFR 74.54 06/18/2021   No results found for: HGBA1C    Assessment & Plan:   Problem List Items Addressed This Visit  Other   Constipation    otc stool softener and miralax until relief.  Increase water intake Bland diet to allow for bowerl rest       Abdominal bloating    Suspected due to constipation  Pt declines kub today  Will work on treating constipation to see if relief        Relevant Medications   omeprazole (PRILOSEC) 20 MG capsule   Rib pain on left side - Primary    Ice to site.  Rest from lifting anything heavy, suspected costochronditis       Heart burn    Trial omeprazole 20 mg once daily Try to decrease and or avoid spicy foods, fried fatty foods, and also caffeine and chocolate as these can increase heartburn symptoms.          Meds ordered this encounter  Medications   omeprazole (PRILOSEC) 20 MG capsule    Sig: Take 1 capsule (20 mg total) by mouth daily.    Dispense:  30 capsule    Refill:  0    Order Specific Question:   Supervising Provider    Answer:   BEDSOLE, AMY E [[id number]]     Follow-up: Return if symptoms worsen or fail to improve with pcp.    Eugenia Pancoast, FNP

## 2021-12-11 NOTE — Assessment & Plan Note (Signed)
Ice to site.  Rest from lifting anything heavy, suspected costochronditis

## 2021-12-11 NOTE — Patient Instructions (Addendum)
Recommend daily stool softener such as colace  Recommend daily miralax until bowel movement relief.   Could also try a daily probiotic.   Due to recent changes in healthcare laws, you may see results of your imaging and/or laboratory studies on MyChart before I have had a chance to review them.  I understand that in some cases there may be results that are confusing or concerning to you. Please understand that not all results are received at the same time and often I may need to interpret multiple results in order to provide you with the best plan of care or course of treatment. Therefore, I ask that you please give me 2 business days to thoroughly review all your results before contacting my office for clarification. Should we see a critical lab result, you will be contacted sooner.   It was a pleasure seeing you today! Please do not hesitate to reach out with any questions and or concerns.  Regards,   Eugenia Pancoast FNP-C

## 2022-01-19 ENCOUNTER — Encounter: Payer: Self-pay | Admitting: Family Medicine

## 2022-01-19 ENCOUNTER — Ambulatory Visit (INDEPENDENT_AMBULATORY_CARE_PROVIDER_SITE_OTHER): Payer: PPO | Admitting: Family Medicine

## 2022-01-19 VITALS — BP 124/72 | HR 67 | Temp 97.7°F | Ht 64.0 in | Wt 196.1 lb

## 2022-01-19 DIAGNOSIS — R5383 Other fatigue: Secondary | ICD-10-CM | POA: Diagnosis not present

## 2022-01-19 DIAGNOSIS — S46212A Strain of muscle, fascia and tendon of other parts of biceps, left arm, initial encounter: Secondary | ICD-10-CM | POA: Diagnosis not present

## 2022-01-19 NOTE — Assessment & Plan Note (Signed)
1 month of L upper arm pain with exam suspicious for L biceps tendinopathy - discussed with patient, reviewed conservative management strategies. rec NSAID, heat, exercises provided from [facility] pt advisor. Consider PT referral vs sports med eval if not improving with above. Pt agrees with plan.

## 2022-01-19 NOTE — Assessment & Plan Note (Addendum)
Of unclear cause. Reassuring exam.  ESS = 1 - low risk.  Update if new or worsening symptoms.

## 2022-01-19 NOTE — Progress Notes (Signed)
Patient ID: Yolanda Walsh, female    DOB: [birth year], 71 y.o.   MRN: [id number]  This visit was conducted in person.  BP 124/72   Pulse 67   Temp 97.7 F (36.5 C) (Temporal)   Ht '5\' 4"'$  (1.626 m)   Wt 196 lb 2 oz (89 kg)   SpO2 95%   BMI 33.66 kg/m    CC: L shoulder pain, fatigue Subjective:   HPI: Yolanda Walsh is a 71 y.o. female presenting on 01/19/2022 for Shoulder Pain (C/o L shoulder pain.  Started about 2 wks ago after arm being jerked when walking dog. ) and Fatigue (C/o feeling tired daily.  Started about 6 mos ago. )   DOI: 1 mo ago while walking son's 70lb lab - arm got jerked forward.  Points to anterior L shoulder, describes constant dull ache, worse when lifting arm past midline.  Tried advil ('400mg'$  at night) at home as well as heating pad. Still able to [employer].  No neck pain, no numbness/weakness, shooting pain down arm.  Ongoing fatigue over the past 6 months. "Tired all the time". Daytime somnolence. Non-restorative sleep. Daily naps. No PNdyspnea. Unsure if she snores. No unexpected weight changes, night sweats, chest pain or dyspnea or abd pain, nausea.   Daughter just moved from [place], lived with her temporarily.      Relevant past medical, surgical, family and social history reviewed and updated as indicated. Interim medical history since our last visit reviewed. Allergies and medications reviewed and updated. Outpatient Medications Prior to Visit  Medication Sig Dispense Refill   Cholecalciferol (VITAMIN D3) 1000 UNITS CAPS Take 1 capsule (1,000 Units total) by mouth daily. 30 capsule    fexofenadine (ALLEGRA) 180 MG tablet Take 180 mg by mouth daily.     hydrochlorothiazide (HYDRODIURIL) 12.5 MG tablet Take 0.5 tablets (6.25 mg total) by mouth daily as needed (meniere's). 45 tablet 3   ibuprofen (ADVIL,MOTRIN) 200 MG tablet Take 200 mg by mouth every 6 (six) hours as needed for mild pain or moderate pain.     meclizine (ANTIVERT) 25 MG tablet  Take 1 tablet (25 mg total) by mouth 3 (three) times daily as needed for dizziness. 30 tablet 0   Multiple Vitamins-Minerals (PRESERVISION AREDS 2+MULTI VIT PO) Take 2 capsules by mouth daily.     rosuvastatin (CRESTOR) 5 MG tablet Take 1 tablet (5 mg total) by mouth every Monday, Wednesday, and Friday. 40 tablet 3   sertraline (ZOLOFT) 50 MG tablet Take 1 tablet (50 mg total) by mouth daily. 90 tablet 3   omeprazole (PRILOSEC) 20 MG capsule Take 1 capsule (20 mg total) by mouth daily. 30 capsule 0   No facility-administered medications prior to visit.     Per HPI unless specifically indicated in ROS section below Review of Systems  Objective:  BP 124/72   Pulse 67   Temp 97.7 F (36.5 C) (Temporal)   Ht '5\' 4"'$  (1.626 m)   Wt 196 lb 2 oz (89 kg)   SpO2 95%   BMI 33.66 kg/m   Wt Readings from Last 3 Encounters:  01/19/22 196 lb 2 oz (89 kg)  12/11/21 194 lb 8 oz (88.2 kg)  11/23/21 195 lb (88.5 kg)      Physical Exam Vitals and nursing note reviewed.  Constitutional:      Appearance: Normal appearance. She is not ill-appearing.  HENT:     Head: Normocephalic and atraumatic.     Mouth/Throat:  Mouth: Mucous membranes are moist.     Pharynx: Oropharynx is clear. No oropharyngeal exudate or posterior oropharyngeal erythema.  Eyes:     Extraocular Movements: Extraocular movements intact.     Pupils: Pupils are equal, round, and reactive to light.  Neck:     Thyroid: No thyroid mass or thyromegaly.  Cardiovascular:     Rate and Rhythm: Normal rate and regular rhythm.     Pulses: Normal pulses.     Heart sounds: Normal heart sounds. No murmur heard. Pulmonary:     Effort: Pulmonary effort is normal. No respiratory distress.     Breath sounds: Normal breath sounds. No wheezing, rhonchi or rales.  Abdominal:     General: Bowel sounds are normal. There is no distension.     Palpations: Abdomen is soft. There is no mass.     Tenderness: There is no abdominal tenderness.  There is no guarding or rebound.     Hernia: No hernia is present.  Musculoskeletal:     Right lower leg: No edema.     Left lower leg: No edema.     Comments:  FROM cervical spine No midline cervical spine tenderness  R shoulder WNL L shoulder exam: No deformity of shoulders on inspection. No pain with palpation of shoulder landmarks. FROM in abduction and forward flexion. Mild discomfort with testing SITS in ext/int rotation. + pain with empty can sign. ++ pain with Speed test. No impingement. No pain with crossover test. No significant pain with rotation of humeral head in [place] joint.   Skin:    General: Skin is warm and dry.     Findings: No rash.  Neurological:     Mental Status: She is alert.  Psychiatric:        Mood and Affect: Mood normal.        Behavior: Behavior normal.       Lab Results  Component Value Date   CREATININE 0.80 06/18/2021   BUN 16 06/18/2021   NA 138 06/18/2021   K 4.3 06/18/2021   CL 102 06/18/2021   CO2 27 06/18/2021    Assessment & Plan:   Problem List Items Addressed This Visit     Strain of left biceps tendon - Primary    1 month of L upper arm pain with exam suspicious for L biceps tendinopathy - discussed with patient, reviewed conservative management strategies. rec NSAID, heat, exercises provided from [facility] pt advisor. Consider PT referral vs sports med eval if not improving with above. Pt agrees with plan.       Fatigue    Of unclear cause. Reassuring exam.  ESS = 1 - low risk.  Update if new or worsening symptoms.         No orders of the defined types were placed in this encounter.  No orders of the defined types were placed in this encounter.   Patient Instructions  I think you have biceps tendon strain or partial tear - do exercises provided today. Ok to continue advil '400mg'$  up to three times daily with meals for the next week. May use heating pad to tender area as well. If not improving with this, schedule appointment  with Dr Lorelei Pont sports medicine.   Follow up plan: Return if symptoms worsen or fail to improve.  Ria Bush, MD

## 2022-01-19 NOTE — Patient Instructions (Addendum)
I think you have biceps tendon strain or partial tear - do exercises provided today. Ok to continue advil '400mg'$  up to three times daily with meals for the next week. May use heating pad to tender area as well. If not improving with this, schedule appointment with Dr Lorelei Pont sports medicine.

## 2022-02-12 ENCOUNTER — Telehealth: Payer: Self-pay

## 2022-02-12 NOTE — Progress Notes (Signed)
    Chronic Care Management Pharmacy Assistant   Name: Yolanda Walsh  MRN: [id number] DOB: 1950-12-02  Reason for Encounter: CCM (General Adherence)   Recent office visits:  01/19/22 Ria Bush, MD Strain of left biceps tendon Stop: Omeprazole 20 mg  Recent consult visits:  11/23/21 Colonoscopy  Hospital visits:  None in previous 6 months  Medications: Outpatient Encounter Medications as of 02/12/2022  Medication Sig   Cholecalciferol (VITAMIN D3) 1000 UNITS CAPS Take 1 capsule (1,000 Units total) by mouth daily.   fexofenadine (ALLEGRA) 180 MG tablet Take 180 mg by mouth daily.   hydrochlorothiazide (HYDRODIURIL) 12.5 MG tablet Take 0.5 tablets (6.25 mg total) by mouth daily as needed (meniere's).   ibuprofen (ADVIL,MOTRIN) 200 MG tablet Take 200 mg by mouth every 6 (six) hours as needed for mild pain or moderate pain.   meclizine (ANTIVERT) 25 MG tablet Take 1 tablet (25 mg total) by mouth 3 (three) times daily as needed for dizziness.   Multiple Vitamins-Minerals (PRESERVISION AREDS 2+MULTI VIT PO) Take 2 capsules by mouth daily.   rosuvastatin (CRESTOR) 5 MG tablet Take 1 tablet (5 mg total) by mouth every Monday, Wednesday, and Friday.   sertraline (ZOLOFT) 50 MG tablet Take 1 tablet (50 mg total) by mouth daily.   No facility-administered encounter medications on file as of 02/12/2022.   [place] on 02/15/22 for general disease state and medication adherence call.   Patient is not more than 5 days past due for refill on the following medications per chart history:  [facility] Medications: Medication Name/mg Last Fill Days Supply Rosuvastatin 5 mg  01/19/2022 90  What concerns do you have about your medications? No concerns at this time  The patient denies side effects with their medications.   How often do you forget or accidentally miss a dose? Never  Do you use a pillbox? Yes  Are you having any problems getting your medications from your pharmacy?  No  Has the cost of your medications been a concern? No  Since last visit with CPP, no interventions have been made.   The patient has not had an ED visit since last contact.   The patient denies problems with their health.   Patient denies concerns or questions for Charlene Brooke, PharmD at this time.   Care Gaps: Annual wellness visit in last year? Yes 06/23/2021 Most Recent BP reading: 124/72 on 01/19/2022  Upcoming appointments: CCM appointment on 06/17/2022  Charlene Brooke, CPP notified  Marijean Niemann, [place] Assistant [phone]

## 2022-03-19 DIAGNOSIS — H353132 Nonexudative age-related macular degeneration, bilateral, intermediate dry stage: Secondary | ICD-10-CM | POA: Diagnosis not present

## 2022-03-27 ENCOUNTER — Telehealth: Payer: Self-pay | Admitting: Family Medicine

## 2022-03-27 DIAGNOSIS — M85852 Other specified disorders of bone density and structure, left thigh: Secondary | ICD-10-CM

## 2022-03-27 NOTE — Telephone Encounter (Signed)
Plz notify I've ordered DEXA to [facility] for when she gets mammogram later this year. Please fax order to [facility].  She may call [facility] to schedule bone density scan: [facility] in [place] [phone]

## 2022-03-29 ENCOUNTER — Telehealth: Payer: Self-pay

## 2022-03-29 NOTE — Telephone Encounter (Signed)
It is eligible for adults over 71 years of age.  I would recommend it for anyone with chronic respiratory cardiovascular or kidney issues.  She doesn't have any of these issues so she should do ok without it, but if she does want it, would also be reasonable to receive vaccine at pharmacy as we don't have it yet.

## 2022-03-29 NOTE — Telephone Encounter (Signed)
Pt is asking what the recommendation is for RSV shot for her age group.  Also asking if she should get it. Plz advise.

## 2022-03-29 NOTE — Telephone Encounter (Signed)
Faxed signed order to [facility] at [phone].  Spoke with pt relaying Dr. Synthia Innocent message.  Pt expresses her thanks.

## 2022-03-30 ENCOUNTER — Ambulatory Visit (INDEPENDENT_AMBULATORY_CARE_PROVIDER_SITE_OTHER): Payer: PPO

## 2022-03-30 DIAGNOSIS — Z23 Encounter for immunization: Secondary | ICD-10-CM

## 2022-03-30 NOTE — Telephone Encounter (Signed)
Patient notified as instructed by telephone and verbalized understanding. 

## 2022-04-21 ENCOUNTER — Other Ambulatory Visit: Payer: Self-pay

## 2022-04-21 MED ORDER — COVID-19 MRNA 2023-2024 VACCINE (COMIRNATY) 0.3 ML INJECTION
INTRAMUSCULAR | 0 refills | Status: DC
  Filled 2022-04-22: qty 0.3, 1d supply, fill #0

## 2022-04-22 ENCOUNTER — Other Ambulatory Visit: Payer: Self-pay

## 2022-04-27 DIAGNOSIS — L900 Lichen sclerosus et atrophicus: Secondary | ICD-10-CM | POA: Diagnosis not present

## 2022-04-27 DIAGNOSIS — Z01419 Encounter for gynecological examination (general) (routine) without abnormal findings: Secondary | ICD-10-CM | POA: Diagnosis not present

## 2022-05-06 DIAGNOSIS — M8589 Other specified disorders of bone density and structure, multiple sites: Secondary | ICD-10-CM | POA: Diagnosis not present

## 2022-05-06 DIAGNOSIS — Z780 Asymptomatic menopausal state: Secondary | ICD-10-CM | POA: Diagnosis not present

## 2022-05-06 DIAGNOSIS — M810 Age-related osteoporosis without current pathological fracture: Secondary | ICD-10-CM | POA: Diagnosis not present

## 2022-05-06 DIAGNOSIS — Z1231 Encounter for screening mammogram for malignant neoplasm of breast: Secondary | ICD-10-CM | POA: Diagnosis not present

## 2022-05-06 LAB — HM DEXA SCAN

## 2022-05-06 LAB — HM MAMMOGRAPHY

## 2022-05-07 NOTE — Telephone Encounter (Signed)
Plz notify DEXA scan returned showing progression into osteoporosis range. Would offer OV to review treatment options.

## 2022-05-10 NOTE — Telephone Encounter (Signed)
Spoke with pt relaying results and Dr. Synthia Innocent message.  Pt verbalizes understanding and scheduled OV tomorrow at 8:00.

## 2022-05-11 ENCOUNTER — Telehealth: Payer: Self-pay | Admitting: Family Medicine

## 2022-05-11 ENCOUNTER — Ambulatory Visit (INDEPENDENT_AMBULATORY_CARE_PROVIDER_SITE_OTHER): Payer: PPO | Admitting: Family Medicine

## 2022-05-11 ENCOUNTER — Other Ambulatory Visit (INDEPENDENT_AMBULATORY_CARE_PROVIDER_SITE_OTHER): Payer: PPO

## 2022-05-11 ENCOUNTER — Encounter: Payer: Self-pay | Admitting: Family Medicine

## 2022-05-11 VITALS — BP 120/64 | HR 69 | Temp 97.4°F | Ht 64.0 in | Wt 196.1 lb

## 2022-05-11 DIAGNOSIS — M810 Age-related osteoporosis without current pathological fracture: Secondary | ICD-10-CM | POA: Diagnosis not present

## 2022-05-11 LAB — BASIC METABOLIC PANEL
BUN: 19 mg/dL (ref 6–23)
CO2: 29 mEq/L (ref 19–32)
Calcium: 9.1 mg/dL (ref 8.4–10.5)
Chloride: 104 mEq/L (ref 96–112)
Creatinine, Ser: 0.9 mg/dL (ref 0.40–1.20)
GFR: 64.31 mL/min (ref >60.00)
Glucose, Bld: 97 mg/dL (ref 70–99)
Potassium: 3.8 mEq/L (ref 3.5–5.1)
Sodium: 139 mEq/L (ref 135–145)

## 2022-05-11 NOTE — Patient Instructions (Addendum)
Try to get most or all of your calcium from your food--aim for 1200 mg/day  To figure out dietary calcium: 300 mg/day from all non dairy foods plus 300 mg per cup of milk, other dairy, or fortified juice. Non dairy foods that contain calcium: Kale, oranges, sardines, oatmeal, soy milk/soybeans, salmon, white beans, dried figs, turnip greens, almonds, broccoli, tofu.   Continue vitamin D 1000 units daily.   Work on regular weight bearing exercise.   We will price out prolia for you and be in touch with cost.   Denosumab Injection (Osteoporosis) What is this medication? DENOSUMAB (den oh SUE mab) prevents and treats osteoporosis. It works by Paramedic stronger and less likely to break (fracture). It is a monoclonal antibody. This medicine may be used for other purposes; ask your health care provider or pharmacist if you have questions. COMMON BRAND NAME(S): Prolia What should I tell my care team before I take this medication? They need to know if you have any of these conditions: Dental or gum disease, or plan to have dental surgery or a tooth pulled Infection Kidney disease Low levels of calcium or vitamin D in your blood On dialysis Poor nutrition Skin conditions Thyroid disease, or have had thyroid or parathyroid surgery Trouble absorbing minerals in your stomach or intestine An unusual or allergic reaction to denosumab, other medications, foods, dyes, or preservatives Pregnant or trying to get pregnant Breastfeeding How should I use this medication? This medication is injected under the skin. It is given by your care team in a hospital or clinic setting. A special MedGuide will be given to you before each treatment. Be sure to read this information carefully each time. Talk to your care team about the use of this medication in children. Special care may be needed. Overdosage: If you think you have taken too much of this medicine contact a poison control center or  emergency room at once. NOTE: This medicine is only for you. Do not share this medicine with others. What if I miss a dose? Keep appointments for follow-up doses. It is important not to miss your dose. Call your care team if you are unable to keep an appointment. What may interact with this medication? Do not take this medication with any of the following: Other medications that contain denosumab This medication may also interact with the following: Medications that lower your chance of fighting infection Steroid medications, such as prednisone or cortisone This list may not describe all possible interactions. Give your health care provider a list of all the medicines, herbs, non-prescription drugs, or dietary supplements you use. Also tell them if you smoke, drink alcohol, or use illegal drugs. Some items may interact with your medicine. What should I watch for while using this medication? Your condition will be monitored carefully while you are receiving this medication. You may need blood work while taking this medication. This medication may increase your risk of getting an infection. Call your care team for advice if you get a fever, chills, sore throat, or other symptoms of a cold or flu. Do not treat yourself. Try to avoid being around people who are sick. Tell your dentist and dental surgeon that you are taking this medication. You should not have major dental surgery while on this medication. See your dentist to have a dental exam and fix any dental problems before starting this medication. Take good care of your teeth while on this medication. Make sure you see your dentist for regular  follow-up appointments. You should make sure you get enough calcium and vitamin D while you are taking this medication. Discuss the foods you eat and the vitamins you take with your care team. Talk to your care team if you are pregnant or think you might be pregnant. This medication can cause serious birth  defects if taken during pregnancy and for 5 months after the last dose. You will need a negative pregnancy test before starting this medication. Contraception is recommended while taking this medication and for 5 months after the last dose. Your care team can help you find the option that works for you. Talk to your care team before breastfeeding. Changes to your treatment plan may be needed. What side effects may I notice from receiving this medication? Side effects that you should report to your care team as soon as possible: Allergic reactions--skin rash, itching, hives, swelling of the face, lips, tongue, or throat Infection--fever, chills, cough, sore throat, wounds that don't heal, pain or trouble when passing urine, general feeling of discomfort or being unwell Low calcium level--muscle pain or cramps, confusion, tingling, or numbness in the hands or feet Osteonecrosis of the jaw--pain, swelling, or redness in the mouth, numbness of the jaw, poor healing after dental work, unusual discharge from the mouth, visible bones in the mouth Severe bone, joint, or muscle pain Skin infection--skin redness, swelling, warmth, or pain Side effects that usually do not require medical attention (report these to your care team if they continue or are bothersome): Back pain Headache Joint pain Muscle pain Pain in the hands, arms, legs, or feet Runny or stuffy nose Sore throat This list may not describe all possible side effects. Call your doctor for medical advice about side effects. You may report side effects to FDA at 1-800-FDA-1088. Where should I keep my medication? This medication is given in a hospital or clinic. It will not be stored at home. NOTE: This sheet is a summary. It may not cover all possible information. If you have questions about this medicine, talk to your doctor, pharmacist, or health care provider.  2023 Elsevier/Gold Standard (2021-11-09 00:00:00)

## 2022-05-11 NOTE — Assessment & Plan Note (Signed)
Reviewed latest DEXA as well as treatment options specifically bisphophonate and prolia. Reviewed mechanism of action of medication as well as risks/adverse effects to monitor for including atypical fracture of the hip. Her sister takes prolia and she is interested in this Q6 month injection. Will have my office price out prolia for patient. Prolia handout provided. Also reviewed calcium intake, regular vitamin D supplementation ,and regular weight bearing exercise recommendation.

## 2022-05-11 NOTE — Progress Notes (Signed)
Patient ID: Yolanda Walsh, female    DOB: 29-Mar-1951, 70 y.o.   MRN: [id number]  This visit was conducted in person.  BP 120/64   Pulse 69   Temp (!) 97.4 F (36.3 C) (Temporal)   Ht '5\' 4"'$  (1.626 m)   Wt 196 lb 2 oz (89 kg)   SpO2 95%   BMI 33.66 kg/m    CC: discuss osteoporosis treatment  Subjective:   HPI: Yolanda Walsh is a 71 y.o. female presenting on 05/11/2022 for Osteoporosis (Here to discuss tx options. )   Recent DEXA at [facility] showing T -2.6 L fem neck, -2.3 spine (04/2022) This is slightly progressed since last DEXA 05/2019 (hip -2.4).  She does regularly take vitamin D 1000 IU daily as well as good calcium in the diet.  Her sister has osteoporosis.  No significant h/o GERD.   She's had bilateral ankle fractures after falls remotely.   Known meniere's managed on hctz 6.'25mg'$  QD.   No h/o thyroid disease.  Lab Results  Component Value Date   TSH 3.11 06/19/2020        Relevant past medical, surgical, family and social history reviewed and updated as indicated. Interim medical history since our last visit reviewed. Allergies and medications reviewed and updated. Outpatient Medications Prior to Visit  Medication Sig Dispense Refill   Cholecalciferol (VITAMIN D3) 1000 UNITS CAPS Take 1 capsule (1,000 Units total) by mouth daily. 30 capsule    COVID-19 mRNA vaccine 2023-2024 (COMIRNATY) SUSP injection Inject into the muscle. 0.3 mL 0   fexofenadine (ALLEGRA) 180 MG tablet Take 180 mg by mouth daily.     hydrochlorothiazide (HYDRODIURIL) 12.5 MG tablet Take 0.5 tablets (6.25 mg total) by mouth daily as needed (meniere's). 45 tablet 3   ibuprofen (ADVIL,MOTRIN) 200 MG tablet Take 200 mg by mouth every 6 (six) hours as needed for mild pain or moderate pain.     meclizine (ANTIVERT) 25 MG tablet Take 1 tablet (25 mg total) by mouth 3 (three) times daily as needed for dizziness. 30 tablet 0   Multiple Vitamins-Minerals (PRESERVISION AREDS 2+MULTI VIT PO) Take 2  capsules by mouth daily.     rosuvastatin (CRESTOR) 5 MG tablet Take 1 tablet (5 mg total) by mouth every Monday, Wednesday, and Friday. 40 tablet 3   sertraline (ZOLOFT) 50 MG tablet Take 1 tablet (50 mg total) by mouth daily. 90 tablet 3   No facility-administered medications prior to visit.     Per HPI unless specifically indicated in ROS section below Review of Systems  Objective:  BP 120/64   Pulse 69   Temp (!) 97.4 F (36.3 C) (Temporal)   Ht '5\' 4"'$  (1.626 m)   Wt 196 lb 2 oz (89 kg)   SpO2 95%   BMI 33.66 kg/m   Wt Readings from Last 3 Encounters:  05/11/22 196 lb 2 oz (89 kg)  01/19/22 196 lb 2 oz (89 kg)  12/11/21 194 lb 8 oz (88.2 kg)      Physical Exam    Results for orders placed or performed in visit on 06/18/21  CBC with Differential/Platelet  Result Value Ref Range   WBC 7.0 4.0 - 10.5 K/uL   RBC 4.33 3.87 - 5.11 Mil/uL   Hemoglobin 11.7 (L) 12.0 - 15.0 g/dL   HCT 36.3 36.0 - 46.0 %   MCV 83.8 78.0 - 100.0 fl   MCHC 32.3 30.0 - 36.0 g/dL   RDW 14.3 11.5 -  15.5 %   Platelets 249.0 150.0 - 400.0 K/uL   Neutrophils Relative % 66.7 43.0 - 77.0 %   Lymphocytes Relative 24.9 12.0 - 46.0 %   Monocytes Relative 7.2 3.0 - 12.0 %   Eosinophils Relative 0.7 0.0 - 5.0 %   Basophils Relative 0.5 0.0 - 3.0 %   Neutro Abs 4.6 1.4 - 7.7 K/uL   Lymphs Abs 1.7 0.7 - 4.0 K/uL   Monocytes Absolute 0.5 0.1 - 1.0 K/uL   Eosinophils Absolute 0.1 0.0 - 0.7 K/uL   Basophils Absolute 0.0 0.0 - 0.1 K/uL  Lipid panel  Result Value Ref Range   Cholesterol 197 0 - 200 mg/dL   Triglycerides 121.0 0.0 - 149.0 mg/dL   HDL 55.30 >39.00 mg/dL   VLDL 24.2 0.0 - 40.0 mg/dL   LDL Cholesterol 118 (H) 0 - 99 mg/dL   Total CHOL/HDL Ratio 4    NonHDL 141.87   VITAMIN D 25 Hydroxy (Vit-D Deficiency, Fractures)  Result Value Ref Range   VITD 40.67 30.00 - 100.00 ng/mL  Comprehensive metabolic panel  Result Value Ref Range   Sodium 138 135 - 145 mEq/L   Potassium 4.3 3.5 - 5.1 mEq/L    Chloride 102 96 - 112 mEq/L   CO2 27 19 - 32 mEq/L   Glucose, Bld 94 70 - 99 mg/dL   BUN 16 6 - 23 mg/dL   Creatinine, Ser 0.80 0.40 - 1.20 mg/dL   Total Bilirubin 0.6 0.2 - 1.2 mg/dL   Alkaline Phosphatase 72 39 - 117 U/L   AST 19 0 - 37 U/L   ALT 13 0 - 35 U/L   Total Protein 6.9 6.0 - 8.3 g/dL   Albumin 4.1 3.5 - 5.2 g/dL   GFR 74.54 >60.00 mL/min   Calcium 9.6 8.4 - 10.5 mg/dL    Assessment & Plan:   Problem List Items Addressed This Visit     Osteoporosis    Reviewed latest DEXA as well as treatment options specifically bisphophonate and prolia. Reviewed mechanism of action of medication as well as risks/adverse effects to monitor for including atypical fracture of the hip. Her sister takes prolia and she is interested in this Q6 month injection. Will have my office price out prolia for patient. Prolia handout provided. Also reviewed calcium intake, regular vitamin D supplementation ,and regular weight bearing exercise recommendation.         No orders of the defined types were placed in this encounter.  No orders of the defined types were placed in this encounter.   Patient instructions: Try to get most or all of your calcium from your food--aim for 1200 mg/day  To figure out dietary calcium: 300 mg/day from all non dairy foods plus 300 mg per cup of milk, other dairy, or fortified juice. Non dairy foods that contain calcium: Kale, oranges, sardines, oatmeal, soy milk/soybeans, salmon, white beans, dried figs, turnip greens, almonds, broccoli, tofu.   Continue vitamin D 1000 units daily.   Work on regular weight bearing exercise.   We will price out prolia for you and be in touch with cost.   Follow up plan: Return if symptoms worsen or fail to improve.  Ria Bush, MD

## 2022-05-11 NOTE — Telephone Encounter (Signed)
Left message to call back Estimated cost is $315

## 2022-05-11 NOTE — Telephone Encounter (Signed)
Spoke with patient today - she's interested in prolia - can we price out for her? Thanks!

## 2022-05-11 NOTE — Addendum Note (Signed)
Addended by: Kris Mouton on: 05/11/2022 12:19 PM   Modules accepted: Orders

## 2022-05-11 NOTE — Telephone Encounter (Signed)
Patient advised and ok to proceed. Lab today 05/11/22 and NV 05/18/22.

## 2022-05-12 ENCOUNTER — Encounter: Payer: Self-pay | Admitting: Family Medicine

## 2022-05-17 NOTE — Telephone Encounter (Signed)
Lab 10/31 CrCl is 80.55 mL/min. Calcium 9.1

## 2022-05-18 ENCOUNTER — Ambulatory Visit (INDEPENDENT_AMBULATORY_CARE_PROVIDER_SITE_OTHER): Payer: PPO

## 2022-05-18 DIAGNOSIS — M85852 Other specified disorders of bone density and structure, left thigh: Secondary | ICD-10-CM | POA: Diagnosis not present

## 2022-05-18 MED ORDER — DENOSUMAB 60 MG/ML ~~LOC~~ SOSY
60.0000 mg | PREFILLED_SYRINGE | Freq: Once | SUBCUTANEOUS | Status: AC
  Administered 2022-05-18: 60 mg via SUBCUTANEOUS

## 2022-05-18 NOTE — Progress Notes (Signed)
Per orders of Dr. Ria Bush, injection of Prolia 60 mg [place] right arm given by Ozzie Hoyle. Patient tolerated injection well.

## 2022-05-21 ENCOUNTER — Other Ambulatory Visit: Payer: Self-pay

## 2022-05-21 ENCOUNTER — Other Ambulatory Visit (HOSPITAL_BASED_OUTPATIENT_CLINIC_OR_DEPARTMENT_OTHER): Payer: Self-pay

## 2022-06-17 ENCOUNTER — Telehealth: Payer: PPO

## 2022-06-20 ENCOUNTER — Other Ambulatory Visit: Payer: Self-pay | Admitting: Family Medicine

## 2022-06-20 DIAGNOSIS — M810 Age-related osteoporosis without current pathological fracture: Secondary | ICD-10-CM

## 2022-06-20 DIAGNOSIS — D649 Anemia, unspecified: Secondary | ICD-10-CM

## 2022-06-20 DIAGNOSIS — E559 Vitamin D deficiency, unspecified: Secondary | ICD-10-CM

## 2022-06-20 DIAGNOSIS — E785 Hyperlipidemia, unspecified: Secondary | ICD-10-CM

## 2022-06-21 ENCOUNTER — Other Ambulatory Visit (INDEPENDENT_AMBULATORY_CARE_PROVIDER_SITE_OTHER): Payer: PPO

## 2022-06-21 DIAGNOSIS — E785 Hyperlipidemia, unspecified: Secondary | ICD-10-CM

## 2022-06-21 DIAGNOSIS — E559 Vitamin D deficiency, unspecified: Secondary | ICD-10-CM

## 2022-06-21 DIAGNOSIS — D649 Anemia, unspecified: Secondary | ICD-10-CM

## 2022-06-21 DIAGNOSIS — M810 Age-related osteoporosis without current pathological fracture: Secondary | ICD-10-CM | POA: Diagnosis not present

## 2022-06-21 LAB — LIPID PANEL
Cholesterol: 168 mg/dL (ref 0–200)
HDL: 47 mg/dL (ref >39.00)
LDL Cholesterol: 93 mg/dL (ref 0–99)
NonHDL: 120.5
Total CHOL/HDL Ratio: 4
Triglycerides: 139 mg/dL (ref 0.0–149.0)
VLDL: 27.8 mg/dL (ref 0.0–40.0)

## 2022-06-21 LAB — CBC WITH DIFFERENTIAL/PLATELET
Basophils Absolute: 0 10*3/uL (ref 0.0–0.1)
Basophils Relative: 0.4 % (ref 0.0–3.0)
Eosinophils Absolute: 0.1 10*3/uL (ref 0.0–0.7)
Eosinophils Relative: 0.8 % (ref 0.0–5.0)
HCT: 34.1 % — ABNORMAL LOW (ref 36.0–46.0)
Hemoglobin: 11.3 g/dL — ABNORMAL LOW (ref 12.0–15.0)
Lymphocytes Relative: 34 % (ref 12.0–46.0)
Lymphs Abs: 2.5 10*3/uL (ref 0.7–4.0)
MCHC: 33.2 g/dL (ref 30.0–36.0)
MCV: 80.2 fl (ref 78.0–100.0)
Monocytes Absolute: 0.6 10*3/uL (ref 0.1–1.0)
Monocytes Relative: 8.2 % (ref 3.0–12.0)
Neutro Abs: 4.2 10*3/uL (ref 1.4–7.7)
Neutrophils Relative %: 56.6 % (ref 43.0–77.0)
Platelets: 286 10*3/uL (ref 150.0–400.0)
RBC: 4.26 Mil/uL (ref 3.87–5.11)
RDW: 15.6 % — ABNORMAL HIGH (ref 11.5–15.5)
WBC: 7.4 10*3/uL (ref 4.0–10.5)

## 2022-06-21 LAB — COMPREHENSIVE METABOLIC PANEL
ALT: 9 U/L (ref 0–35)
AST: 14 U/L (ref 0–37)
Albumin: 4 g/dL (ref 3.5–5.2)
Alkaline Phosphatase: 60 U/L (ref 39–117)
BUN: 16 mg/dL (ref 6–23)
CO2: 28 mEq/L (ref 19–32)
Calcium: 9 mg/dL (ref 8.4–10.5)
Chloride: 103 mEq/L (ref 96–112)
Creatinine, Ser: 0.74 mg/dL (ref 0.40–1.20)
GFR: 81.27 mL/min (ref >60.00)
Glucose, Bld: 97 mg/dL (ref 70–99)
Potassium: 3.9 mEq/L (ref 3.5–5.1)
Sodium: 139 mEq/L (ref 135–145)
Total Bilirubin: 0.4 mg/dL (ref 0.2–1.2)
Total Protein: 6.8 g/dL (ref 6.0–8.3)

## 2022-06-21 LAB — TSH: TSH: 4.36 u[IU]/mL (ref 0.35–5.50)

## 2022-06-21 LAB — VITAMIN D 25 HYDROXY (VIT D DEFICIENCY, FRACTURES): VITD: 55.6 ng/mL (ref 30.00–100.00)

## 2022-06-24 ENCOUNTER — Telehealth: Payer: Self-pay

## 2022-06-24 NOTE — Progress Notes (Signed)
    Chronic Care Management Pharmacy Assistant   Name: Yolanda Walsh  MRN: [id number] DOB: 1950/12/04  Reason for Encounter: CCM (Appointment Reminder)  Medications: Outpatient Encounter Medications as of 06/24/2022  Medication Sig   Cholecalciferol (VITAMIN D3) 1000 UNITS CAPS Take 1 capsule (1,000 Units total) by mouth daily.   COVID-19 mRNA vaccine 2023-2024 (COMIRNATY) SUSP injection Inject into the muscle.   fexofenadine (ALLEGRA) 180 MG tablet Take 180 mg by mouth daily.   hydrochlorothiazide (HYDRODIURIL) 12.5 MG tablet Take 0.5 tablets (6.25 mg total) by mouth daily as needed (meniere's).   ibuprofen (ADVIL,MOTRIN) 200 MG tablet Take 200 mg by mouth every 6 (six) hours as needed for mild pain or moderate pain.   meclizine (ANTIVERT) 25 MG tablet Take 1 tablet (25 mg total) by mouth 3 (three) times daily as needed for dizziness.   Multiple Vitamins-Minerals (PRESERVISION AREDS 2+MULTI VIT PO) Take 2 capsules by mouth daily.   rosuvastatin (CRESTOR) 5 MG tablet Take 1 tablet (5 mg total) by mouth every Monday, Wednesday, and Friday.   sertraline (ZOLOFT) 50 MG tablet Take 1 tablet (50 mg total) by mouth daily.   No facility-administered encounter medications on file as of 06/24/2022.   [name] was contacted to remind of upcoming telephone visit with Charlene Brooke on 06/29/2022 at 9:30. Patient was reminded to have any blood glucose and blood pressure readings available for review at appointment.   Message was left reminding patient of appointment.  CCM referral has been placed prior to visit?  No   Star Rating Drugs: Medication:  Last Fill: Day Supply Rosuvastatin 5 mg 04/20/22 [place], CPP notified  Marijean Niemann, [place] Pharmacy Assistant [phone]

## 2022-06-25 ENCOUNTER — Other Ambulatory Visit: Payer: Self-pay | Admitting: Family Medicine

## 2022-06-25 DIAGNOSIS — H8102 Meniere's disease, left ear: Secondary | ICD-10-CM

## 2022-06-28 ENCOUNTER — Ambulatory Visit (INDEPENDENT_AMBULATORY_CARE_PROVIDER_SITE_OTHER): Payer: PPO | Admitting: Family Medicine

## 2022-06-28 ENCOUNTER — Encounter: Payer: Self-pay | Admitting: Family Medicine

## 2022-06-28 VITALS — BP 122/64 | HR 71 | Temp 97.6°F | Ht 63.75 in | Wt 195.6 lb

## 2022-06-28 DIAGNOSIS — E785 Hyperlipidemia, unspecified: Secondary | ICD-10-CM

## 2022-06-28 DIAGNOSIS — E669 Obesity, unspecified: Secondary | ICD-10-CM

## 2022-06-28 DIAGNOSIS — H3530 Unspecified macular degeneration: Secondary | ICD-10-CM

## 2022-06-28 DIAGNOSIS — E559 Vitamin D deficiency, unspecified: Secondary | ICD-10-CM

## 2022-06-28 DIAGNOSIS — F411 Generalized anxiety disorder: Secondary | ICD-10-CM

## 2022-06-28 DIAGNOSIS — Z0000 Encounter for general adult medical examination without abnormal findings: Secondary | ICD-10-CM | POA: Diagnosis not present

## 2022-06-28 DIAGNOSIS — H9192 Unspecified hearing loss, left ear: Secondary | ICD-10-CM

## 2022-06-28 DIAGNOSIS — L989 Disorder of the skin and subcutaneous tissue, unspecified: Secondary | ICD-10-CM | POA: Insufficient documentation

## 2022-06-28 DIAGNOSIS — H8102 Meniere's disease, left ear: Secondary | ICD-10-CM

## 2022-06-28 DIAGNOSIS — M810 Age-related osteoporosis without current pathological fracture: Secondary | ICD-10-CM

## 2022-06-28 DIAGNOSIS — D649 Anemia, unspecified: Secondary | ICD-10-CM | POA: Insufficient documentation

## 2022-06-28 NOTE — Assessment & Plan Note (Signed)
Received first Prolia 05/2022, tolerated well. Will be due 11/2022.

## 2022-06-28 NOTE — Patient Instructions (Addendum)
You are doing well today. Return in 1 year for physical, sooner if needed Let me know if skin lesion on cheek not improving.   Health Maintenance After Age 71 After age 28, you are at a higher risk for certain long-term diseases and infections as well as injuries from falls. Falls are a major cause of broken bones and head injuries in people who are older than age 34. Getting regular preventive care can help to keep you healthy and well. Preventive care includes getting regular testing and making lifestyle changes as recommended by your health care provider. Talk with your health care provider about: Which screenings and tests you should have. A screening is a test that checks for a disease when you have no symptoms. A diet and exercise plan that is right for you. What should I know about screenings and tests to prevent falls? Screening and testing are the best ways to find a health problem early. Early diagnosis and treatment give you the best chance of managing medical conditions that are common after age 35. Certain conditions and lifestyle choices may make you more likely to have a fall. Your health care provider may recommend: Regular vision checks. Poor vision and conditions such as cataracts can make you more likely to have a fall. If you wear glasses, make sure to get your prescription updated if your vision changes. Medicine review. Work with your health care provider to regularly review all of the medicines you are taking, including over-the-counter medicines. Ask your health care provider about any side effects that may make you more likely to have a fall. Tell your health care provider if any medicines that you take make you feel dizzy or sleepy. Strength and balance checks. Your health care provider may recommend certain tests to check your strength and balance while standing, walking, or changing positions. Foot health exam. Foot pain and numbness, as well as not wearing proper footwear,  can make you more likely to have a fall. Screenings, including: Osteoporosis screening. Osteoporosis is a condition that causes the bones to get weaker and break more easily. Blood pressure screening. Blood pressure changes and medicines to control blood pressure can make you feel dizzy. Depression screening. You may be more likely to have a fall if you have a fear of falling, feel depressed, or feel unable to do activities that you used to do. Alcohol use screening. Using too much alcohol can affect your balance and may make you more likely to have a fall. Follow these instructions at home: Lifestyle Do not drink alcohol if: Your health care provider tells you not to drink. If you drink alcohol: Limit how much you have to: 0-1 drink a day for women. 0-2 drinks a day for men. Know how much alcohol is in your drink. In the [place] one drink equals one 12 oz bottle of beer (355 mL), one 5 oz glass of wine (148 mL), or one 1 oz glass of hard liquor (44 mL). Do not use any products that contain nicotine or tobacco. These products include cigarettes, chewing tobacco, and vaping devices, such as e-cigarettes. If you need help quitting, ask your health care provider. Activity  Follow a regular exercise program to stay fit. This will help you maintain your balance. Ask your health care provider what types of exercise are appropriate for you. If you need a cane or walker, use it as recommended by your health care provider. Wear supportive shoes that have nonskid soles. Safety  Remove any  tripping hazards, such as rugs, cords, and clutter. Install safety equipment such as grab bars in bathrooms and safety rails on stairs. Keep rooms and walkways well-lit. General instructions Talk with your health care provider about your risks for falling. Tell your health care provider if: You fall. Be sure to tell your health care provider about all falls, even ones that seem minor. You feel dizzy, tiredness  (fatigue), or off-balance. Take over-the-counter and prescription medicines only as told by your health care provider. These include supplements. Eat a healthy diet and maintain a healthy weight. A healthy diet includes low-fat dairy products, low-fat (lean) meats, and fiber from whole grains, beans, and lots of fruits and vegetables. Stay current with your vaccines. Schedule regular health, dental, and eye exams. Summary Having a healthy lifestyle and getting preventive care can help to protect your health and wellness after age 51. Screening and testing are the best way to find a health problem early and help you avoid having a fall. Early diagnosis and treatment give you the best chance for managing medical conditions that are more common for people who are older than age 75. Falls are a major cause of broken bones and head injuries in people who are older than age 21. Take precautions to prevent a fall at home. Work with your health care provider to learn what changes you can make to improve your health and wellness and to prevent falls. This information is not intended to replace advice given to you by your health care provider. Make sure you discuss any questions you have with your health care provider. Document Revised: 11/17/2020 Document Reviewed: 11/17/2020 Elsevier Patient Education  [place].

## 2022-06-28 NOTE — Assessment & Plan Note (Signed)
Mild, present over the past year. Check anemia panel next labwork.  UTD colonoscopy.

## 2022-06-28 NOTE — Assessment & Plan Note (Signed)
Stable period on daily sertraline '50mg'$  - continue this.

## 2022-06-28 NOTE — Assessment & Plan Note (Signed)
Regularly sees eye doctor.  

## 2022-06-28 NOTE — Assessment & Plan Note (Signed)
Continue vit D 1000 IU daily. 

## 2022-06-28 NOTE — Assessment & Plan Note (Signed)
Newly noted 1 wk ago, she's been picking at it. Advised daily vaseline or abx ointment and let me know if not improving as expected for derm eval to r/o skin cancer.

## 2022-06-28 NOTE — Assessment & Plan Note (Signed)
Preventative protocols reviewed and updated unless pt declined. Discussed healthy diet and lifestyle.  

## 2022-06-28 NOTE — Assessment & Plan Note (Signed)

## 2022-06-28 NOTE — Assessment & Plan Note (Signed)
Chronic, stable on crestor MWF - continue. The 10-year ASCVD risk score (Arnett DK, et al., 2019) is: 12.5%   Values used to calculate the score:     Age: 71 years     Sex: Female     Is Non-Hispanic African American: No     Diabetic: No     Tobacco smoker: No     Systolic Blood Pressure: 957 mmHg     Is BP treated: Yes     HDL Cholesterol: 47 mg/dL     Total Cholesterol: 168 mg/dL

## 2022-06-28 NOTE — Assessment & Plan Note (Signed)
Encouraged healthy diet and lifestyle choices to affect sustainable weight loss.  ?

## 2022-06-28 NOTE — Progress Notes (Signed)
Patient ID: Yolanda Walsh, female    DOB: 11/17/1950, 71 y.o.   MRN: [id number]  This visit was conducted in person.  BP 122/64   Pulse 71   Temp 97.6 F (36.4 C) (Temporal)   Ht 5' 3.75" (1.619 m)   Wt 195 lb 9.6 oz (88.7 kg)   SpO2 98%   BMI 33.84 kg/m    CC: CPE/AMW Subjective:   HPI: Yolanda Walsh is a 71 y.o. female presenting on 06/28/2022 for Medicare Wellness   Did not see health advisor yet.   Hearing Screening   '500Hz'$  '1000Hz'$  '2000Hz'$  '4000Hz'$   Right ear 40 40 20 25  Left ear 0 0 0 0  Comments: Wears B hearing aids. Not wearing at today's OV. Heard practice tone on 40 dBHL in L ear. Pt is aware L ear is the worse.   Vision Screening - Comments:: Last eye exam, 02/2022.  Flowsheet Row Clinical Support from 06/23/2021 in [place] [facility] [place]  PHQ-2 Total Score 0          06/28/2022    8:41 AM 06/21/2022    2:22 PM 06/23/2021    9:05 AM 06/18/2020    9:15 AM 06/13/2019    3:35 PM  Fall Risk   Falls in the past year? 0 0 0 1 0  Comment    fell in shower   Number falls in past yr:   0 0 0  Injury with Fall?   0 0 0  Risk for fall due to :   No Fall Risks Medication side effect   Follow up   Falls prevention discussed Falls evaluation completed;Falls prevention discussed Falls evaluation completed;Falls prevention discussed   Meniere's disease seeing ENT. Trouble with full dose diazide (syncope), tolerates hctz 6.'25mg'$  daily. No recent vertigo. Wears hearing aides.   Preventative: Colonoscopy 07/2016 - TA, diverticulosis, rpt 5 yrs Henrene Pastor).  Colonoscopy 11/2021 - TAx1, diverticulosis, rpt 7 yrs Henrene Pastor) Well woman with Dr. Marvel Plan h/o partal hysterectomy. Sees yearly (last seen 04/2022). Ovaries remain.  Mammogram - 04/2022 Birads1 @ [facility] DEXA 04/2012, 03/2015, 04/2017, 05/2019 T -2.4 hip, -1.8 spine  DEXA @ [facility] - T -2.6 L fem neck, -2.3 spine (04/2022). Prolia started 05/18/2022, tolerated first shot well. Lung cancer screening - not  eligible  Flu - yearly Viola 07/2019, 08/2019, booster 03/2020, bivalent booster 03/2021, booster 04/2022  Prevnar-13 2017, pneumovax 2018 - localized reaction to this.  Tdap 2012, 09/2020.  RSV - holding off for now  Zostavax - 2013 Shingrix - 09/2017, 11/2017  Advanced directive - scanned in chart 06/2019. Daughter and son are HCPOA. Does not want prolonged life support. Ok with temporary measures.  Seat belt use discussed.  Sunscreen use discussed. No changing moles on skin. Has had difficulty getting in to see dermatologist.  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 Interim medical history since our last visit reviewed. Allergies and medications reviewed and updated. Outpatient Medications Prior to Visit  Medication  Sig Dispense Refill   Cholecalciferol (VITAMIN D3) 1000 UNITS CAPS Take 1 capsule (1,000 Units total) by mouth daily. 30 capsule    COVID-19 mRNA vaccine 2023-2024 (COMIRNATY) SUSP injection Inject into the muscle. 0.3 mL 0   fexofenadine (ALLEGRA) 180 MG tablet Take 180 mg by mouth daily.     hydrochlorothiazide (HYDRODIURIL) 12.5 MG tablet TAKE 1/2 TABLETS BY MOUTH DAILY AS NEEDED 45 tablet 0   ibuprofen (ADVIL,MOTRIN) 200 MG tablet Take 200 mg by mouth every 6 (six) hours as needed for mild pain or moderate pain.     meclizine (ANTIVERT) 25 MG tablet Take 1 tablet (25 mg total) by mouth 3 (three) times daily as needed for dizziness. 30 tablet 0   Multiple Vitamins-Minerals  (PRESERVISION AREDS 2+MULTI VIT PO) Take 2 capsules by mouth daily.     rosuvastatin (CRESTOR) 5 MG tablet Take 1 tablet (5 mg total) by mouth every Monday, Wednesday, and Friday. 40 tablet 3   sertraline (ZOLOFT) 50 MG tablet Take 1 tablet (50 mg total) by mouth daily. 90 tablet 3   No facility-administered medications prior to visit.     Per HPI unless specifically indicated in ROS section below Review of Systems  Constitutional:  Negative for activity change, appetite change, chills, fatigue, fever and unexpected weight change.  HENT:  Negative for hearing loss.   Eyes:  Negative for visual disturbance.  Respiratory:  Negative for cough, chest tightness, shortness of breath and wheezing.   Cardiovascular:  Negative for chest pain, palpitations and leg swelling.  Gastrointestinal:  Negative for abdominal distention, abdominal pain, blood in stool, constipation, diarrhea, nausea and vomiting.  Genitourinary:  Negative for difficulty urinating and hematuria.  Musculoskeletal:  Negative for arthralgias, myalgias and neck pain.  Skin:  Negative for rash.  Neurological:  Negative for dizziness, seizures, syncope and headaches.  Hematological:  Negative for adenopathy. Does not bruise/bleed easily.  Psychiatric/Behavioral:  Negative for dysphoric mood. The patient is not nervous/anxious.     Objective:  BP 122/64   Pulse 71   Temp 97.6 F (36.4 C) (Temporal)   Ht 5' 3.75" (1.619 m)   Wt 195 lb 9.6 oz (88.7 kg)   SpO2 98%   BMI 33.84 kg/m   Wt Readings from Last 3 Encounters:  06/28/22 195 lb 9.6 oz (88.7 kg)  05/11/22 196 lb 2 oz (89 kg)  01/19/22 196 lb 2 oz (89 kg)      Physical Exam Vitals and nursing note reviewed.  Constitutional:      Appearance: Normal appearance. She is not ill-appearing.  HENT:     Head: Normocephalic and atraumatic.      Comments: Small skin ulcer to left cheek present for 1 week     Right Ear: Tympanic membrane, ear canal and external ear normal.  There is no impacted cerumen.     Left Ear: Tympanic membrane, ear canal and external ear normal. There is no impacted cerumen.     Mouth/Throat:     Comments: Wearing mask Eyes:     General:        Right eye: No discharge.        Left eye: No discharge.     Extraocular Movements: Extraocular movements intact.     Conjunctiva/sclera: Conjunctivae normal.     Pupils: Pupils are equal, round, and reactive to light.  Neck:     Thyroid: No thyroid mass or thyromegaly.     Vascular: No carotid bruit.  Cardiovascular:  Rate and Rhythm: Normal rate and regular rhythm.     Pulses: Normal pulses.     Heart sounds: Normal heart sounds. No murmur heard. Pulmonary:     Effort: Pulmonary effort is normal. No respiratory distress.     Breath sounds: Normal breath sounds. No wheezing, rhonchi or rales.  Abdominal:     General: Bowel sounds are normal. There is no distension.     Palpations: Abdomen is soft. There is no mass.     Tenderness: There is no abdominal tenderness. There is no guarding or rebound.     Hernia: No hernia is present.  Musculoskeletal:     Cervical back: Normal range of motion and neck supple. No rigidity.     Right lower leg: No edema.     Left lower leg: No edema.  Lymphadenopathy:     Cervical: No cervical adenopathy.  Skin:    General: Skin is warm and dry.     Findings: No rash.  Neurological:     General: No focal deficit present.     Mental Status: She is alert. Mental status is at baseline.     Comments:  Recall 3/3 Calculation 5/5 DLROW  Psychiatric:        Mood and Affect: Mood normal.        Behavior: Behavior normal.       Results for orders placed or performed in visit on 06/21/22  Lipid panel  Result Value Ref Range   Cholesterol 168 0 - 200 mg/dL   Triglycerides 139.0 0.0 - 149.0 mg/dL   HDL 47.00 >39.00 mg/dL   VLDL 27.8 0.0 - 40.0 mg/dL   LDL Cholesterol 93 0 - 99 mg/dL   Total CHOL/HDL Ratio 4    NonHDL 120.50   Comprehensive  metabolic panel  Result Value Ref Range   Sodium 139 135 - 145 mEq/L   Potassium 3.9 3.5 - 5.1 mEq/L   Chloride 103 96 - 112 mEq/L   CO2 28 19 - 32 mEq/L   Glucose, Bld 97 70 - 99 mg/dL   BUN 16 6 - 23 mg/dL   Creatinine, Ser 0.74 0.40 - 1.20 mg/dL   Total Bilirubin 0.4 0.2 - 1.2 mg/dL   Alkaline Phosphatase 60 39 - 117 U/L   AST 14 0 - 37 U/L   ALT 9 0 - 35 U/L   Total Protein 6.8 6.0 - 8.3 g/dL   Albumin 4.0 3.5 - 5.2 g/dL   GFR 81.27 >60.00 mL/min   Calcium 9.0 8.4 - 10.5 mg/dL  TSH  Result Value Ref Range   TSH 4.36 0.35 - 5.50 uIU/mL  VITAMIN D 25 Hydroxy (Vit-D Deficiency, Fractures)  Result Value Ref Range   VITD 55.60 30.00 - 100.00 ng/mL  CBC with Differential/Platelet  Result Value Ref Range   WBC 7.4 4.0 - 10.5 K/uL   RBC 4.26 3.87 - 5.11 Mil/uL   Hemoglobin 11.3 (L) 12.0 - 15.0 g/dL   HCT 34.1 (L) 36.0 - 46.0 %   MCV 80.2 78.0 - 100.0 fl   MCHC 33.2 30.0 - 36.0 g/dL   RDW 15.6 (H) 11.5 - 15.5 %   Platelets 286.0 150.0 - 400.0 K/uL   Neutrophils Relative % 56.6 43.0 - 77.0 %   Lymphocytes Relative 34.0 12.0 - 46.0 %   Monocytes Relative 8.2 3.0 - 12.0 %   Eosinophils Relative 0.8 0.0 - 5.0 %   Basophils Relative 0.4 0.0 - 3.0 %   Neutro Abs 4.2  1.4 - 7.7 K/uL   Lymphs Abs 2.5 0.7 - 4.0 K/uL   Monocytes Absolute 0.6 0.1 - 1.0 K/uL   Eosinophils Absolute 0.1 0.0 - 0.7 K/uL   Basophils Absolute 0.0 0.0 - 0.1 K/uL    Assessment & Plan:   Problem List Items Addressed This Visit     Healthcare maintenance (Chronic)    Preventative protocols reviewed and updated unless pt declined. Discussed healthy diet and lifestyle.       Medicare annual wellness visit, subsequent - Primary (Chronic)    I have personally reviewed the Medicare Annual Wellness questionnaire and have noted 1. The patient's medical and social history 2. Their use of alcohol, tobacco or illicit drugs 3. Their current medications and supplements 4. The patient's functional ability including  ADL's, fall risks, home safety risks and hearing or visual impairment. Cognitive function has been assessed and addressed as indicated.  5. Diet and physical activity 6. Evidence for depression or mood disorders The patients weight, height, BMI have been recorded in the chart. I have made referrals, counseling and provided education to the patient based on review of the above and I have provided the pt with a written personalized care plan for preventive services. Provider list updated.. See scanned questionairre as needed for further documentation. Reviewed preventative protocols and updated unless pt declined.       Osteoporosis    Received first Prolia 05/2022, tolerated well. Will be due 11/2022.       GAD (generalized anxiety disorder)    Stable period on daily sertraline '50mg'$  - continue this.       Hyperlipidemia    Chronic, stable on crestor MWF - continue. The 10-year ASCVD risk score (Arnett DK, et al., 2019) is: 12.5%   Values used to calculate the score:     Age: 26 years     Sex: Female     Is Non-Hispanic African American: No     Diabetic: No     Tobacco smoker: No     Systolic Blood Pressure: 263 mmHg     Is BP treated: Yes     HDL Cholesterol: 47 mg/dL     Total Cholesterol: 168 mg/dL       Obesity, Class I, BMI 30-34.9    Encouraged healthy diet and lifestyle choices to affect sustainable weight loss.       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       Vitamin D deficiency    Continue vit D 1000 IU daily.       Meniere disease, left   Hearing loss of left ear   Skin lesion of cheek    Newly noted 1 wk ago, she's been picking at it. Advised daily vaseline or abx ointment and let me know if not improving as expected for derm eval to r/o skin cancer.       Anemia    Mild, present over the past year. Check anemia panel next labwork.  UTD colonoscopy.         No orders of the defined types were placed in this  encounter.  No orders of the defined types were placed in this encounter.   Patient instructions: You are doing well today. Return in 1 year for physical, sooner if needed Let me know if skin lesion on cheek not improving.   Follow up plan: Return in about 1 year (around 06/29/2023) for annual exam, prior fasting for blood work, Information systems manager  wellness visit.  Ria Bush, MD

## 2022-06-29 ENCOUNTER — Ambulatory Visit: Payer: PPO | Admitting: Pharmacist

## 2022-06-29 DIAGNOSIS — M810 Age-related osteoporosis without current pathological fracture: Secondary | ICD-10-CM

## 2022-06-29 DIAGNOSIS — H8102 Meniere's disease, left ear: Secondary | ICD-10-CM

## 2022-06-29 DIAGNOSIS — F411 Generalized anxiety disorder: Secondary | ICD-10-CM

## 2022-06-29 DIAGNOSIS — E785 Hyperlipidemia, unspecified: Secondary | ICD-10-CM

## 2022-06-29 NOTE — Progress Notes (Signed)
Chronic Care Management Pharmacy Note  06/29/2022 Name:  Yolanda Walsh MRN:  [id number] DOB:  [birth year]  Summary: F/U visit -HLD: LDL 93 (06/2022) at goal on 3x weekly rosuvastatin -Ostoeporosis: pt started Prolia 05/2022 and denies issues with first injection  Recommendations/Changes made from today's visit: -No med changes  Plan: -[facility] will call patient 6 months for general update -Pharmacist follow up PRN -PCP appt 07/01/23 (CPE)     Subjective: Yolanda Walsh is an 71 y.o. year old female who is a primary patient of Ria Bush, MD.  The CCM team was consulted for assistance with disease management and care coordination needs.    Engaged with patient by telephone for follow up visit in response to provider referral for pharmacy case management and/or care coordination services.   Consent to Services:  The patient was given information about Chronic Care Management services, agreed to services, and gave verbal consent prior to initiation of services.  Please see initial visit note for detailed documentation.   Patient Care Team: Ria Bush, MD as PCP - General (Family Medicine) Eulogio Bear, MD as Consulting Physician (Ophthalmology) Jodi Marble, MD as Consulting Physician (Otolaryngology) Charlton Haws, [facility] as Pharmacist (Pharmacist)  Lives with cat  Widow, husband deceased from [place].  Daughter now local, son local [place]).  Occupation: retired, prior worked in Psychologist, educational  Activity: no regular exercise  Diet: good water, fruits/vegetables daily, good calcium in diet  Recent office visits: 06/28/22 Dr Danise Mina OV: annual - no changes. RTC 1 year.  05/11/22 Dr Danise Mina OV: osteoporosis - start Prolia.   Recent consult visits: 03/24/2021 - Leandrew Koyan[facility] - Ophthalmology - Patient presented for Nonexudative age-related macular degeneration, bilateral, intermediate dry stage. Procedure: Ophthalmic  imaging retina and eye exam.  03/23/2021 - [facility] - Audiology - Patient presented for sensorineural hearing loss, bilateral (Primary Dx); Left-sided tinnitus. Patient was fit with two Signia TH Advanced RIC hearing aids and presented for related follow-up care.  01/16/2021 - [facility] - Audiology - Patient presented for sensorineural hearing loss, bilateral (Primary Dx); Left-sided tinnitus. Impression: Mnire's disease left ear. Follow up 1 year.   Hospital visits: Medication Reconciliation was completed by comparing discharge summary, patient's EMR and Pharmacy list, and upon discussion with patient.   Admitted to the ED on 01/09/2021 due to left-sided chest pain. Discharge date was 01/09/2021. Discharged from [facility].    -Chest x-ray is unremarkable, blood work is reassuring, 2 negative troponins without a delta, D-dimer is negative.  Treating physician was very reassured with a low suspicion for ACS/PE/dissection.     Medications that remain the same after Hospital Discharge:??  -All other medications will remain the same.     Objective:  Lab Results  Component Value Date   CREATININE 0.74 06/21/2022   BUN 16 06/21/2022   GFR 81.27 06/21/2022   GFRNONAA >60 01/09/2021   GFRAA >60 09/18/2019   NA 139 06/21/2022   K 3.9 06/21/2022   CALCIUM 9.0 06/21/2022   CO2 28 06/21/2022   GLUCOSE 97 06/21/2022    Lab Results  Component Value Date/Time   GFR 81.27 06/21/2022 07:45 AM   GFR 64.31 05/11/2022 01:00 PM    Last diabetic Eye exam: No results found for: "HMDIABEYEEXA"  Last diabetic Foot exam: No results found for: "HMDIABFOOTEX"   Lab Results  Component Value Date   CHOL 168 06/21/2022   HDL 47.00 06/21/2022   LDLCALC 93 06/21/2022   LDLDIRECT 170.0  03/23/2012   TRIG 139.0 06/21/2022   CHOLHDL 4 06/21/2022       Latest Ref Rng & Units 06/21/2022    7:45 AM 06/18/2021    2:33 PM 01/09/2021    9:42 AM  Hepatic Function  Total Protein 6.0 - 8.3  g/dL 6.8  6.9  6.8   Albumin 3.5 - 5.2 g/dL 4.0  4.1  3.7   AST 0 - 37 U/L _0 ALT 0 - 35 U/L _1 Alk Phosphatase 39 - 117 U/L 60  72  66   Total Bilirubin 0.2 - 1.2 mg/dL 0.4  0.6  0.5   Bilirubin, Direct 0.0 - 0.2 mg/dL   <0.1     Lab Results  Component Value Date/Time   TSH 4.36 06/21/2022 07:45 AM   TSH 3.11 06/19/2020 08:50 AM       Latest Ref Rng & Units 06/21/2022    7:45 AM 06/18/2021    2:33 PM 01/09/2021    9:42 AM  CBC  WBC 4.0 - 10.5 K/uL 7.4  7.0  7.5   Hemoglobin 12.0 - 15.0 g/dL 11.3  11.7  11.8   Hematocrit 36.0 - 46.0 % 34.1  36.3  36.4   Platelets 150.0 - 400.0 K/uL 286.0  249.0  281     Lab Results  Component Value Date/Time   VD25OH 55.60 06/21/2022 07:45 AM   VD25OH 40.67 06/18/2021 02:33 PM    Clinical ASCVD: No  The 10-year ASCVD risk score (Arnett DK, et al., 2019) is: 12.5%   Values used to calculate the score:     Age: 15 years     Sex: Female     Is Non-Hispanic African American: No     Diabetic: No     Tobacco smoker: No     Systolic Blood Pressure: 756 mmHg     Is BP treated: Yes     HDL Cholesterol: 47 mg/dL     Total Cholesterol: 168 mg/dL       06/28/2022    9:30 AM 06/23/2021    9:07 AM 06/18/2020    9:16 AM  Depression screen PHQ 2/9  Decreased Interest 0 0 0  Down, Depressed, Hopeless 0 0 0  PHQ - 2 Score 0 0 0  Altered sleeping 0  0  Tired, decreased energy 0  0  Change in appetite 0  0  Feeling bad or failure about yourself  0  0  Trouble concentrating 0  0  Moving slowly or fidgety/restless 0  0  Suicidal thoughts 0  0  PHQ-9 Score 0  0  Difficult doing work/chores   Not difficult at all     Social History   Tobacco Use  Smoking Status Never  Smokeless Tobacco Never   BP Readings from Last 3 Encounters:  06/28/22 122/64  05/11/22 120/64  01/19/22 124/72   Pulse Readings from Last 3 Encounters:  06/28/22 71  05/11/22 69  01/19/22 67   Wt Readings from Last 3 Encounters:  06/28/22 195  lb 9.6 oz (88.7 kg)  05/11/22 196 lb 2 oz (89 kg)  01/19/22 196 lb 2 oz (89 kg)   BMI Readings from Last 3 Encounters:  06/28/22 33.84 kg/m  05/11/22 33.66 kg/m  01/19/22 33.66 kg/m    Assessment/Interventions: Review of patient past medical history, allergies, medications, health status, including review of consultants reports, laboratory and other test data, was performed as part  of comprehensive evaluation and provision of chronic care management services.   SDOH:  (Social Determinants of Health) assessments and interventions performed: Yes SDOH Interventions    Flowsheet Row Chronic Care Management from 06/29/2022 in [place] at [place] from 06/18/2020 in [place] at [place] from 06/13/2019 in [place] at [place] from 06/01/2017 in [place] at [place] Interventions Intervention Not Indicated -- -- --  Depression Interventions/Treatment  -- WPY0-9 Score <4 Follow-up Not Indicated PHQ2-9 Score <4 Follow-up Not Indicated --  [referral to PCP]  Financial Strain Interventions Intervention Not Indicated -- -- --      [place]: No Food Insecurity (06/29/2022)  Housing: Low Risk  (06/23/2021)  Transportation Needs: No Transportation Needs (06/23/2021)  Utilities: Not At Risk (06/21/2022)  Alcohol Screen: Low Risk  (06/23/2021)  Depression (PHQ2-9): Low Risk  (06/28/2022)  Financial Resource Strain: Low Risk  (06/29/2022)  Physical Activity: Insufficiently Active (06/23/2021)  Social Connections: Socially Isolated (06/23/2021)  Stress: No Stress Concern Present (06/23/2021)  Tobacco Use: Low Risk  (06/28/2022)    [place]  No Known Allergies  Medications Reviewed Today     Reviewed by Charlton Haws, [facility] (Pharmacist) on 06/29/22 at 0949  Med List Status: <None>   Medication Order Taking?  Sig Documenting Provider Last Dose Status Informant  Cholecalciferol (VITAMIN D3) 1000 UNITS CAPS [id number] Yes Take 1 capsule (1,000 Units total) by mouth daily. Ria Bush, MD Taking Active Self  fexofenadine [facility]) 180 MG tablet [id number] Yes Take 180 mg by mouth daily. [provider] Taking Active   hydrochlorothiazide (HYDRODIURIL) 12.5 MG tablet [id number] Yes TAKE 1/2 TABLETS BY MOUTH DAILY AS NEEDED Ria Bush, MD Taking Active   ibuprofen (ADVIL,MOTRIN) 200 MG tablet [id number] Yes Take 200 mg by mouth every 6 (six) hours as needed for mild pain or moderate pain. [provider] Taking Active Self  meclizine (ANTIVERT) 25 MG tablet [id number] Yes Take 1 tablet (25 mg total) by mouth 3 (three) times daily as needed for dizziness. Maudie Flakes, MD Taking Active   Multiple Vitamins-Minerals (PRESERVISION AREDS 2+MULTI VIT PO) [id number] Yes Take 2 capsules by mouth daily. [provider] Taking Active Self  rosuvastatin (CRESTOR) 5 MG tablet [id number] Yes Take 1 tablet (5 mg total) by mouth every Monday, Wednesday, and Friday. Ria Bush, MD Taking Active   sertraline (ZOLOFT) 50 MG tablet [id number] Yes Take 1 tablet (50 mg total) by mouth daily. Ria Bush, MD Taking Active             Patient Active Problem List   Diagnosis Date Noted   Medicare annual wellness visit, subsequent 06/28/2022   Skin lesion of cheek 06/28/2022   Anemia 06/28/2022   Fatigue 01/19/2022   Constipation 12/11/2021   Abdominal bloating 12/11/2021   Heart burn 12/11/2021   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   Meniere disease, left 09/18/2019   Bilateral cataracts 06/18/2019   Syncope 02/27/2019   Vitamin D deficiency 06/12/2018   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 degeneration), bilateral 06/07/2017   Advanced care planning/counseling discussion 05/27/2016   Obesity, Class I, BMI 30-34.9 05/27/2016   Tinnitus 01/29/2013   Healthcare maintenance  04/24/2012   Hyperlipidemia 04/24/2012   Osteoporosis 01/17/2008   GAD (generalized anxiety disorder) 01/17/2008    Immunization History  Administered Date(s) Administered   COVID-19, mRNA, vaccine(Comirnaty)12 years and older  04/22/2022   Fluad Quad(high Dose 65+) 03/30/2022   Influenza Split 04/18/2012   Influenza Whole 04/13/2008   Influenza, High Dose Seasonal PF 03/26/2020, 03/26/2021   Influenza,inj,Quad PF,6+ Mos 04/19/2013, 04/01/2014, 03/28/2015, 03/31/2016, 04/08/2017, 04/06/2018, 03/01/2019   Influenza-Unspecified 03/26/2021   PFIZER(Purple Top)SARS-COV-2 Vaccination 08/04/2019, 08/25/2019, 04/08/2020   Pfizer Covid-19 Vaccine Bivalent Booster 83yr & up 03/31/2021   Pneumococcal Conjugate-13 05/27/2016   Pneumococcal Polysaccharide-23 06/01/2017   Td 03/13/1999   Tdap 02/04/2011, 09/30/2020   Zoster Recombinat (Shingrix) 09/12/2017, 11/14/2017   Zoster, Live 05/23/2012    Conditions to be addressed/monitored:  Hyperlipidemia, Anxiety, Osteopenia, and Meniere's disease  Care Plan : [place] Updates made by FCharlton Haws REvanssince 06/29/2022 12:00 AM     Problem: Hyperlipidemia, Anxiety, Osteopenia, and Meniere's disease      Long-Range Goal: Disease mgmt   Start Date: 06/17/2021  Expected End Date: 06/29/2022  This Visit's Progress: On track  Recent Progress: On track  Priority: High  Note:   Current Barriers:  None identified  Pharmacist Clinical Goal(s):  Patient will contact provider office for questions/concerns as evidenced notation of same in electronic health record through collaboration with PharmD and provider.   Interventions: 1:1 collaboration with GRia Bush MD regarding development and update of comprehensive plan of care as evidenced by provider attestation and co-signature Inter-disciplinary care team collaboration (see longitudinal plan of care) Comprehensive medication review performed; medication list  updated in electronic medical record  Hyperlipidemia: (LDL goal < 130) -Controlled - LDL 93 (06/2022), improved from baseline 182; she had myalgias with daily rosuvastatin, does well with 3x a week dosing -Current treatment: Rosuvastatin 5 mg MWF - Appropriate, Effective, Safe, Accessible -Educated on Cholesterol goals; Benefits of statin for ASCVD risk reduction; -Recommended to continue current medication  Anxiety (Goal: managesymptoms) -Controlled - pt reports she "had a good year"; she usually takes just 50 mg of sertraline (1 tablet) and will take extra half if she is stressed -GAD7: not on file -Current treatment: Sertraline 50 mg - 1.5 tab daily - Appropriate, Effective, Safe, Accessible -Medications previously tried/failed: diazepam -Educated on Benefits of medication for symptom control; long-term action of sertraline - there is unlikely to be benefit from taking higher dose inconsistently; pt is fine with continuing 50 mg daily -Recommended to continue current medication  Osteoporosis (Goal prevent fractures) -Query controlled - pt started Prolia Nov 2023 - pt reported limited exercise, she is taking vitamin D without calcium -Last DEXA Scan: 05/14/2019  > 05/06/22  T-Score femoral neck: -2.4 > -2.6  T-Score total hip: -1.9 > -1.7  T-Score lumbar spine: -1.8 > -2.3  10-year probability of major osteoporotic fracture: 20%  10-year probability of hip fracture: 4.2% -Patient is a candidate for pharmacologic treatment due to T-Score -1.0 to -2.5 and 10-year risk of hip fracture > 3% -Current treatment  Vitamin D 2000 IU daily - Appropriate, Effective, Safe, Accessible Prolia 69 mg q6 months (first dose 05/18/22) - Appropriate, Effective, Safe, Accessible -Recommend weight-bearing and muscle strengthening exercises for building and maintaining bone density. -Recommend to continue current medication; repeat DEXA 2025  Meniere's disease (Goal: manage symptoms) -Controlled - pt  reports minimal symptoms/flares currently; she is not checking BP at home but denies dizziness, lightheadedness -Current treatment  HCTZ 12.5 mg - 1/2 tab daily - Appropriate, Effective, Safe, Accessible -Recommended to continue current medication  Health Maintenance -Vaccine gaps: none -Current therapy:  Fexofenadine 180 mg daily Ibuprofen 200 mg PRN - rare use Meclizine  25 mg PRN - twice a year Preservision Areds 2 BID -Patient is satisfied with current therapy and denies issues -Recommended to continue current medication  Patient Goals/Self-Care Activities Patient will:  - take medications as prescribed as evidenced by patient report and record review focus on medication adherence by pill box check blood pressure periodically, document, and provide at future appointments target a minimum of 150 minutes of moderate intensity exercise weekly       Medication Assistance: None required.  Patient affirms current coverage meets needs.  Compliance/Adherence/Medication fill history: Care Gaps: None  Star-Rating Drugs: Rosuvastatin - PDC 62%; LF 01/19/22 x 90 ds. Per pt refilled 04/20/22.  Medication Access: Within the past 30 days, how often has patient missed a dose of medication? 0 Is a pillbox or other method used to improve adherence? Yes  Factors that may affect medication adherence? no barriers identified Are meds synced by current pharmacy? No  Are meds delivered by current pharmacy? No  Does patient experience delays in picking up medications due to transportation concerns? No   Upstream Services Reviewed: Is patient disadvantaged to use [facility] Pharmacy?: No  Current Rx insurance plan: HTA Name and location of Current pharmacy:  [facility], [place] [address] [place] [address] Phone: [phone] Fax: [phone]  UpStream Pharmacy services reviewed with patient today?: No  Patient requests to transfer care to  Upstream Pharmacy?: No  Reason patient declined to change pharmacies: Loyalty to other pharmacy/Patient preference   Care Plan and Follow Up Patient Decision:  Patient agrees to Care Plan and Follow-up.  Plan: The patient has been provided with contact information for the care management team and has been advised to call with any health related questions or concerns.   Charlene Brooke, PharmD, BCACP Clinical Pharmacist [facility] at [facility] [phone]

## 2022-06-29 NOTE — Patient Instructions (Signed)
Visit Information  Phone number for Pharmacist: [phone]   Goals Addressed   None     Care Plan : [place]  Updates made by Charlton Haws, [place] since 06/29/2022 12:00 AM     Problem: Hyperlipidemia, Anxiety, Osteopenia, and Meniere's disease      Long-Range Goal: Disease mgmt   Start Date: 06/17/2021  Expected End Date: 06/29/2022  This Visit's Progress: On track  Recent Progress: On track  Priority: High  Note:   Current Barriers:  None identified  Pharmacist Clinical Goal(s):  Patient will contact provider office for questions/concerns as evidenced notation of same in electronic health record through collaboration with PharmD and provider.   Interventions: 1:1 collaboration with Ria Bush, MD regarding development and update of comprehensive plan of care as evidenced by provider attestation and co-signature Inter-disciplinary care team collaboration (see longitudinal plan of care) Comprehensive medication review performed; medication list updated in electronic medical record  Hyperlipidemia: (LDL goal < 130) -Controlled - LDL 93 (06/2022), improved from baseline 182; she had myalgias with daily rosuvastatin, does well with 3x a week dosing -Current treatment: Rosuvastatin 5 mg MWF - Appropriate, Effective, Safe, Accessible -Educated on Cholesterol goals; Benefits of statin for ASCVD risk reduction; -Recommended to continue current medication  Anxiety (Goal: managesymptoms) -Controlled - pt reports she "had a good year"; she usually takes just 50 mg of sertraline (1 tablet) and will take extra half if she is stressed -GAD7: not on file -Current treatment: Sertraline 50 mg - 1.5 tab daily - Appropriate, Effective, Safe, Accessible -Medications previously tried/failed: diazepam -Educated on Benefits of medication for symptom control; long-term action of sertraline - there is unlikely to be benefit from taking higher dose inconsistently; pt  is fine with continuing 50 mg daily -Recommended to continue current medication  Osteoporosis (Goal prevent fractures) -Query controlled - pt started Prolia Nov 2023 - pt reported limited exercise, she is taking vitamin D without calcium -Last DEXA Scan: 05/14/2019  > 05/06/22  T-Score femoral neck: -2.4 > -2.6  T-Score total hip: -1.9 > -1.7  T-Score lumbar spine: -1.8 > -2.3  10-year probability of major osteoporotic fracture: 20%  10-year probability of hip fracture: 4.2% -Patient is a candidate for pharmacologic treatment due to T-Score -1.0 to -2.5 and 10-year risk of hip fracture > 3% -Current treatment  Vitamin D 2000 IU daily - Appropriate, Effective, Safe, Accessible Prolia 69 mg q6 months (first dose 05/18/22) - Appropriate, Effective, Safe, Accessible -Recommend weight-bearing and muscle strengthening exercises for building and maintaining bone density. -Recommend to continue current medication; repeat DEXA 2025  Meniere's disease (Goal: manage symptoms) -Controlled - pt reports minimal symptoms/flares currently; she is not checking BP at home but denies dizziness, lightheadedness -Current treatment  HCTZ 12.5 mg - 1/2 tab daily - Appropriate, Effective, Safe, Accessible -Recommended to continue current medication  Health Maintenance -Vaccine gaps: none -Current therapy:  Fexofenadine 180 mg daily Ibuprofen 200 mg PRN - rare use Meclizine 25 mg PRN - twice a year Preservision Areds 2 BID -Patient is satisfied with current therapy and denies issues -Recommended to continue current medication  Patient Goals/Self-Care Activities Patient will:  - take medications as prescribed as evidenced by patient report and record review focus on medication adherence by pill box check blood pressure periodically, document, and provide at future appointments target a minimum of 150 minutes of moderate intensity exercise weekly      Patient verbalizes understanding of instructions  and care plan provided today and  agrees to view in [place]. Active MyChart status and patient understanding of how to access instructions and care plan via MyChart confirmed with patient.    Telephone follow up appointment with pharmacy team member scheduled for: PRN  Charlene Brooke, PharmD, BCACP Clinical Pharmacist [facility] at [facility] [phone]

## 2022-07-19 ENCOUNTER — Other Ambulatory Visit: Payer: Self-pay | Admitting: Family Medicine

## 2022-07-19 DIAGNOSIS — F411 Generalized anxiety disorder: Secondary | ICD-10-CM

## 2022-07-19 DIAGNOSIS — E785 Hyperlipidemia, unspecified: Secondary | ICD-10-CM

## 2022-09-10 ENCOUNTER — Telehealth: Payer: Self-pay

## 2022-09-10 ENCOUNTER — Ambulatory Visit
Admission: EM | Admit: 2022-09-10 | Discharge: 2022-09-10 | Disposition: A | Discharge status: Home / Self Care | Payer: PPO | Attending: Urgent Care | Admitting: Urgent Care

## 2022-09-10 DIAGNOSIS — U071 COVID-19: Secondary | ICD-10-CM | POA: Diagnosis not present

## 2022-09-10 DIAGNOSIS — R6889 Other general symptoms and signs: Secondary | ICD-10-CM | POA: Diagnosis not present

## 2022-09-10 NOTE — Telephone Encounter (Signed)
[facility] Night - Client Nonclinical Telephone Record  [facility] Client [name] - Client Client Site [place] Provider Ria Bush - MD Contact Type Call Who Is Calling Patient / Member / Family / Caregiver Caller Name [place] Phone Number [phone] Patient Name Yolanda Walsh Patient DOB Dec 10, 1950 Call Type Message Only Information Provided Reason for Call Request to Schedule Office Appointment Initial Comment Caller says she has sore throat and chest pain. Patient request to speak to RN No Disp. Time Disposition Final User 09/10/2022 8:06:49 AM General Information Provided Yes Bettye Boeck Call Closed By: Bettye Boeck Transaction Date/Time: 09/10/2022 8:03:52 AM (ET   I was going to call pt and per chart review pt is at [facility] at this time. Sending note to Dr Darnell Level and G pool.

## 2022-09-10 NOTE — Discharge Instructions (Addendum)
You have been diagnosed with a viral upper respiratory infection based on your symptoms and exam. Viral illnesses cannot be treated with antibiotics - they are self limiting - and you should find your symptoms resolving within a few days. Get plenty of rest and non-caffeinated fluids. Watch for signs of dehydration including reduced urine output and dark colored urine.     We have performed a respiratory swab testing for COVID. If the results of this testing are positive, someone will call you if you are eligible for any antiviral treatment.    We recommend you use over-the-counter medications for symptom control including acetaminophen (Tylenol), ibuprofen (Advil/Motrin) or naproxen (Aleve) for throat pain, fever, chills or body aches. You may combine use of acetaminophen and ibuprofen/naproxen if needed.  Some patients find an pain-relieving throat spray such as Chloraseptic to be effective.  Also recommend cold/cough medication containing a cough suppressant such as dextromethorphan, as needed.7  Saline mist spray is helpful for removing excess mucus from your nose.  Room humidifiers are helpful to ease breathing at night. I recommend guaifenesin (Mucinex) with plenty of water throughout the day to help thin and loosen mucus secretions in your respiratory passages.   If appropriate based upon your other medical problems, you might also find relief of nasal/sinus congestion symptoms by using a nasal decongestant such as fluticasone (Flonase ) or pseudoephedrine (Sudafed sinus).  You will need to obtain Sudafed from behind the pharmacist counter.  Speak to the pharmacist to verify that you are not duplicating medications with other over-the-counter formulations that you may be using.

## 2022-09-10 NOTE — ED Triage Notes (Signed)
Fatigue, cough, chills, body aches, sore throat, ear pain, congestion. That started Tuesday. Taking zyrtec with no relief of symptoms.

## 2022-09-10 NOTE — ED Provider Notes (Signed)
Roderic Palau    CSN: [id number] Arrival date & time: 09/10/22  0831      History   Chief Complaint Chief Complaint  Patient presents with   Cough   Sore Throat    HPI Yolanda Walsh is a 72 y.o. female.    Cough Sore Throat    This urgent care with symptoms starting 3 days ago.  She endorses fatigue, cough, chills, body aches, sore throat, ear pain, congestion.  She is using antihistamine Zyrtec without any relief of symptoms.  Started Tuesday night overnight with severe chills.  She endorses only "low-grade fever".  Used ibuprofen only once because she tries to avoid at the recommendation of her PCP.  Past Medical History:  Diagnosis Date   Allergy    Ankle fracture    right   Anxiety    Cataract    Depression    History of anal fissures 2013   worsened by constipation    History of hemorrhoids    w/o complications   History of rectal fissure    Hyperlipidemia [id number]   Lichen sclerosus    temovate cream   Medial meniscus tear    Osteopenia    dexa 2013, 2016   Trigger finger    Vertigo     Patient Active Problem List   Diagnosis Date Noted   Medicare annual wellness visit, subsequent 06/28/2022   Skin lesion of cheek 06/28/2022   Anemia 06/28/2022   Fatigue 01/19/2022   Constipation 12/11/2021   Abdominal bloating 12/11/2021   Heart burn 12/11/2021   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   Meniere disease, left 09/18/2019   Bilateral cataracts 06/18/2019   Syncope 02/27/2019   Vitamin D deficiency 06/12/2018   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 degeneration), bilateral 06/07/2017   Advanced care planning/counseling discussion 05/27/2016   Obesity, Class I, BMI 30-34.9 05/27/2016   Tinnitus 01/29/2013   Healthcare maintenance 04/24/2012   Hyperlipidemia 04/24/2012   Osteoporosis 01/17/2008   GAD (generalized anxiety disorder) 01/17/2008    Past Surgical History:  Procedure Laterality Date   CATARACT EXTRACTION W/PHACO Left 07/04/2019    Procedure: CATARACT EXTRACTION PHACO AND INTRAOCULAR LENS PLACEMENT (IOC) LEFT  6.67  00:44.0  15.2%;  Surgeon: Leandrew Koyanagi, MD;  Location: [place];  Service: Ophthalmology;  Laterality: Left;   CATARACT EXTRACTION W/PHACO Right 08/01/2019   Procedure: CATARACT EXTRACTION PHACO AND INTRAOCULAR LENS PLACEMENT (IOC) RIGHT 5.33  00:45.4  11.8%;  Surgeon: Leandrew Koyanagi, MD;  Location: [place];  Service: Ophthalmology;  Laterality: Right;   COLONOSCOPY  09/2003   COLONOSCOPY  07/2016   TA, diverticulosis, rpt 5 yrs Henrene Pastor)   COLONOSCOPY  11/2021   TAx1, diverticulosis, rpt 7 yrs Henrene Pastor)   dexa  04/2012   T -2.1 hip, -1.2 spine   dexa  03/2015   T -2.4 hip, -1.7 spine   I & D EXTREMITY Right 03/16/2014   cat bite hand s/p I&D in OR ([name])   left hand surgery  1962   tendon laceration   PARTIAL HYSTERECTOMY  1980   for prolapse, ovaries remain    OB History   No obstetric history on file.      Home Medications    Prior to Admission medications   Medication Sig Start Date End Date Taking? Authorizing Provider  Cholecalciferol (VITAMIN D3) 1000 UNITS CAPS Take 1 capsule (1,000 Units total) by mouth daily. 04/12/15  Yes Ria Bush, MD  hydrochlorothiazide (HYDRODIURIL) 12.5 MG tablet TAKE 1/2  TABLETS BY MOUTH DAILY AS NEEDED 06/25/22  Yes Ria Bush, MD  ibuprofen (ADVIL,MOTRIN) 200 MG tablet Take 200 mg by mouth every 6 (six) hours as needed for mild pain or moderate pain.   Yes [provider]  Multiple Vitamins-Minerals (PRESERVISION AREDS 2+MULTI VIT PO) Take 2 capsules by mouth daily.   Yes [provider]  rosuvastatin (CRESTOR) 5 MG tablet TAKE 1 TABLET BY MOUTH EVERY MONDAY,WEDNESDAY, AND FRIDAY 07/19/22  Yes Ria Bush, MD  sertraline (ZOLOFT) 50 MG tablet TAKE 1 TABLET BY MOUTH ONCE A DAY 07/19/22  Yes Ria Bush, MD  fexofenadine (ALLEGRA) 180 MG tablet Take 180 mg by mouth daily.    [provider]  meclizine (ANTIVERT) 25 MG tablet Take 1 tablet (25 mg total) by mouth 3 (three) times daily as needed for dizziness. 09/18/19   Maudie Flakes, MD    Family History Family History  Problem Relation Age of Onset   Cancer Mother        leukemia   Coronary artery disease Mother 50   Coronary artery disease Father 71       MI   Hypertension Sister    Crohn's disease Sister    Parkinsonism Brother 59       deceased   Stroke Brother    CAD Brother 45       7 stents   Hypertension Brother    Diabetes Maternal Grandfather    CAD Other 65       stent   Colon cancer Neg Hx    Esophageal cancer Neg Hx    Rectal cancer Neg Hx    Stomach cancer Neg Hx     Social History Social History   Tobacco Use   Smoking status: Never   Smokeless tobacco: Never  Vaping Use   Vaping Use: Never used  Substance Use Topics   Alcohol use: Not Currently   Drug use: No     Allergies   Patient has no known allergies.   Review of Systems Review of Systems  Respiratory:  Positive for cough.      Physical Exam Triage Vital Signs ED Triage Vitals  Enc Vitals Group     BP 09/10/22 0856 92/63     Pulse Rate 09/10/22 0856 93     Resp 09/10/22 0856 16     Temp 09/10/22 0856 99.4 F (37.4 C)     Temp Source 09/10/22 0856 Oral     SpO2 09/10/22 0856 94 %     Weight --      Height --      Head Circumference --      Peak Flow --      Pain Score 09/10/22 0858 10     Pain Loc --      Pain Edu? --      Excl. in [place]? --    No data found.  Updated Vital Signs BP 92/63 (BP Location: Left Arm)   Pulse 93   Temp 99.4 F (37.4 C) (Oral)   Resp 16   SpO2 94%   Visual Acuity Right Eye Distance:   Left Eye Distance:   Bilateral Distance:    Right Eye Near:   Left Eye Near:    Bilateral Near:     Physical Exam Vitals reviewed.  Constitutional:      Appearance: She is well-developed. She is ill-appearing.  HENT:     Nose: Congestion present.     Mouth/Throat:  Mouth: Mucous membranes are moist.     Pharynx: Posterior oropharyngeal erythema present. No oropharyngeal exudate.  Cardiovascular:     Rate and Rhythm: Normal rate and regular rhythm.     Heart sounds: Normal heart sounds.  Pulmonary:     Effort: Pulmonary effort is normal.     Breath sounds: Normal breath sounds.  Skin:    General: Skin is warm and dry.  Neurological:     General: No focal deficit present.     Mental Status: She is alert and oriented to person, place, and time.  Psychiatric:        Mood and Affect: Mood normal.        Behavior: Behavior normal.      UC Treatments / Results  Labs (all labs ordered are listed, but only abnormal results are displayed) Labs Reviewed - No data to display  EKG   Radiology No results found.  Procedures Procedures (including critical care time)  Medications Ordered in UC Medications - No data to display  Initial Impression / Assessment and Plan / UC Course  I have reviewed the triage vital signs and the nursing notes.  Pertinent labs & imaging results that were available during my care of the patient were reviewed by me and considered in my medical decision making (see chart for details).   Patient is afebrile here without recent antipyretics. Satting well on room air. Overall is ill appearing, well hydrated, without respiratory distress. Pulmonary exam is unremarkable.  Lungs CTAB without wheezing, rhonchi, rales.  Old oropharyngeal erythema.  No peritonsillar exudates.  Patient's symptoms are consistent with an acute viral process including influenza and COVID.  She is outside the treatment window for influenza antiviral therapy and none was offered today.  Will swab for COVID and offer antiviral therapy if she is positive.  Otherwise recommending use of OTC medications for symptom control.  She denies hypertension.  Final Clinical Impressions(s) / UC Diagnoses   Final diagnoses:  None   Discharge Instructions    None    ED Prescriptions   None    PDMP not reviewed this encounter.   Rose Phi, [place] 09/10/22 [phone]

## 2022-09-11 LAB — SARS CORONAVIRUS 2 (TAT 6-24 HRS): SARS Coronavirus 2: POSITIVE — AB

## 2022-09-29 ENCOUNTER — Other Ambulatory Visit: Payer: Self-pay | Admitting: Family Medicine

## 2022-09-29 DIAGNOSIS — H8102 Meniere's disease, left ear: Secondary | ICD-10-CM

## 2022-10-01 ENCOUNTER — Other Ambulatory Visit: Payer: Self-pay | Admitting: Family Medicine

## 2022-10-01 DIAGNOSIS — H8102 Meniere's disease, left ear: Secondary | ICD-10-CM

## 2022-10-12 ENCOUNTER — Telehealth: Payer: Self-pay | Admitting: Family Medicine

## 2022-10-12 DIAGNOSIS — M810 Age-related osteoporosis without current pathological fracture: Secondary | ICD-10-CM

## 2022-10-12 NOTE — Telephone Encounter (Signed)
Prolia due May 4th, needs labs before, when can she schedule  [phone] best contact number

## 2022-10-22 NOTE — Telephone Encounter (Signed)
Prolia VOB initiated via [email or web address] 

## 2022-11-03 ENCOUNTER — Encounter: Payer: Self-pay | Admitting: Family Medicine

## 2022-11-04 DIAGNOSIS — H353132 Nonexudative age-related macular degeneration, bilateral, intermediate dry stage: Secondary | ICD-10-CM | POA: Diagnosis not present

## 2022-11-04 DIAGNOSIS — Z961 Presence of intraocular lens: Secondary | ICD-10-CM | POA: Diagnosis not present

## 2022-11-05 ENCOUNTER — Other Ambulatory Visit (HOSPITAL_COMMUNITY): Payer: Self-pay

## 2022-11-05 NOTE — Telephone Encounter (Signed)
Pt ready for scheduling for Prolia on or after : 11/05/22  Out-of-pocket cost due at time of visit: $302  Primary: [employer] Prolia co-insurance: 20% Admin fee co-insurance: 0%  Secondary: --- Prolia co-insurance:  Admin fee co-insurance:   Medical Benefit Details: Date Benefits were checked: 10/22/22 Deductible: NO/ Coinsurance: 20%/ Admin Fee: 0%  Prior Auth: N/A PA# Expiration Date:    Pharmacy benefit: Copay $200 If patient wants fill through the pharmacy benefit please send prescription to:  [facility] , and include estimated need by date in rx notes. Pharmacy will ship medication directly to the office.  Patient NOT eligible for Prolia Copay Card. Copay Card can make patient's cost as little as $25. Link to apply: [email or web address]://www.[email or web address].[email or web address]  ** This summary of benefits is an estimation of the patient's out-of-pocket cost. Exact cost may very based on individual plan coverage.

## 2022-11-05 NOTE — Telephone Encounter (Signed)
Checking to see if we have authorization for Prolia

## 2022-11-05 NOTE — Telephone Encounter (Signed)
   20% coinsurance, no deductible, no prior auth. needed 

## 2022-11-08 ENCOUNTER — Other Ambulatory Visit (HOSPITAL_COMMUNITY): Payer: Self-pay

## 2022-11-08 ENCOUNTER — Other Ambulatory Visit: Payer: Self-pay

## 2022-11-08 MED ORDER — DENOSUMAB 60 MG/ML ~~LOC~~ SOSY
60.0000 mg | PREFILLED_SYRINGE | Freq: Once | SUBCUTANEOUS | 0 refills | Status: AC
  Filled 2022-11-08: qty 1, 1d supply, fill #0
  Filled 2022-11-10: qty 1, 180d supply, fill #0

## 2022-11-08 NOTE — Addendum Note (Signed)
Addended by: Donnamarie Poag on: 11/08/2022 01:20 PM   Modules accepted: Orders

## 2022-11-08 NOTE — Telephone Encounter (Signed)
Patient aware that there will be charge from [facility] long pharmacy for $200.  Lab app 11/10/22 Nurse visit appointment 11/17/22 Lab orders have been placed future.  Script sent to [facility] requesting that we receive by 5/6.

## 2022-11-10 ENCOUNTER — Other Ambulatory Visit: Payer: Self-pay

## 2022-11-10 ENCOUNTER — Other Ambulatory Visit (HOSPITAL_COMMUNITY): Payer: Self-pay

## 2022-11-10 ENCOUNTER — Other Ambulatory Visit (INDEPENDENT_AMBULATORY_CARE_PROVIDER_SITE_OTHER): Payer: PPO

## 2022-11-10 DIAGNOSIS — M810 Age-related osteoporosis without current pathological fracture: Secondary | ICD-10-CM

## 2022-11-11 ENCOUNTER — Other Ambulatory Visit (HOSPITAL_COMMUNITY): Payer: Self-pay

## 2022-11-11 LAB — BASIC METABOLIC PANEL
BUN: 21 mg/dL (ref 6–23)
CO2: 28 mEq/L (ref 19–32)
Calcium: 9.6 mg/dL (ref 8.4–10.5)
Chloride: 102 mEq/L (ref 96–112)
Creatinine, Ser: 0.92 mg/dL (ref 0.40–1.20)
GFR: 62.41 mL/min (ref >60.00)
Glucose, Bld: 89 mg/dL (ref 70–99)
Potassium: 4.1 mEq/L (ref 3.5–5.1)
Sodium: 137 mEq/L (ref 135–145)

## 2022-11-12 ENCOUNTER — Other Ambulatory Visit: Payer: Self-pay

## 2022-11-12 ENCOUNTER — Other Ambulatory Visit (HOSPITAL_COMMUNITY): Payer: Self-pay

## 2022-11-17 ENCOUNTER — Ambulatory Visit (INDEPENDENT_AMBULATORY_CARE_PROVIDER_SITE_OTHER): Payer: PPO

## 2022-11-17 DIAGNOSIS — M810 Age-related osteoporosis without current pathological fracture: Secondary | ICD-10-CM

## 2022-11-17 DIAGNOSIS — M3501 Sicca syndrome with keratoconjunctivitis: Secondary | ICD-10-CM | POA: Diagnosis not present

## 2022-11-17 MED ORDER — DENOSUMAB 60 MG/ML ~~LOC~~ SOSY
60.0000 mg | PREFILLED_SYRINGE | Freq: Once | SUBCUTANEOUS | Status: AC
  Administered 2022-11-17: 60 mg via SUBCUTANEOUS

## 2022-11-17 NOTE — Progress Notes (Signed)
Per orders of Dr. Javier Gutierrez, injection of Prolia 60 mg [place] in left arm given by Brae Gartman. Patient tolerated injection well.  

## 2022-12-13 ENCOUNTER — Telehealth: Payer: Self-pay

## 2022-12-13 NOTE — Progress Notes (Signed)
Care Management & Coordination Services Pharmacy Team  Reason for Encounter: General Adherence Update   Contacted patient for general health update and medication adherence call.  Unsuccessful outreach. Left voicemail for patient to return call.  Chart Updates:  Recent office visits:  11/17/22 Prolia Injection  Recent consult visits:  09/10/22 Urgent Care Flu-Like Symptoms Results: Covid Positive No med changes  Hospital visits:  None in previous 6 months  Medications: Outpatient Encounter Medications as of 12/13/2022  Medication Sig   Cholecalciferol (VITAMIN D3) 1000 UNITS CAPS Take 1 capsule (1,000 Units total) by mouth daily.   fexofenadine (ALLEGRA) 180 MG tablet Take 180 mg by mouth daily.   hydrochlorothiazide (HYDRODIURIL) 12.5 MG tablet TAKE ONE HALF (1/2) TABLETS BY MOUTH DAILY AS NEEDED   ibuprofen (ADVIL,MOTRIN) 200 MG tablet Take 200 mg by mouth every 6 (six) hours as needed for mild pain or moderate pain.   meclizine (ANTIVERT) 25 MG tablet Take 1 tablet (25 mg total) by mouth 3 (three) times daily as needed for dizziness.   Multiple Vitamins-Minerals (PRESERVISION AREDS 2+MULTI VIT PO) Take 2 capsules by mouth daily.   rosuvastatin (CRESTOR) 5 MG tablet TAKE 1 TABLET BY MOUTH EVERY MONDAY,WEDNESDAY, AND FRIDAY   sertraline (ZOLOFT) 50 MG tablet TAKE 1 TABLET BY MOUTH ONCE A DAY   No facility-administered encounter medications on file as of 12/13/2022.    Recent vitals BP Readings from Last 3 Encounters:  09/10/22 92/63  06/28/22 122/64  05/11/22 120/64   Pulse Readings from Last 3 Encounters:  09/10/22 93  06/28/22 71  05/11/22 69   Wt Readings from Last 3 Encounters:  06/28/22 195 lb 9.6 oz (88.7 kg)  05/11/22 196 lb 2 oz (89 kg)  01/19/22 196 lb 2 oz (89 kg)   BMI Readings from Last 3 Encounters:  06/28/22 33.84 kg/m  05/11/22 33.66 kg/m  01/19/22 33.66 kg/m    Recent lab results    Component Value Date/Time   NA 137 11/10/2022 1453   K 4.1  11/10/2022 1453   CL 102 11/10/2022 1453   CO2 28 11/10/2022 1453   GLUCOSE 89 11/10/2022 1453   BUN 21 11/10/2022 1453   CREATININE 0.92 11/10/2022 1453   CALCIUM 9.6 11/10/2022 1453    Lab Results  Component Value Date   CREATININE 0.92 11/10/2022   GFR 62.41 11/10/2022   GFRNONAA >60 01/09/2021   GFRAA >60 09/18/2019   No results found for: "HGBA1C", "FRUCTOSAMINE", "MICROALBUR"  Lab Results  Component Value Date   CHOL 168 06/21/2022   HDL 47.00 06/21/2022   LDLCALC 93 06/21/2022   LDLDIRECT 170.0 03/23/2012   TRIG 139.0 06/21/2022   CHOLHDL 4 06/21/2022    Care Gaps: Annual wellness visit in last year? Yes 06/28/2022  Star Rating Drugs:  Medication:  Last Fill: [facility] Rosuvastatin 5 mg 12/03/22 90  Al Corpus, PharmD notified  Claudina Lick, [place] Clinical Pharmacy Assistant [phone]

## 2023-01-11 ENCOUNTER — Other Ambulatory Visit (HOSPITAL_COMMUNITY): Payer: Self-pay

## 2023-04-02 ENCOUNTER — Encounter (HOSPITAL_COMMUNITY): Payer: Self-pay

## 2023-04-07 ENCOUNTER — Other Ambulatory Visit: Payer: Self-pay

## 2023-04-07 MED ORDER — COMIRNATY 30 MCG/0.3ML IM SUSY
0.3000 mL | PREFILLED_SYRINGE | Freq: Once | INTRAMUSCULAR | 0 refills | Status: AC
  Filled 2023-04-07: qty 0.3, 1d supply, fill #0

## 2023-04-27 ENCOUNTER — Other Ambulatory Visit (HOSPITAL_COMMUNITY): Payer: Self-pay

## 2023-04-27 ENCOUNTER — Telehealth: Payer: Self-pay | Admitting: Family Medicine

## 2023-04-27 NOTE — Telephone Encounter (Signed)
Patient called in to schedule her Prolia shot. Thank you!

## 2023-04-29 ENCOUNTER — Other Ambulatory Visit (HOSPITAL_COMMUNITY): Payer: Self-pay

## 2023-05-02 NOTE — Telephone Encounter (Signed)
Please verify patient benefits and cost.

## 2023-05-04 ENCOUNTER — Telehealth: Payer: Self-pay

## 2023-05-04 DIAGNOSIS — M810 Age-related osteoporosis without current pathological fracture: Secondary | ICD-10-CM

## 2023-05-04 NOTE — Telephone Encounter (Signed)
Created new encounter for Prolia BIV. Will route encounter back once benefit verification is complete.  

## 2023-05-04 NOTE — Telephone Encounter (Signed)
Prolia VOB initiated via [email or web address]  Next Prolia inj DUE: 05/17/23

## 2023-05-06 DIAGNOSIS — H353132 Nonexudative age-related macular degeneration, bilateral, intermediate dry stage: Secondary | ICD-10-CM | POA: Diagnosis not present

## 2023-05-06 DIAGNOSIS — Z961 Presence of intraocular lens: Secondary | ICD-10-CM | POA: Diagnosis not present

## 2023-05-09 ENCOUNTER — Other Ambulatory Visit (HOSPITAL_COMMUNITY): Payer: Self-pay

## 2023-05-10 ENCOUNTER — Other Ambulatory Visit: Payer: Self-pay

## 2023-05-11 ENCOUNTER — Other Ambulatory Visit (HOSPITAL_COMMUNITY): Payer: Self-pay

## 2023-05-11 ENCOUNTER — Other Ambulatory Visit: Payer: Self-pay

## 2023-05-11 ENCOUNTER — Encounter: Payer: Self-pay | Admitting: Family Medicine

## 2023-05-11 MED ORDER — DENOSUMAB 60 MG/ML ~~LOC~~ SOSY: 60.0000 mg | PREFILLED_SYRINGE | Freq: Once | SUBCUTANEOUS | Status: DC

## 2023-05-11 NOTE — Addendum Note (Signed)
Addended by: Donnamarie Poag on: 05/11/2023 08:07 PM   Modules accepted: Orders

## 2023-05-11 NOTE — Telephone Encounter (Signed)
Yolanda Walsh from [facility] pharmacy called over and had some questions regarding the patient scheduling her Prolia injection. She can be reached at [phone]. Thank you!

## 2023-05-12 ENCOUNTER — Other Ambulatory Visit: Payer: Self-pay

## 2023-05-12 DIAGNOSIS — M810 Age-related osteoporosis without current pathological fracture: Secondary | ICD-10-CM

## 2023-05-12 MED ORDER — DENOSUMAB 60 MG/ML ~~LOC~~ SOSY: 60.0000 mg | PREFILLED_SYRINGE | Freq: Once | SUBCUTANEOUS | Status: DC

## 2023-05-12 NOTE — Telephone Encounter (Signed)
Called patient reviewed all following information including appointment, Co pay due at time of visit and if pick up of injection is needed from outside pharmacy.     Out of pocket for patient:  200  Lab appointment : 11/1  Nurse visit:  11/11  Lab order placed: Yes  Prolia has been  []   Ordered  []   Script sent to local pharmacy for patient to bring   []   Script sent to Specialty pharmacy   [x]   Script sent to [facility] to deliver

## 2023-05-12 NOTE — Telephone Encounter (Signed)
Patient called back reviewed all information with her. Appointments scheduled. She will call if any questions.

## 2023-05-12 NOTE — Telephone Encounter (Signed)
Patient will need to get from pharmacy. She has 200 copay to [place] and will need to get labs done.

## 2023-05-13 ENCOUNTER — Other Ambulatory Visit (HOSPITAL_COMMUNITY): Payer: Self-pay

## 2023-05-13 ENCOUNTER — Other Ambulatory Visit (HOSPITAL_COMMUNITY): Payer: Self-pay | Admitting: Pharmacy Technician

## 2023-05-13 ENCOUNTER — Other Ambulatory Visit: Payer: Self-pay

## 2023-05-13 ENCOUNTER — Other Ambulatory Visit (INDEPENDENT_AMBULATORY_CARE_PROVIDER_SITE_OTHER): Payer: PPO

## 2023-05-13 ENCOUNTER — Other Ambulatory Visit: Payer: Self-pay | Admitting: Family Medicine

## 2023-05-13 DIAGNOSIS — M810 Age-related osteoporosis without current pathological fracture: Secondary | ICD-10-CM

## 2023-05-13 LAB — BASIC METABOLIC PANEL
BUN: 16 mg/dL (ref 6–23)
CO2: 30 meq/L (ref 19–32)
Calcium: 9.3 mg/dL (ref 8.4–10.5)
Chloride: 103 meq/L (ref 96–112)
Creatinine, Ser: 0.83 mg/dL (ref 0.40–1.20)
GFR: 70.37 mL/min (ref >60.00)
Glucose, Bld: 97 mg/dL (ref 70–99)
Potassium: 3.7 meq/L (ref 3.5–5.1)
Sodium: 140 meq/L (ref 135–145)

## 2023-05-13 NOTE — Progress Notes (Signed)
Specialty Pharmacy Refill Coordination Note  Yolanda Walsh is a 72 y.o. female contacted today regarding refills of specialty medication(s) Denosumab   Patient requested Courier to Provider Office   Delivery date: 05/18/23   Verified address: [place] at [facility]   Medication will be filled on 05/17/23.  Refill request sent to MD; Call if any delays. Appointment on 05/23/2023

## 2023-05-17 ENCOUNTER — Other Ambulatory Visit (HOSPITAL_COMMUNITY): Payer: Self-pay

## 2023-05-18 ENCOUNTER — Other Ambulatory Visit (HOSPITAL_COMMUNITY): Payer: Self-pay

## 2023-05-18 ENCOUNTER — Other Ambulatory Visit: Payer: Self-pay

## 2023-05-18 ENCOUNTER — Other Ambulatory Visit: Payer: Self-pay | Admitting: Family Medicine

## 2023-05-19 DIAGNOSIS — Z1231 Encounter for screening mammogram for malignant neoplasm of breast: Secondary | ICD-10-CM | POA: Diagnosis not present

## 2023-05-19 LAB — HM MAMMOGRAPHY

## 2023-05-20 ENCOUNTER — Other Ambulatory Visit: Payer: Self-pay

## 2023-05-20 ENCOUNTER — Telehealth: Payer: Self-pay | Admitting: Family Medicine

## 2023-05-20 NOTE — Telephone Encounter (Signed)
[facility] called to ler office know that they received a notice stating the pt's prolia inj was denied. Pharmacy states they don't see where the inj went elsewhere & wanted to let office know that inj won't come from their pharmacy. Call back # [phone]

## 2023-05-20 NOTE — Progress Notes (Signed)
Refill was denied 11/8. Called office, and they sent a note to MD. Advised office that we would not be able to get the Prolia to office for 11/11 appointment.

## 2023-05-23 ENCOUNTER — Ambulatory Visit: Payer: PPO

## 2023-05-23 ENCOUNTER — Other Ambulatory Visit (HOSPITAL_COMMUNITY): Payer: Self-pay

## 2023-05-23 ENCOUNTER — Other Ambulatory Visit: Payer: Self-pay

## 2023-05-23 ENCOUNTER — Encounter: Payer: Self-pay | Admitting: Family Medicine

## 2023-05-23 MED ORDER — DENOSUMAB 60 MG/ML ~~LOC~~ SOSY
60.0000 mg | PREFILLED_SYRINGE | Freq: Once | SUBCUTANEOUS | 0 refills | Status: AC
  Filled 2023-05-23: qty 1, 180d supply, fill #0

## 2023-05-23 NOTE — Telephone Encounter (Signed)
Patient returned call, advised her of message below. Patient says she spoke with pharmacy and was told prolia was approved for her and had been 3 times. Patient will await response

## 2023-05-23 NOTE — Progress Notes (Signed)
Refill received. Courier to office 11/12. Office has rescheduled appointment.

## 2023-05-23 NOTE — Addendum Note (Signed)
Addended by: Donnamarie Poag on: 05/23/2023 09:20 AM   Modules accepted: Orders

## 2023-05-23 NOTE — Telephone Encounter (Signed)
I have called and let patient know we will call as soon as we have information for her. Please take a look and let me know if approved

## 2023-05-23 NOTE — Telephone Encounter (Signed)
Called patient back she states she called insurance and they advised her that it had been approved and was not sure what the issues are. I have called pharmacy at   [phone]. That was not the right number. Called correct number states that the script request that was sent to our office was denied. I have resent script as requested. I have called patient let her know she just needs to call and pay copay. I have provided her with the number. Scheduled for Friday to give pharmacy time to send. Will give call if not received by Thursday

## 2023-05-27 ENCOUNTER — Ambulatory Visit (INDEPENDENT_AMBULATORY_CARE_PROVIDER_SITE_OTHER): Payer: PPO

## 2023-05-27 DIAGNOSIS — M85852 Other specified disorders of bone density and structure, left thigh: Secondary | ICD-10-CM

## 2023-05-27 DIAGNOSIS — M810 Age-related osteoporosis without current pathological fracture: Secondary | ICD-10-CM

## 2023-05-27 MED ORDER — DENOSUMAB 60 MG/ML ~~LOC~~ SOSY: 60.0000 mg | PREFILLED_SYRINGE | Freq: Once | SUBCUTANEOUS | Status: DC

## 2023-05-27 MED ORDER — DENOSUMAB 60 MG/ML ~~LOC~~ SOSY
60.0000 mg | PREFILLED_SYRINGE | Freq: Once | SUBCUTANEOUS | Status: AC
  Administered 2023-05-27: 60 mg via SUBCUTANEOUS

## 2023-05-27 NOTE — Progress Notes (Signed)
Patient presented for 6-month Prolia injection SQ to left arm given by Alaric Gladwin, CMA. Patient tolerated injection well. 

## 2023-06-08 ENCOUNTER — Other Ambulatory Visit: Payer: Self-pay

## 2023-06-14 ENCOUNTER — Ambulatory Visit (INDEPENDENT_AMBULATORY_CARE_PROVIDER_SITE_OTHER): Payer: PPO

## 2023-06-14 VITALS — Ht 63.75 in | Wt 188.0 lb

## 2023-06-14 DIAGNOSIS — Z0000 Encounter for general adult medical examination without abnormal findings: Secondary | ICD-10-CM

## 2023-06-14 NOTE — Progress Notes (Signed)
Subjective:   Yolanda Walsh is a 72 y.o. female who presents for Medicare Annual (Subsequent) preventive examination.  Visit Complete: Virtual I connected with  Yolanda Walsh on 06/14/23 by a audio enabled telemedicine application and verified that I am speaking with the correct person using two identifiers.  Patient Location: Home  Provider Location: Office/Clinic  I discussed the limitations of evaluation and management by telemedicine. The patient expressed understanding and agreed to proceed.  Vital Signs: Because this visit was a virtual/telehealth visit, some criteria may be missing or patient reported. Any vitals not documented were not able to be obtained and vitals that have been documented are patient reported.  Patient Medicare AWV questionnaire was completed by the patient on (not done); I have confirmed that all information answered by patient is correct and no changes since this date. Cardiac Risk Factors include: advanced age (>67men, >4 women);dyslipidemia;obesity (BMI >30kg/m2)    Objective:    Today's Vitals   06/14/23 1049  Weight: 188 lb (85.3 kg)  Height: 5' 3.75" (1.619 m)   Body mass index is 32.52 kg/m.     06/14/2023   11:00 AM 06/23/2021    9:03 AM 01/09/2021    9:41 AM 06/18/2020    9:13 AM 06/02/2020    7:49 PM 08/01/2019    9:52 AM 07/04/2019    8:56 AM  Advanced Directives  Does Patient Have a Medical Advance Directive? Yes Yes Yes Yes No Yes Yes  Type of Estate agent of [place];Living will Healthcare Power of [place];Living will Healthcare Power of [place];Living will Healthcare Power of [place];Living will  Healthcare Power of [place];Living will Living will;Healthcare Power of Attorney  Does patient want to make changes to medical advance directive?  Yes (MAU/Ambulatory/Procedural Areas - Information given) No - Patient declined   No - Patient declined   Copy of Healthcare Power of Attorney in Chart? No - copy  requested   No - copy requested  No - copy requested Yes - validated most recent copy scanned in chart (See row information)  Would patient like information on creating a medical advance directive?     No - Patient declined      Current Medications (verified) Outpatient Encounter Medications as of 06/14/2023  Medication Sig   fexofenadine (ALLEGRA) 180 MG tablet Take 180 mg by mouth daily.   hydrochlorothiazide (HYDRODIURIL) 12.5 MG tablet TAKE ONE HALF (1/2) TABLETS BY MOUTH DAILY AS NEEDED   ibuprofen (ADVIL,MOTRIN) 200 MG tablet Take 200 mg by mouth every 6 (six) hours as needed for mild pain or moderate pain.   meclizine (ANTIVERT) 25 MG tablet Take 1 tablet (25 mg total) by mouth 3 (three) times daily as needed for dizziness.   Multiple Vitamins-Minerals (PRESERVISION AREDS 2+MULTI VIT PO) Take 2 capsules by mouth daily.   rosuvastatin (CRESTOR) 5 MG tablet TAKE 1 TABLET BY MOUTH EVERY MONDAY,WEDNESDAY, AND FRIDAY   sertraline (ZOLOFT) 50 MG tablet TAKE 1 TABLET BY MOUTH ONCE A DAY   Cholecalciferol (VITAMIN D3) 1000 UNITS CAPS Take 1 capsule (1,000 Units total) by mouth daily.   Facility-Administered Encounter Medications as of 06/14/2023  Medication   denosumab (PROLIA) injection 60 mg   denosumab (PROLIA) injection 60 mg   [START ON 11/24/2023] denosumab (PROLIA) injection 60 mg    Allergies (verified) Patient has no known allergies.   History: Past Medical History:  Diagnosis Date   Allergy    Ankle fracture    right   Anxiety  Cataract    Depression    History of anal fissures 2013   worsened by constipation    History of hemorrhoids    w/o complications   History of rectal fissure    Hyperlipidemia 04/24/2012   Lichen sclerosus    temovate cream   Medial meniscus tear    Osteopenia    dexa 2013, 2016   Trigger finger    Vertigo    Past Surgical History:  Procedure Laterality Date   ABDOMINAL HYSTERECTOMY     CATARACT EXTRACTION W/PHACO Left 07/04/2019    Procedure: CATARACT EXTRACTION PHACO AND INTRAOCULAR LENS PLACEMENT (IOC) LEFT  6.67  00:44.0  15.2%;  Surgeon: Yolanda Mola, MD;  [facility];  Service: Ophthalmology;  Laterality: Left;   CATARACT EXTRACTION W/PHACO Right 08/01/2019   Procedure: CATARACT EXTRACTION PHACO AND INTRAOCULAR LENS PLACEMENT (IOC) RIGHT 5.33  00:45.4  11.8%;  Surgeon: Yolanda Mola, MD;  Location: [facility];  Service: Ophthalmology;  Laterality: Right;   COLONOSCOPY  09/2003   COLONOSCOPY  07/2016   TA, diverticulosis, rpt 5 yrs Yolanda Walsh)   COLONOSCOPY  11/2021   TAx1, diverticulosis, rpt 7 yrs Yolanda Walsh)   dexa  04/2012   T -2.1 hip, -1.2 spine   dexa  03/2015   T -2.4 hip, -1.7 spine   EYE SURGERY  cataract   I & D EXTREMITY Right 03/16/2014   cat bite hand s/p I&D in OR (Yolanda Walsh)   left hand surgery  1962   tendon laceration   PARTIAL HYSTERECTOMY  1980   for prolapse, ovaries remain   Family History  Problem Relation Age of Onset   Cancer Mother        leukemia   Coronary artery disease Mother 58   Coronary artery disease Father 13       MI   Hypertension Sister    Crohn's disease Sister    Parkinsonism Brother 64       deceased   Stroke Brother    CAD Brother 40       7 stents   Hypertension Brother    Stroke Brother    Diabetes Maternal Grandfather    CAD Other 56       stent   Colon cancer Neg Hx    Esophageal cancer Neg Hx    Rectal cancer Neg Hx    Stomach cancer Neg Hx    Social History   Socioeconomic History   Marital status: Widowed    Spouse name: Not on file   Number of children: 2   Years of education: 12   Highest education level: High school graduate  Occupational History   Occupation: Retired  Tobacco Use   Smoking status: Never   Smokeless tobacco: Never  Vaping Use   Vaping status: Never Used  Substance and Sexual Activity   Alcohol use: Not Currently   Drug use: No   Sexual activity: Never  Other Topics Concern   Not  on file  Social History Narrative   Lives with cat    Widow, husband deceased from NHL. Brother died from PD. Sister died 12/30/2019   Daughter lives in [place]. Mother of Yolanda Walsh.    Occupation: retired, prior worked in Set designer    Activity: walking daily at least 30 min    Diet: good water, fruits/vegetables daily - started eating daily harvest plant based soups and smoothies    Right-handed.   No daily use of caffeine.   Social Determinants of  Health   Financial Resource Strain: Low Risk  (06/14/2023)   Overall Financial Resource Strain (CARDIA)    Difficulty of Paying Living Expenses: Not hard at all  Food Insecurity: No Food Insecurity (06/14/2023)   Hunger Vital Sign    Worried About Running Out of Food in the Last Year: Never true    Ran Out of Food in the Last Year: Never true  Transportation Needs: No Transportation Needs (06/14/2023)   PRAPARE - Administrator, Civil Service (Medical): No    Lack of Transportation (Non-Medical): No  Physical Activity: Insufficiently Active (06/14/2023)   Exercise Vital Sign    Days of Exercise per Week: 3 days    Minutes of Exercise per Session: 30 min  Stress: No Stress Concern Present (06/14/2023)   [employer] of Occupational Health - Occupational Stress Questionnaire    Feeling of Stress : Not at all  Social Connections: Socially Isolated (06/14/2023)   Social Connection and Isolation Panel [NHANES]    Frequency of Communication with Friends and Family: More than three times a week    Frequency of Social Gatherings with Friends and Family: More than three times a week    Attends Religious Services: Never    Database administrator or Organizations: No    Attends Banker Meetings: Never    Marital Status: Widowed    Tobacco Counseling Counseling given: Not Answered   Clinical Intake:  Pre-visit preparation completed: Yes  Pain : No/denies pain     BMI - recorded: 32.52 Nutritional  Status: BMI > 30  Obese Nutritional Risks: None Diabetes: No  How often do you need to have someone help you when you read instructions, pamphlets, or other written materials from your doctor or pharmacy?: 1 - Never  Interpreter Needed?: No  Comments: lives alone Information entered by :: B.Sundeep Cary,LPN   Activities of Daily Living    06/14/2023   11:01 AM 06/21/2022    2:22 PM  In your present state of health, do you have any difficulty performing the following activities:  Hearing? 0 0  Vision? 0 0  Difficulty concentrating or making decisions? 0 0  Walking or climbing stairs? 0 0  Dressing or bathing? 0 0  Doing errands, shopping? 0 0  Preparing Food and eating ? N N  Using the Toilet? N N  In the past six months, have you accidently leaked urine? N N  Do you have problems with loss of bowel control? N N  Managing your Medications? N N  Managing your Finances? N N  Housekeeping or managing your Housekeeping? N N    Patient Care Team: Eustaquio Boyden, MD as PCP - General (Family Medicine) [place] Crane, MD as Consulting Physician (Ophthalmology) Flo Shanks, MD as Consulting Physician (Otolaryngology) Kathyrn Sheriff, [facility] (Inactive) as Pharmacist (Pharmacist)  Indicate any recent Medical Services you may have received from other than [facility] providers in the past year (date may be approximate).     Assessment:   This is a routine wellness examination for [facility].  Hearing/Vision screen Hearing Screening - Comments:: Pt says her hearing is good with hearing aids but can hear without them Vision Screening - Comments:: Pt says her vision is alright:macular degenerative Willey Blade   Goals Addressed             This Visit's Progress    Increase physical activity   Not on track    Starting 06/02/2018 and weather permitting, I will  continue to walk 1-2 miles daily.       COMPLETED: Patient Stated   On track    06/13/2019, I will try to work on  losing some weight.     Patient Stated   Not on track    Would like to get back to walking routine     Prevent Falls and Broken Bones-Osteoporosis   On track    Timeframe:  Long-Range Goal Priority:  High Start Date:       06/17/21                      Expected End Date: 06/17/22                       Follow Up Date Dec 2023   - always use handrails on the stairs - always wear shoes or slippers with non-slip sole - get at least 10 minutes of activity every day - keep cell phone with me always -Increasing weight-baring activity (walking) to improve bone density    Why is this important?   When you fall, there are 3 things that control if a bone breaks or not.  These are the fall itself, how hard and the direction that you fall and how fragile your bones are.  Preventing falls is very important for you because of fragile bones.     Notes:        Depression Screen    06/14/2023   10:58 AM 06/28/2022    9:30 AM 06/23/2021    9:07 AM 06/18/2020    9:16 AM 06/13/2019    3:36 PM 06/02/2018   11:53 AM 06/01/2017   11:35 AM  PHQ 2/9 Scores  PHQ - 2 Score 0 0 0 0 0 0 1  PHQ- 9 Score  0  0 0 0 1    Fall Risk    06/14/2023   10:54 AM 06/28/2022    8:41 AM 06/21/2022    2:22 PM 06/23/2021    9:05 AM 06/18/2020    9:15 AM  Fall Risk   Falls in the past year? 0 0 0 0 1  Comment     fell in shower  Number falls in past yr: 0   0 0  Injury with Fall? 0   0 0  Risk for fall due to : No Fall Risks   No Fall Risks Medication side effect  Follow up Education provided;Falls prevention discussed   Falls prevention discussed Falls evaluation completed;Falls prevention discussed    MEDICARE RISK AT HOME: Medicare Risk at Home Any stairs in or around the home?: Yes If so, are there any without handrails?: Yes Home free of loose throw rugs in walkways, pet beds, electrical cords, etc?: Yes Adequate lighting in your home to reduce risk of falls?: Yes Life alert?: No Use of a cane,  walker or w/c?: No Grab bars in the bathroom?: No Shower chair or bench in shower?: Yes Elevated toilet seat or a handicapped toilet?: Yes  TIMED UP AND GO:  Was the test performed?  No    Cognitive Function:    06/18/2020    9:20 AM 06/13/2019    3:41 PM 06/02/2018   11:54 AM 06/01/2017   11:39 AM  MMSE - Mini Mental State Exam  Orientation to time 5 5 5 5   Orientation to Place 5 5 5 5   Registration 3 3 3 3   Attention/ Calculation 5 5 0 0  Recall 3 3 3 3   Language- name 2 objects   0 0  Language- repeat 1 1 1 1   Language- follow 3 step command   3 3  Language- read & follow direction   0 0  Write a sentence   0 0  Copy design   0 0  Total score   20 20        06/14/2023   11:03 AM  6CIT Screen  What Year? 0 points  What month? 0 points  What time? 0 points  Count back from 20 0 points  Months in reverse 0 points  Repeat phrase 0 points  Total Score 0 points    Immunizations Immunization History  Administered Date(s) Administered   Fluad Quad(high Dose 65+) 03/30/2022, 03/29/2023   Influenza Split 04/18/2012   Influenza Whole 04/13/2008   Influenza, High Dose Seasonal PF 03/26/2020, 03/26/2021   Influenza,inj,Quad PF,6+ Mos 04/19/2013, 04/01/2014, 03/28/2015, 03/31/2016, 04/08/2017, 04/06/2018, 03/01/2019   Influenza-Unspecified 03/26/2021   PFIZER(Purple Top)SARS-COV-2 Vaccination 08/04/2019, 08/25/2019, 04/08/2020   Pfizer Covid-19 Vaccine Bivalent Booster 41yrs & up 03/31/2021   Pfizer(Comirnaty)Fall Seasonal Vaccine 12 years and older 04/22/2022, 04/07/2023   Pneumococcal Conjugate-13 05/27/2016   Pneumococcal Polysaccharide-23 06/01/2017   Td 03/13/1999   Tdap 02/04/2011, 09/30/2020   Zoster Recombinant(Shingrix) 09/12/2017, 11/14/2017   Zoster, Live 05/23/2012    TDAP status: Up to date  Flu Vaccine status: Up to date  Pneumococcal vaccine status: Up to date  Covid-19 vaccine status: Completed vaccines  Qualifies for Shingles Vaccine? Yes    Zostavax completed Yes   Shingrix Completed?: Yes  Screening Tests Health Maintenance  Topic Date Due   DEXA SCAN  05/06/2024   MAMMOGRAM  05/18/2024   Medicare Annual Wellness (AWV)  06/13/2024   Colonoscopy  11/23/2028   DTaP/Tdap/Td (4 - Td or Tdap) 10/01/2030   Pneumonia Vaccine 20+ Years old  Completed   INFLUENZA VACCINE  Completed   COVID-19 Vaccine  Completed   Hepatitis C Screening  Completed   Zoster Vaccines- Shingrix  Completed   HPV VACCINES  Aged Out    Health Maintenance  There are no preventive care reminders to display for this patient.   Colorectal cancer screening: Type of screening: Colonoscopy. Completed 11/23/2021. Repeat every 7 years  Mammogram status: Completed 05/19/2023. Repeat every year  Bone Density status: Completed 05/06/2022. Results reflect: Bone density results: OSTEOPOROSIS. Repeat every 3-5 years.  Lung Cancer Screening: (Low Dose CT Chest recommended if Age 37-80 years, 20 pack-year currently smoking OR have quit w/in 15years.) does not qualify.   Lung Cancer Screening Referral: no  Additional Screening:  Hepatitis C Screening: does not qualify; Completed 05/14/2015  Vision Screening: Recommended annual ophthalmology exams for early detection of glaucoma and other disorders of the eye. Is the patient up to date with their annual eye exam?  Yes  Who is the provider or what is the name of the office in which the patient attends annual eye exams? Dr Brooke Dare If pt is not established with a provider, would they like to be referred to a provider to establish care? No .   Dental Screening: Recommended annual dental exams for proper oral hygiene  Diabetic Foot Exam:   Community Resource Referral / Chronic Care Management: CRR required this visit?  No   CCM required this visit?  No     Plan:     I have personally reviewed and noted the following in the patient's chart:   Medical and social history  Use of alcohol, tobacco or illicit  drugs  Current medications and supplements including opioid prescriptions. Patient is not currently taking opioid prescriptions. Functional ability and status Nutritional status Physical activity Advanced directives List of other physicians Hospitalizations, surgeries, and ER visits in previous 12 months Vitals Screenings to include cognitive, depression, and falls Referrals and appointments  In addition, I have reviewed and discussed with patient certain preventive protocols, quality metrics, and best practice recommendations. A written personalized care plan for preventive services as well as general preventive health recommendations were provided to patient.     Sue Lush, LPN   [id number]   After Visit Summary: (MyChart) Due to this being a telephonic visit, the after visit summary with patients personalized plan was offered to patient via MyChart   Nurse Notes: The patient states she is doing well and has no concerns or questions at this time.

## 2023-06-14 NOTE — Patient Instructions (Signed)
Ms. Deitz , Thank you for taking time to come for your Medicare Wellness Visit. I appreciate your ongoing commitment to your health goals. Please review the following plan we discussed and let me know if I can assist you in the future.   Referrals/Orders/Follow-Ups/Clinician Recommendations: none  This is a list of the screening recommended for you and due dates:  Health Maintenance  Topic Date Due   DEXA scan (bone density measurement)  05/06/2024   Mammogram  05/18/2024   Medicare Annual Wellness Visit  06/13/2024   Colon Cancer Screening  11/23/2028   DTaP/Tdap/Td vaccine (4 - Td or Tdap) 10/01/2030   Pneumonia Vaccine  Completed   Flu Shot  Completed   COVID-19 Vaccine  Completed   Hepatitis C Screening  Completed   Zoster (Shingles) Vaccine  Completed   HPV Vaccine  Aged Out    Advanced directives: (Copy Requested) Please bring a copy of your health care power of attorney and living will to the office to be added to your chart at your convenience.  Next Medicare Annual Wellness Visit scheduled for next year: Yes 06/14/2024 @ 10:50am telephone

## 2023-06-16 ENCOUNTER — Other Ambulatory Visit: Payer: Self-pay | Admitting: Family Medicine

## 2023-06-16 DIAGNOSIS — E785 Hyperlipidemia, unspecified: Secondary | ICD-10-CM

## 2023-06-16 DIAGNOSIS — M810 Age-related osteoporosis without current pathological fracture: Secondary | ICD-10-CM

## 2023-06-16 DIAGNOSIS — D649 Anemia, unspecified: Secondary | ICD-10-CM

## 2023-06-16 DIAGNOSIS — R5383 Other fatigue: Secondary | ICD-10-CM

## 2023-06-16 DIAGNOSIS — E559 Vitamin D deficiency, unspecified: Secondary | ICD-10-CM

## 2023-06-23 ENCOUNTER — Encounter: Payer: Self-pay | Admitting: Pharmacist

## 2023-06-23 NOTE — Progress Notes (Signed)
Pharmacy Quality Measure Review  This patient is appearing on a report for being at risk of failing the adherence measure for cholesterol (statin) medications this calendar year.   Medication: rosuvastatin 5 mg tablet Last fill date: 03/08/23 for 90 day supply  Insurance report was not up to date. No action needed at this time.  Medication refilled on 06/10/23 fir 90 day supply

## 2023-06-24 ENCOUNTER — Other Ambulatory Visit (INDEPENDENT_AMBULATORY_CARE_PROVIDER_SITE_OTHER): Payer: PPO

## 2023-06-24 DIAGNOSIS — E559 Vitamin D deficiency, unspecified: Secondary | ICD-10-CM | POA: Diagnosis not present

## 2023-06-24 DIAGNOSIS — M810 Age-related osteoporosis without current pathological fracture: Secondary | ICD-10-CM | POA: Diagnosis not present

## 2023-06-24 DIAGNOSIS — D649 Anemia, unspecified: Secondary | ICD-10-CM

## 2023-06-24 DIAGNOSIS — E785 Hyperlipidemia, unspecified: Secondary | ICD-10-CM

## 2023-06-24 DIAGNOSIS — R5383 Other fatigue: Secondary | ICD-10-CM | POA: Diagnosis not present

## 2023-06-24 LAB — HEPATIC FUNCTION PANEL
ALT: 9 U/L (ref 0–35)
AST: 16 U/L (ref 0–37)
Albumin: 4.2 g/dL (ref 3.5–5.2)
Alkaline Phosphatase: 55 U/L (ref 39–117)
Bilirubin, Direct: 0.1 mg/dL (ref 0.0–0.3)
Total Bilirubin: 0.5 mg/dL (ref 0.2–1.2)
Total Protein: 7.1 g/dL (ref 6.0–8.3)

## 2023-06-24 LAB — CBC WITH DIFFERENTIAL/PLATELET
Basophils Absolute: 0 10*3/uL (ref 0.0–0.1)
Basophils Relative: 0.2 % (ref 0.0–3.0)
Eosinophils Absolute: 0.1 10*3/uL (ref 0.0–0.7)
Eosinophils Relative: 1.1 % (ref 0.0–5.0)
HCT: 36.7 % (ref 36.0–46.0)
Hemoglobin: 11.9 g/dL — ABNORMAL LOW (ref 12.0–15.0)
Lymphocytes Relative: 33.3 % (ref 12.0–46.0)
Lymphs Abs: 2.6 10*3/uL (ref 0.7–4.0)
MCHC: 32.4 g/dL (ref 30.0–36.0)
MCV: 86 fL (ref 78.0–100.0)
Monocytes Absolute: 0.7 10*3/uL (ref 0.1–1.0)
Monocytes Relative: 9.2 % (ref 3.0–12.0)
Neutro Abs: 4.5 10*3/uL (ref 1.4–7.7)
Neutrophils Relative %: 56.2 % (ref 43.0–77.0)
Platelets: 273 10*3/uL (ref 150.0–400.0)
RBC: 4.27 Mil/uL (ref 3.87–5.11)
RDW: 14.8 % (ref 11.5–15.5)
WBC: 8 10*3/uL (ref 4.0–10.5)

## 2023-06-24 LAB — LIPID PANEL
Cholesterol: 204 mg/dL — ABNORMAL HIGH (ref 0–200)
HDL: 53.4 mg/dL (ref >39.00)
LDL Cholesterol: 127 mg/dL — ABNORMAL HIGH (ref 0–99)
NonHDL: 150.16
Total CHOL/HDL Ratio: 4
Triglycerides: 115 mg/dL (ref 0.0–149.0)
VLDL: 23 mg/dL (ref 0.0–40.0)

## 2023-06-24 LAB — TSH: TSH: 3.94 u[IU]/mL (ref 0.35–5.50)

## 2023-06-24 LAB — IBC PANEL
Iron: 36 ug/dL — ABNORMAL LOW (ref 42–145)
Saturation Ratios: 8.7 % — ABNORMAL LOW (ref 20.0–50.0)
TIBC: 415.8 ug/dL (ref 250.0–450.0)
Transferrin: 297 mg/dL (ref 212.0–360.0)

## 2023-06-24 LAB — FERRITIN: Ferritin: 5.7 ng/mL — ABNORMAL LOW (ref 10.0–291.0)

## 2023-06-24 LAB — T4, FREE: Free T4: 0.74 ng/dL (ref 0.60–1.60)

## 2023-06-24 LAB — VITAMIN B12: Vitamin B-12: 96 pg/mL — ABNORMAL LOW (ref 211–911)

## 2023-06-24 LAB — FOLATE: Folate: 12.5 ng/mL (ref >5.9)

## 2023-06-24 LAB — VITAMIN D 25 HYDROXY (VIT D DEFICIENCY, FRACTURES): VITD: 45.47 ng/mL (ref 30.00–100.00)

## 2023-07-01 ENCOUNTER — Ambulatory Visit (INDEPENDENT_AMBULATORY_CARE_PROVIDER_SITE_OTHER): Payer: PPO | Admitting: Family Medicine

## 2023-07-01 ENCOUNTER — Encounter: Payer: Self-pay | Admitting: Family Medicine

## 2023-07-01 VITALS — BP 116/66 | HR 70 | Temp 98.1°F | Ht 63.75 in | Wt 187.0 lb

## 2023-07-01 DIAGNOSIS — E785 Hyperlipidemia, unspecified: Secondary | ICD-10-CM | POA: Diagnosis not present

## 2023-07-01 DIAGNOSIS — D509 Iron deficiency anemia, unspecified: Secondary | ICD-10-CM | POA: Insufficient documentation

## 2023-07-01 DIAGNOSIS — Z0000 Encounter for general adult medical examination without abnormal findings: Secondary | ICD-10-CM | POA: Diagnosis not present

## 2023-07-01 DIAGNOSIS — E66811 Obesity, class 1: Secondary | ICD-10-CM

## 2023-07-01 DIAGNOSIS — F411 Generalized anxiety disorder: Secondary | ICD-10-CM

## 2023-07-01 DIAGNOSIS — H8102 Meniere's disease, left ear: Secondary | ICD-10-CM

## 2023-07-01 DIAGNOSIS — E538 Deficiency of other specified B group vitamins: Secondary | ICD-10-CM | POA: Diagnosis not present

## 2023-07-01 DIAGNOSIS — E611 Iron deficiency: Secondary | ICD-10-CM | POA: Insufficient documentation

## 2023-07-01 DIAGNOSIS — Z7189 Other specified counseling: Secondary | ICD-10-CM

## 2023-07-01 DIAGNOSIS — D649 Anemia, unspecified: Secondary | ICD-10-CM

## 2023-07-01 DIAGNOSIS — M810 Age-related osteoporosis without current pathological fracture: Secondary | ICD-10-CM

## 2023-07-01 LAB — POC URINALSYSI DIPSTICK (AUTOMATED)
Blood, UA: NEGATIVE
Glucose, UA: NEGATIVE
Leukocytes, UA: NEGATIVE
Nitrite, UA: NEGATIVE
Protein, UA: POSITIVE — AB
Spec Grav, UA: >=1.03 — AB (ref 1.010–1.025)
Urobilinogen, UA: 0.2 U/dL
pH, UA: 5.5 (ref 5.0–8.0)

## 2023-07-01 MED ORDER — ROSUVASTATIN CALCIUM 5 MG PO TABS: 5.0000 mg | ORAL_TABLET | ORAL | 4 refills | Status: DC

## 2023-07-01 MED ORDER — IRON (FERROUS SULFATE) 325 (65 FE) MG PO TABS: 325.0000 mg | ORAL_TABLET | ORAL | Status: DC

## 2023-07-01 MED ORDER — SERTRALINE HCL 50 MG PO TABS: 50.0000 mg | ORAL_TABLET | Freq: Every day | ORAL | 3 refills | Status: DC

## 2023-07-01 MED ORDER — HYDROCHLOROTHIAZIDE 12.5 MG PO TABS: 6.2500 mg | ORAL_TABLET | Freq: Every day | ORAL | 3 refills | Status: DC | PRN

## 2023-07-01 MED ORDER — CYANOCOBALAMIN 1000 MCG/ML IJ SOLN
1000.0000 ug | Freq: Once | INTRAMUSCULAR | Status: AC
  Administered 2023-07-01: 1000 ug via INTRAMUSCULAR

## 2023-07-01 NOTE — Assessment & Plan Note (Signed)
Previously discussed.

## 2023-07-01 NOTE — Assessment & Plan Note (Signed)
Preventative protocols reviewed and updated unless pt declined. Discussed healthy diet and lifestyle.  

## 2023-07-01 NOTE — Assessment & Plan Note (Signed)
Encourage healthy diet and lifestyle choices to affect sustainable weight loss.  Discussed weight loss noted.

## 2023-07-01 NOTE — Assessment & Plan Note (Signed)
Has seen ENT. Continue hydrochlorothiazide 12.5mg  1/2 tab PRN.

## 2023-07-01 NOTE — Assessment & Plan Note (Signed)
Chronic, stable on sertraline 50mg  daily.

## 2023-07-01 NOTE — Assessment & Plan Note (Signed)
Continue prolia - started 05/2022. Latest injection 05/27/2023.

## 2023-07-01 NOTE — Assessment & Plan Note (Signed)
Chronic, stable on crestor MWF. Reviewed diet changes to help lower LDL.  The 10-year ASCVD risk score (Arnett DK, et al., 2019) is: 13%   Values used to calculate the score:     Age: 72 years     Sex: Female     Is Non-Hispanic African American: No     Diabetic: No     Tobacco smoker: No     Systolic Blood Pressure: 116 mmHg     Is BP treated: Yes     HDL Cholesterol: 53.4 mg/dL     Total Cholesterol: 204 mg/dL

## 2023-07-01 NOTE — Assessment & Plan Note (Signed)
Markedly low levels. UTD colonoscopy.  No vaginal bleeding. Check UA.  Start oral iron MWF dosing

## 2023-07-01 NOTE — Assessment & Plan Note (Signed)
Levels stable on daily replacement 1000 units

## 2023-07-01 NOTE — Progress Notes (Signed)
Ph: [phone] Fax: [phone]   Patient ID: Yolanda Walsh, female    DOB: Nov 07, 1950, 72 y.o.   MRN: [id number]  This visit was conducted in person.  BP 116/66   Pulse 70   Temp 98.1 F (36.7 C) (Oral)   Ht 5' 3.75" (1.619 m)   Wt 187 lb (84.8 kg)   SpO2 98%   BMI 32.35 kg/m    CC: CPE Subjective:   HPI: Yolanda Walsh is a 72 y.o. female presenting on 07/01/2023 for Annual Exam (MCR prt 2 [AWV- 06/14/23].)   Saw health advisor earlier in the month for medicare wellness visit. Note reviewed.   No results found.  Flowsheet Row Clinical Support from 06/14/2023 in [facility] [place]  PHQ-2 Total Score 0          06/14/2023   10:54 AM 06/28/2022    8:41 AM 06/21/2022    2:22 PM 06/23/2021    9:05 AM 06/18/2020    9:15 AM  Fall Risk   Falls in the past year? 0 0 0 0 1  Comment     fell in shower  Number falls in past yr: 0   0 0  Injury with Fall? 0   0 0  Risk for fall due to : No Fall Risks   No Fall Risks Medication side effect  Follow up Education provided;Falls prevention discussed   Falls prevention discussed Falls evaluation completed;Falls prevention discussed    Last prolia shot 05/27/2023.  8 lb weight loss in the past year. Has not been trying to lose weight  Low vitamin b12 levels - notes significant fatigue. No significant paresthesias.   Meniere's disease seeing ENT. Trouble with full dose diazide (syncope), tolerates hctz 6.25mg  daily. No recent vertigo. Wears hearing aides.    Preventative: Colonoscopy 07/2016 - TA, diverticulosis, rpt 5 yrs [name]).  Colonoscopy 11/2021 - TAx1, diverticulosis, rpt 7 yrs [name]) Well woman with Dr. Senaida Ores h/o partal hysterectomy. Sees yearly (last seen 07/2023). Ovaries remain.  Mammogram - 05/2023 Birads1 @ [facility] DEXA 04/2012, 03/2015, 04/2017, 05/2019 T -2.4 hip, -1.8 spine  DEXA (04/2022) @ [facility] - T -2.6 L fem neck, -2.3 spine.  Prolia started 05/18/2022, tolerating  well. Lung cancer screening - not eligible  Flu - yearly COVID vaccine Pfizer 07/2019, 08/2019, booster 03/2020, bivalent booster 03/2021, booster 04/2022, 03/2023 Prevnar-13 2017, pneumovax 2018 - localized reaction to this.  Tdap 2012, 09/2020.  RSV - declined Zostavax - 2013 Shingrix - 09/2017, 11/2017  Advanced directive - scanned in chart 06/2019. Daughter and son are HCPOA. Does not want prolonged life support. Ok with temporary measures.  Seat belt use discussed.  Sunscreen use discussed. No changing moles on skin. Has had difficulty getting in to see dermatologist.  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  Daughter now local, son local Jeanine Luz).  Occupation: retired, prior worked in Set designer  Activity: no regular exercise  Diet: good water, fruits/vegetables daily, good calcium in diet     Relevant past medical, surgical, family and social history reviewed and updated as indicated. Interim medical history since our last visit reviewed. Allergies and medications reviewed and updated. Outpatient Medications Prior to Visit  Medication Sig Dispense Refill   Chlorpheniramine Maleate (ALLERGY  PO) Take by mouth daily.     Cholecalciferol (VITAMIN D3) 1000 UNITS CAPS Take 1 capsule (1,000 Units total) by mouth daily. 30 capsule    ibuprofen (ADVIL,MOTRIN) 200 MG tablet Take 200 mg by mouth every 6 (six) hours as needed for mild pain or moderate pain.     meclizine (ANTIVERT) 25 MG tablet Take 1 tablet (25 mg total) by mouth 3 (three) times daily as needed for dizziness. 30 tablet 0   Multiple Vitamins-Minerals (PRESERVISION AREDS 2+MULTI VIT PO) Take 2 capsules by mouth daily.     hydrochlorothiazide (HYDRODIURIL) 12.5 MG tablet TAKE ONE HALF (1/2) TABLETS BY MOUTH DAILY AS NEEDED 45 tablet  4   rosuvastatin (CRESTOR) 5 MG tablet TAKE 1 TABLET BY MOUTH EVERY MONDAY,WEDNESDAY, AND FRIDAY 40 tablet 3   sertraline (ZOLOFT) 50 MG tablet TAKE 1 TABLET BY MOUTH ONCE A DAY 90 tablet 3   fexofenadine (ALLEGRA) 180 MG tablet Take 180 mg by mouth daily.     Facility-Administered Medications Prior to Visit  Medication Dose Route Frequency Provider Last Rate Last Admin   denosumab (PROLIA) injection 60 mg  60 mg Subcutaneous Once Eustaquio Boyden, MD       denosumab (PROLIA) injection 60 mg  60 mg Subcutaneous Once Eustaquio Boyden, MD       Melene Muller ON 11/24/2023] denosumab (PROLIA) injection 60 mg  60 mg Subcutaneous Once Eustaquio Boyden, MD         Per HPI unless specifically indicated in ROS section below Review of Systems  Constitutional:  Positive for appetite change (taste changes). Negative for activity change, chills, fatigue, fever and unexpected weight change.  HENT:  Negative for hearing loss.   Eyes:  Negative for visual disturbance.  Respiratory:  Negative for cough, chest tightness, shortness of breath and wheezing.   Cardiovascular:  Negative for chest pain, palpitations and leg swelling.  Gastrointestinal:  Negative for abdominal distention, abdominal pain, blood in stool, constipation, diarrhea, nausea and vomiting.  Genitourinary:  Negative for difficulty urinating and hematuria.  Musculoskeletal:  Negative for arthralgias, myalgias and neck pain.  Skin:  Negative for rash.  Neurological:  Negative for dizziness, seizures, syncope and headaches.  Hematological:  Negative for adenopathy. Does not bruise/bleed easily.  Psychiatric/Behavioral:  Negative for dysphoric mood. The patient is not nervous/anxious.     Objective:  BP 116/66   Pulse 70   Temp 98.1 F (36.7 C) (Oral)   Ht 5' 3.75" (1.619 m)   Wt 187 lb (84.8 kg)   SpO2 98%   BMI 32.35 kg/m   Wt Readings from Last 3 Encounters:  07/01/23 187 lb (84.8 kg)  06/14/23 188 lb (85.3 kg)  06/28/22 195 lb 9.6  oz (88.7 kg)      Physical Exam Vitals and nursing note reviewed.  Constitutional:      Appearance: Normal appearance. She is not ill-appearing.  HENT:     Head: Normocephalic and atraumatic.     Right Ear: Tympanic membrane, ear canal and external ear normal. There is no impacted cerumen.     Left Ear: Tympanic membrane, ear canal and external ear normal. There is no impacted cerumen.     Mouth/Throat:     Mouth: Mucous membranes are moist.     Pharynx: Oropharynx is clear. No oropharyngeal exudate or posterior oropharyngeal erythema.  Eyes:     General:        Right eye: No discharge.        Left eye: No discharge.  Extraocular Movements: Extraocular movements intact.     Conjunctiva/sclera: Conjunctivae normal.     Pupils: Pupils are equal, round, and reactive to light.  Neck:     Thyroid: No thyroid mass or thyromegaly.     Vascular: No carotid bruit.  Cardiovascular:     Rate and Rhythm: Normal rate and regular rhythm.     Pulses: Normal pulses.     Heart sounds: Normal heart sounds. No murmur heard. Pulmonary:     Effort: Pulmonary effort is normal. No respiratory distress.     Breath sounds: Normal breath sounds. No wheezing, rhonchi or rales.  Abdominal:     General: Bowel sounds are normal. There is no distension.     Palpations: Abdomen is soft. There is no mass.     Tenderness: There is no abdominal tenderness. There is no guarding or rebound.     Hernia: No hernia is present.  Musculoskeletal:     Cervical back: Normal range of motion and neck supple. No rigidity.     Right lower leg: No edema.     Left lower leg: No edema.  Lymphadenopathy:     Cervical: No cervical adenopathy.  Skin:    General: Skin is warm and dry.     Findings: No rash.  Neurological:     General: No focal deficit present.     Mental Status: She is alert. Mental status is at baseline.  Psychiatric:        Mood and Affect: Mood normal.        Behavior: Behavior normal.        Results for orders placed or performed in visit on 07/01/23  POCT Urinalysis Dipstick (Automated)   Collection Time: 07/01/23  9:22 AM  Result Value Ref Range   Color, UA yellow    Clarity, UA clear    Glucose, UA Negative Negative   Bilirubin, UA 1+    Ketones, UA +/-    Spec Grav, UA >=1.030 (A) 1.010 - 1.025   Blood, UA negative    pH, UA 5.5 5.0 - 8.0   Protein, UA Positive (A) Negative   Urobilinogen, UA 0.2 0.2 or 1.0 E.U./dL   Nitrite, UA negative    Leukocytes, UA Negative Negative      06/14/2023   10:58 AM 06/28/2022    9:30 AM 06/23/2021    9:07 AM 06/18/2020    9:16 AM 06/13/2019    3:36 PM  Depression screen PHQ 2/9  Decreased Interest 0 0 0 0 0  Down, Depressed, Hopeless 0 0 0 0 0  PHQ - 2 Score 0 0 0 0 0  Altered sleeping  0  0 0  Tired, decreased energy  0  0 0  Change in appetite  0  0 0  Feeling bad or failure about yourself   0  0 0  Trouble concentrating  0  0 0  Moving slowly or fidgety/restless  0  0 0  Suicidal thoughts  0  0 0  PHQ-9 Score  0  0 0  Difficult doing work/chores    Not difficult at all Not difficult at all       06/28/2022    9:30 AM  GAD 7 : Generalized Anxiety Score  Nervous, Anxious, on Edge 0  Control/stop worrying 0  Worry too much - different things 0  Trouble relaxing 0  Restless 0  Easily annoyed or irritable 0  Afraid - awful might happen 0  Total GAD  7 Score 0   Assessment & Plan:   Problem List Items Addressed This Visit     Healthcare maintenance - Primary (Chronic)   Preventative protocols reviewed and updated unless pt declined. Discussed healthy diet and lifestyle.       Advanced care planning/counseling discussion (Chronic)   Previously discussed      Osteoporosis   Continue prolia - started 05/2022. Latest injection 05/27/2023.       GAD (generalized anxiety disorder)   Chronic, stable on sertraline 50mg  daily.       Relevant Medications   sertraline (ZOLOFT) 50 MG tablet   Hyperlipidemia    Chronic, stable on crestor MWF. Reviewed diet changes to help lower LDL.  The 10-year ASCVD risk score (Arnett DK, et al., 2019) is: 13%   Values used to calculate the score:     Age: 49 years     Sex: Female     Is Non-Hispanic African American: No     Diabetic: No     Tobacco smoker: No     Systolic Blood Pressure: 116 mmHg     Is BP treated: Yes     HDL Cholesterol: 53.4 mg/dL     Total Cholesterol: 204 mg/dL       Relevant Medications   rosuvastatin (CRESTOR) 5 MG tablet   hydrochlorothiazide (HYDRODIURIL) 12.5 MG tablet   Obesity, Class I, BMI 30-34.9   Encourage healthy diet and lifestyle choices to affect sustainable weight loss.  Discussed weight loss noted.       Meniere disease, left   Has seen ENT. Continue hydrochlorothiazide 12.5mg  1/2 tab PRN.       Relevant Medications   hydrochlorothiazide (HYDRODIURIL) 12.5 MG tablet   Anemia   Relevant Medications   Iron, Ferrous Sulfate, 325 (65 Fe) MG TABS   Other Relevant Orders   POCT Urinalysis Dipstick (Automated) (Completed)   Vitamin B12 deficiency   Start monthly B12 shots first one today.  Rec for 6 months then change to daily oral replacement.       Iron deficiency anemia   Markedly low levels. UTD colonoscopy.  No vaginal bleeding. Check UA.  Start oral iron MWF dosing      Relevant Medications   Iron, Ferrous Sulfate, 325 (65 Fe) MG TABS   Other Relevant Orders   POCT Urinalysis Dipstick (Automated) (Completed)     Meds ordered this encounter  Medications   Iron, Ferrous Sulfate, 325 (65 Fe) MG TABS    Sig: Take 325 mg by mouth every Monday, Wednesday, and Friday.   rosuvastatin (CRESTOR) 5 MG tablet    Sig: Take 1 tablet (5 mg total) by mouth every Monday, Wednesday, and Friday.    Dispense:  40 tablet    Refill:  4   cyanocobalamin (VITAMIN B12) injection 1,000 mcg   sertraline (ZOLOFT) 50 MG tablet    Sig: Take 1 tablet (50 mg total) by mouth daily.    Dispense:  90 tablet     Refill:  3   hydrochlorothiazide (HYDRODIURIL) 12.5 MG tablet    Sig: Take 0.5 tablets (6.25 mg total) by mouth daily as needed (dizziness/vertigo).    Dispense:  30 tablet    Refill:  3    Orders Placed This Encounter  Procedures   POCT Urinalysis Dipstick (Automated)    Patient Instructions  Urinalysis today  B12 shot today then monthly for 6 months, then start vitamin b12 over the counter daily.  Iron levels were low -  start oral iron 325mg  (65FE) over the counter every other day or MWF.   Both of these thinks will help fatigue.  Good to see you today Return as needed or in 1 year for next physical. Schedule lab visit only in 3 months to recheck levels   Follow up plan: Return in about 1 year (around 06/30/2024) for annual exam, prior fasting for blood work, medicare wellness visit.  Eustaquio Boyden, MD

## 2023-07-01 NOTE — Assessment & Plan Note (Signed)
Start monthly B12 shots first one today.  Rec for 6 months then change to daily oral replacement.

## 2023-07-01 NOTE — Patient Instructions (Addendum)
Urinalysis today  B12 shot today then monthly for 6 months, then start vitamin b12 over the counter daily.  Iron levels were low - start oral iron 325mg  (65FE) over the counter every other day or MWF.   Both of these thinks will help fatigue.  Good to see you today Return as needed or in 1 year for next physical. Schedule lab visit only in 3 months to recheck levels

## 2023-07-21 DIAGNOSIS — L900 Lichen sclerosus et atrophicus: Secondary | ICD-10-CM | POA: Diagnosis not present

## 2023-07-21 DIAGNOSIS — Z01411 Encounter for gynecological examination (general) (routine) with abnormal findings: Secondary | ICD-10-CM | POA: Diagnosis not present

## 2023-07-21 DIAGNOSIS — Z01419 Encounter for gynecological examination (general) (routine) without abnormal findings: Secondary | ICD-10-CM | POA: Diagnosis not present

## 2023-07-26 ENCOUNTER — Ambulatory Visit (INDEPENDENT_AMBULATORY_CARE_PROVIDER_SITE_OTHER): Payer: PPO | Admitting: Internal Medicine

## 2023-07-26 VITALS — BP 102/60 | HR 95 | Temp 100.2°F | Ht 63.75 in | Wt 191.0 lb

## 2023-07-26 DIAGNOSIS — J069 Acute upper respiratory infection, unspecified: Secondary | ICD-10-CM | POA: Insufficient documentation

## 2023-07-26 NOTE — Assessment & Plan Note (Signed)
 Likely flu from her symptoms---though attenuated COVID negative Discussed analgesics If worsens later in the week---will give augmentin 875 bid x 7 days

## 2023-07-26 NOTE — Progress Notes (Signed)
 Subjective:    Patient ID: Yolanda Walsh, female    DOB: 1951/06/02, 73 y.o.   MRN: [id number]  HPI Here due to respiratory infection  Started 3 days ago---sinus aching Cough, ear pain, nasal drainage Throat feels puffy No fever, shakes or sweats Slight chilled feeling---fatigued and achy No SOB--but just sitting around  Tried mucinex--not helping  Current Outpatient Medications on File Prior to Visit  Medication Sig Dispense Refill   Chlorpheniramine Maleate (ALLERGY PO) Take by mouth daily.     Cholecalciferol (VITAMIN D3) 1000 UNITS CAPS Take 1 capsule (1,000 Units total) by mouth daily. 30 capsule    hydrochlorothiazide  (HYDRODIURIL ) 12.5 MG tablet Take 0.5 tablets (6.25 mg total) by mouth daily as needed (dizziness/vertigo). 30 tablet 3   ibuprofen (ADVIL,MOTRIN) 200 MG tablet Take 200 mg by mouth every 6 (six) hours as needed for mild pain or moderate pain.     Iron , Ferrous Sulfate , 325 (65 Fe) MG TABS Take 325 mg by mouth every Monday, Wednesday, and Friday.     meclizine  (ANTIVERT ) 25 MG tablet Take 1 tablet (25 mg total) by mouth 3 (three) times daily as needed for dizziness. 30 tablet 0   Multiple Vitamins-Minerals (PRESERVISION AREDS 2+MULTI VIT PO) Take 2 capsules by mouth daily.     rosuvastatin  (CRESTOR ) 5 MG tablet Take 1 tablet (5 mg total) by mouth every Monday, Wednesday, and Friday. 40 tablet 4   sertraline  (ZOLOFT ) 50 MG tablet Take 1 tablet (50 mg total) by mouth daily. 90 tablet 3   Current Facility-Administered Medications on File Prior to Visit  Medication Dose Route Frequency Provider Last Rate Last Admin   denosumab  (PROLIA ) injection 60 mg  60 mg Subcutaneous Once Gutierrez, Javier, MD       denosumab  (PROLIA ) injection 60 mg  60 mg Subcutaneous Once Gutierrez, Javier, MD       [START ON 11/24/2023] denosumab  (PROLIA ) injection 60 mg  60 mg Subcutaneous Once Rilla Baller, MD        No Known Allergies  Past Medical History:  Diagnosis Date    Allergy    Ankle fracture    right   Anxiety    Cataract    Depression    History of anal fissures 2013   worsened by constipation    History of hemorrhoids    w/o complications   History of rectal fissure    Hyperlipidemia 04/24/2012   Lichen sclerosus    temovate cream   Medial meniscus tear    Osteopenia    dexa 2013, 2016   Trigger finger    Vertigo     Past Surgical History:  Procedure Laterality Date   ABDOMINAL HYSTERECTOMY     CATARACT EXTRACTION W/PHACO Left 07/04/2019   Procedure: CATARACT EXTRACTION PHACO AND INTRAOCULAR LENS PLACEMENT (IOC) LEFT  6.67  00:44.0  15.2%;  Surgeon: Mittie Gaskin, MD;  Location: [facility];  Service: Ophthalmology;  Laterality: Left;   CATARACT EXTRACTION W/PHACO Right 08/01/2019   Procedure: CATARACT EXTRACTION PHACO AND INTRAOCULAR LENS PLACEMENT (IOC) RIGHT 5.33  00:45.4  11.8%;  Surgeon: Mittie Gaskin, MD;  Location: [facility];  Service: Ophthalmology;  Laterality: Right;   COLONOSCOPY  09/2003   COLONOSCOPY  07/2016   TA, diverticulosis, rpt 5 yrs Oletta)   COLONOSCOPY  11/2021   TAx1, diverticulosis, rpt 7 yrs Oletta)   dexa  04/2012   T -2.1 hip, -1.2 spine   dexa  03/2015   T -2.4 hip, -1.7 spine  EYE SURGERY  cataract   I & D EXTREMITY Right 03/16/2014   cat bite hand s/p I&D in OR ([name])   left hand surgery  1962   tendon laceration   PARTIAL HYSTERECTOMY  1980   for prolapse, ovaries remain    Family History  Problem Relation Age of Onset   Cancer Mother        leukemia   Coronary artery disease Mother 22   Coronary artery disease Father 71       MI   Hypertension Sister    Crohn's disease Sister    Parkinsonism Brother 35       deceased   Stroke Brother    CAD Brother 40       7 stents   Hypertension Brother    Stroke Brother    Diabetes Maternal Grandfather    CAD Other 56       stent   Colon cancer Neg Hx    Esophageal cancer Neg Hx    Rectal cancer Neg Hx     Stomach cancer Neg Hx     Social History   Socioeconomic History   Marital status: Widowed    Spouse name: Not on file   Number of children: 2   Years of education: 71   Highest education level: 12th grade  Occupational History   Occupation: Retired  Tobacco Use   Smoking status: Never   Smokeless tobacco: Never  Vaping Use   Vaping status: Never Used  Substance and Sexual Activity   Alcohol use: Not Currently   Drug use: No   Sexual activity: Never  Other Topics Concern   Not on file  Social History Narrative   Lives with cat    Widow, husband deceased from NHL. Brother died from PD. Sister died 12/28/2019   Daughter lives in [place]. Mother of Carlin Sayres.    Occupation: retired, prior worked in set designer    Activity: walking daily at least 30 min    Diet: good water, fruits/vegetables daily - started eating daily harvest plant based soups and smoothies    Right-handed.   No daily use of caffeine.   Social Drivers of Corporate Investment Banker Strain: Low Risk  (07/26/2023)   Overall Financial Resource Strain (CARDIA)    Difficulty of Paying Living Expenses: Not hard at all  Food Insecurity: No Food Insecurity (07/26/2023)   Hunger Vital Sign    Worried About Running Out of Food in the Last Year: Never true    Ran Out of Food in the Last Year: Never true  Transportation Needs: No Transportation Needs (07/26/2023)   PRAPARE - Administrator, Civil Service (Medical): No    Lack of Transportation (Non-Medical): No  Physical Activity: Insufficiently Active (07/26/2023)   Exercise Vital Sign    Days of Exercise per Week: 1 day    Minutes of Exercise per Session: 20 min  Stress: No Stress Concern Present (07/26/2023)   [employer] of Occupational Health - Occupational Stress Questionnaire    Feeling of Stress : Only a little  Social Connections: Socially Isolated (07/26/2023)   Social Connection and Isolation Panel [NHANES]    Frequency of  Communication with Friends and Family: Twice a week    Frequency of Social Gatherings with Friends and Family: Three times a week    Attends Religious Services: Never    Active Member of Clubs or Organizations: No    Attends Banker Meetings: Never  Marital Status: Widowed  Intimate Partner Violence: Not At Risk (06/23/2021)   Humiliation, Afraid, Rape, and Kick questionnaire    Fear of Current or Ex-Partner: No    Emotionally Abused: No    Physically Abused: No    Sexually Abused: No   Review of Systems Taste is gone No N/V Able to eat Had flu/COVID vaccines this fall    Objective:   Physical Exam Constitutional:      Appearance: Normal appearance.  HENT:     Head:     Comments: Frontal and maxillary tenderness    Right Ear: Tympanic membrane and ear canal normal.     Left Ear: Tympanic membrane and ear canal normal.     Mouth/Throat:     Pharynx: No oropharyngeal exudate or posterior oropharyngeal erythema.  Neck:     Comments: Mild tenderness in anterior cervical chain Pulmonary:     Effort: Pulmonary effort is normal.     Breath sounds: Normal breath sounds. No wheezing or rales.  Musculoskeletal:     Cervical back: Neck supple.  Neurological:     Mental Status: She is alert.            Assessment & Plan:

## 2023-07-28 ENCOUNTER — Ambulatory Visit: Payer: Self-pay | Admitting: Family Medicine

## 2023-07-28 MED ORDER — AMOXICILLIN-POT CLAVULANATE 875-125 MG PO TABS: 1.0000 | ORAL_TABLET | Freq: Two times a day (BID) | ORAL | 0 refills | Status: DC

## 2023-07-28 NOTE — Telephone Encounter (Signed)
Rx sent to [facility]

## 2023-07-28 NOTE — Telephone Encounter (Addendum)
Copied from CRM [id number]. Topic: Clinical - Red Word Triage >> Jul 28, 2023  3:05 PM Yolanda Walsh wrote: Red Word that prompted transfer to Nurse Triage:   pt was seen 01/14 Dx with flu- pts symptoms have worsened and settled in her chest. She has persistent wheezing when exhaling, shortness of breath with exertion- pt is bed bound, no energy   Chief Complaint:   Excessive  coughing  and SOB upon exertion, Expiratory Wheezing and hurt in chest when she coughs.  Decreased energy. Patient states she saw Dr L. this week. Symptoms:   No Fever 98.0 but analgesics Frequency: Coughing Continuously Pertinent Negatives: Patient denies blood Disposition: [] ED /[x] Urgent Care (no appt availability in office) / [] Appointment(In office/virtual)/ []  [facility]/ [] Home Care/ [] Refused Recommended Disposition /[] [facility]/ []  Follow-up with PCP Additional Notes:  Patient requesting provider call her in something for her coughing and expiratory wheezing.  Provider note said  to call later in the week if not better and he will start antibiotic. Please review with provider  for next steps. Patient advised to go to Urgent Care if no response in my chart. Patient states she just don't want to go anywhere but to see her doctor. Reason for Disposition  [1] MILD difficulty breathing (e.g., minimal/no SOB at rest, SOB with walking, pulse <100) AND [2] still present when not coughing  Answer Assessment - Initial Assessment Questions 1. ONSET: "When did the cough begin?"      Cough started 01/12 . Saw Dr on 07/26/23  and feels like she her ribs have been cracked  2. SEVERITY: "How bad is the cough today?"       Wheezing 3. SPUTUM: "Describe the color of your sputum" (none, dry cough; clear, white, yellow, green)      Very little come ups . 4. HEMOPTYSIS: "Are you coughing up any blood?" If so ask: "How much?" (flecks, streaks, tablespoons, etc.)     Denies 5. DIFFICULTY BREATHING: "Are you having  difficulty breathing?" If Yes, ask: "How bad is it?" (e.g., mild, moderate, severe)    -    - MODERATE: SOB at rest, SOB with minimal exertion and prefers to sit, cannot lie down flat, speaks in phrases, mild retractions, audible wheezing, pulse 100-120.  6. FEVER: "Do you have a fever?" If Yes, ask: "What is your temperature, how was it measured, and when did it start?"      Taking tylenol so unable to determine  her temp. 7. CARDIAC HISTORY: "Do you have any history of heart disease?" (e.g., heart attack, congestive heart failure)      Denies 8. LUNG HISTORY: "Do you have any history of lung disease?"  (e.g., pulmonary embolus, asthma, emphysema)     Denies 9. PE RISK FACTORS: "Do you have a history of blood clots?" (or: recent major surgery, recent prolonged travel, bedridden)     Denies 10. OTHER SYMPTOMS: "Do you have any other symptoms?" (e.g., runny nose, wheezing, chest pain)       Chest pain  from coughing , Audible Wheezing, Runny nose  is clear 11. PREGNANCY: "Is there any chance you are pregnant?" "When was your last menstrual period?"       NA 12. TRAVEL: "Have you traveled out of the country in the last month?" (e.g., travel history, exposures)       Denies  Protocols used: Cough - Acute Non-Productive-A-AH

## 2023-07-28 NOTE — Addendum Note (Signed)
Addended by: Tillman Abide I on: 07/28/2023 04:38 PM   Modules accepted: Orders

## 2023-07-28 NOTE — Telephone Encounter (Signed)
Pt notified as instructed and voiced understanding. Pt said to cancel appt with Dr Reece Agar on 07/29/23 and pt will cb if needs anything further. Appt cancelled as requested. UC & ED precautions given and pt voiced understanding.

## 2023-07-29 ENCOUNTER — Ambulatory Visit: Payer: PPO | Admitting: Family Medicine

## 2023-08-02 ENCOUNTER — Ambulatory Visit (INDEPENDENT_AMBULATORY_CARE_PROVIDER_SITE_OTHER): Payer: PPO

## 2023-08-02 DIAGNOSIS — E538 Deficiency of other specified B group vitamins: Secondary | ICD-10-CM | POA: Diagnosis not present

## 2023-08-02 MED ORDER — CYANOCOBALAMIN 1000 MCG/ML IJ SOLN
1000.0000 ug | Freq: Once | INTRAMUSCULAR | Status: AC
  Administered 2023-08-02: 1000 ug via INTRAMUSCULAR

## 2023-08-02 NOTE — Progress Notes (Signed)
Per orders of Dr. Javier Gutierrez, injection of vitamin b 12 given by Rena Isley in right deltoid. Patient tolerated injection well. Patient will make appointment for 1 month.   

## 2023-08-30 ENCOUNTER — Ambulatory Visit (INDEPENDENT_AMBULATORY_CARE_PROVIDER_SITE_OTHER): Payer: PPO

## 2023-08-30 DIAGNOSIS — E538 Deficiency of other specified B group vitamins: Secondary | ICD-10-CM | POA: Diagnosis not present

## 2023-08-30 MED ORDER — CYANOCOBALAMIN 1000 MCG/ML IJ SOLN
1000.0000 ug | Freq: Once | INTRAMUSCULAR | Status: AC
  Administered 2023-08-30: 1000 ug via INTRAMUSCULAR

## 2023-08-30 NOTE — Progress Notes (Signed)
Per orders of Dr. Javier Gutierrez, injection of vitamin b 12 given by Pricila Bridge in left deltoid. Patient tolerated injection well. Patient will make appointment for 1 month.   

## 2023-09-01 ENCOUNTER — Ambulatory Visit: Payer: PPO

## 2023-09-12 ENCOUNTER — Other Ambulatory Visit: Payer: Self-pay | Admitting: Family Medicine

## 2023-09-12 DIAGNOSIS — F411 Generalized anxiety disorder: Secondary | ICD-10-CM

## 2023-09-12 DIAGNOSIS — E785 Hyperlipidemia, unspecified: Secondary | ICD-10-CM

## 2023-10-01 ENCOUNTER — Other Ambulatory Visit: Payer: Self-pay | Admitting: Family Medicine

## 2023-10-01 DIAGNOSIS — E785 Hyperlipidemia, unspecified: Secondary | ICD-10-CM

## 2023-10-01 DIAGNOSIS — D509 Iron deficiency anemia, unspecified: Secondary | ICD-10-CM

## 2023-10-01 DIAGNOSIS — E538 Deficiency of other specified B group vitamins: Secondary | ICD-10-CM

## 2023-10-04 ENCOUNTER — Ambulatory Visit (INDEPENDENT_AMBULATORY_CARE_PROVIDER_SITE_OTHER): Payer: PPO

## 2023-10-04 ENCOUNTER — Other Ambulatory Visit (INDEPENDENT_AMBULATORY_CARE_PROVIDER_SITE_OTHER): Payer: PPO

## 2023-10-04 DIAGNOSIS — D509 Iron deficiency anemia, unspecified: Secondary | ICD-10-CM | POA: Diagnosis not present

## 2023-10-04 DIAGNOSIS — E538 Deficiency of other specified B group vitamins: Secondary | ICD-10-CM

## 2023-10-04 DIAGNOSIS — E785 Hyperlipidemia, unspecified: Secondary | ICD-10-CM | POA: Diagnosis not present

## 2023-10-04 LAB — IBC PANEL
Iron: 64 ug/dL (ref 42–145)
Saturation Ratios: 16.7 % — ABNORMAL LOW (ref 20.0–50.0)
TIBC: 383.6 ug/dL (ref 250.0–450.0)
Transferrin: 274 mg/dL (ref 212.0–360.0)

## 2023-10-04 LAB — CBC WITH DIFFERENTIAL/PLATELET
Basophils Absolute: 0 10*3/uL (ref 0.0–0.1)
Basophils Relative: 0.4 % (ref 0.0–3.0)
Eosinophils Absolute: 0.1 10*3/uL (ref 0.0–0.7)
Eosinophils Relative: 0.9 % (ref 0.0–5.0)
HCT: 37.5 % (ref 36.0–46.0)
Hemoglobin: 12.3 g/dL (ref 12.0–15.0)
Lymphocytes Relative: 31.1 % (ref 12.0–46.0)
Lymphs Abs: 2 10*3/uL (ref 0.7–4.0)
MCHC: 32.9 g/dL (ref 30.0–36.0)
MCV: 83.5 fl (ref 78.0–100.0)
Monocytes Absolute: 0.6 10*3/uL (ref 0.1–1.0)
Monocytes Relative: 9.2 % (ref 3.0–12.0)
Neutro Abs: 3.7 10*3/uL (ref 1.4–7.7)
Neutrophils Relative %: 58.4 % (ref 43.0–77.0)
Platelets: 206 10*3/uL (ref 150.0–400.0)
RBC: 4.49 Mil/uL (ref 3.87–5.11)
RDW: 14.6 % (ref 11.5–15.5)
WBC: 6.4 10*3/uL (ref 4.0–10.5)

## 2023-10-04 LAB — LDL CHOLESTEROL, DIRECT: Direct LDL: 129 mg/dL

## 2023-10-04 LAB — FERRITIN: Ferritin: 7.4 ng/mL — ABNORMAL LOW (ref 10.0–291.0)

## 2023-10-04 LAB — VITAMIN B12: Vitamin B-12: 173 pg/mL — ABNORMAL LOW (ref 211–911)

## 2023-10-04 MED ORDER — CYANOCOBALAMIN 1000 MCG/ML IJ SOLN
1000.0000 ug | Freq: Once | INTRAMUSCULAR | Status: AC
Start: 2023-10-04 — End: 2023-10-04
  Administered 2023-10-04: 1000 ug via INTRAMUSCULAR

## 2023-10-04 NOTE — Progress Notes (Signed)
Per orders of Dr. Javier Gutierrez, injection of vitamin b 12 given by Rena Isley in right deltoid. Patient tolerated injection well. Patient will make appointment for 1 month.   

## 2023-10-12 ENCOUNTER — Other Ambulatory Visit: Payer: Self-pay | Admitting: Family Medicine

## 2023-10-12 ENCOUNTER — Encounter: Payer: Self-pay | Admitting: Family Medicine

## 2023-10-12 MED ORDER — IRON (FERROUS SULFATE) 325 (65 FE) MG PO TABS: 325.0000 mg | ORAL_TABLET | Freq: Every day | ORAL | Status: DC

## 2023-10-31 ENCOUNTER — Other Ambulatory Visit: Payer: Self-pay

## 2023-11-01 ENCOUNTER — Other Ambulatory Visit: Payer: Self-pay

## 2023-11-03 ENCOUNTER — Telehealth: Payer: Self-pay

## 2023-11-03 ENCOUNTER — Other Ambulatory Visit: Payer: Self-pay

## 2023-11-03 ENCOUNTER — Ambulatory Visit: Payer: PPO

## 2023-11-03 DIAGNOSIS — E538 Deficiency of other specified B group vitamins: Secondary | ICD-10-CM

## 2023-11-03 MED ORDER — DENOSUMAB 60 MG/ML ~~LOC~~ SOSY
60.0000 mg | PREFILLED_SYRINGE | Freq: Once | SUBCUTANEOUS | Status: DC
Start: 2023-11-10 — End: 2024-05-08

## 2023-11-03 MED ORDER — CYANOCOBALAMIN 1000 MCG/ML IJ SOLN
1000.0000 ug | Freq: Once | INTRAMUSCULAR | Status: AC
  Administered 2023-11-03: 1000 ug via INTRAMUSCULAR

## 2023-11-03 NOTE — Progress Notes (Signed)
Per orders of Dr. Elsie Stain, injection of B-12 given by Francella Solian in left deltoid. Patient tolerated injection well. Patient will make appointment for 1 month.

## 2023-11-03 NOTE — Telephone Encounter (Signed)
 Prolia VOB initiated via [email or web address]  Next Prolia inj DUE: 11/24/23

## 2023-11-03 NOTE — Addendum Note (Signed)
 Addended by: Rona Cobia on: 11/03/2023 10:29 AM   Modules accepted: Orders

## 2023-11-07 DIAGNOSIS — H353132 Nonexudative age-related macular degeneration, bilateral, intermediate dry stage: Secondary | ICD-10-CM | POA: Diagnosis not present

## 2023-11-07 DIAGNOSIS — H43813 Vitreous degeneration, bilateral: Secondary | ICD-10-CM | POA: Diagnosis not present

## 2023-11-07 DIAGNOSIS — Z961 Presence of intraocular lens: Secondary | ICD-10-CM | POA: Diagnosis not present

## 2023-11-08 ENCOUNTER — Other Ambulatory Visit (HOSPITAL_COMMUNITY): Payer: Self-pay

## 2023-11-08 NOTE — Telephone Encounter (Signed)
 Pt ready for scheduling for PROLIA  on or after : 11/24/23  Option# 1: Buy/Bill (Office supplied medication)  Out-of-pocket cost due at time of clinic visit: $332  Number of injection/visits approved: ---  Primary: [employer] Prolia  co-insurance: 20% Admin fee co-insurance: 0%  Secondary: --- Prolia  co-insurance:  Admin fee co-insurance:   Medical Benefit Details: Date Benefits were checked: 11/07/23 Deductible: NO/ Coinsurance: 20%/ Admin Fee: 0%  Prior Auth: N/A PA# Expiration Date:   # of doses approved: ----------------------------------------------------------------------- Option# 2- Med Obtained from pharmacy:  Pharmacy benefit: Copay $250 (Paid to pharmacy) Admin Fee: 0% (Pay at clinic)  Prior Auth: N/A PA# Expiration Date:   # of doses approved:   If patient wants fill through the pharmacy benefit please send prescription to: [employer] [facility], and include estimated need by date in rx notes. Pharmacy will ship medication directly to the office.  Patient NOT eligible for Prolia  Copay Card. Copay Card can make patient's cost as little as $25. Link to apply: https://www.amgensupportplus.com/copay  ** This summary of benefits is an estimation of the patient's out-of-pocket cost. Exact cost may very based on individual plan coverage.

## 2023-11-09 ENCOUNTER — Other Ambulatory Visit (HOSPITAL_COMMUNITY): Payer: Self-pay

## 2023-11-09 ENCOUNTER — Other Ambulatory Visit: Payer: Self-pay

## 2023-11-09 ENCOUNTER — Other Ambulatory Visit: Payer: Self-pay | Admitting: *Deleted

## 2023-11-09 DIAGNOSIS — M810 Age-related osteoporosis without current pathological fracture: Secondary | ICD-10-CM

## 2023-11-09 MED ORDER — DENOSUMAB 60 MG/ML ~~LOC~~ SOSY
60.0000 mg | PREFILLED_SYRINGE | Freq: Once | SUBCUTANEOUS | 0 refills | Status: DC
  Filled 2023-11-09: qty 1, 1d supply, fill #0
  Filled 2023-11-16: qty 1, 180d supply, fill #0

## 2023-11-09 NOTE — Telephone Encounter (Signed)
 Patient returned call and has been made aware.

## 2023-11-09 NOTE — Telephone Encounter (Signed)
 Please see referral.

## 2023-11-16 ENCOUNTER — Other Ambulatory Visit: Payer: Self-pay

## 2023-11-16 ENCOUNTER — Other Ambulatory Visit (HOSPITAL_COMMUNITY): Payer: Self-pay

## 2023-11-16 NOTE — Progress Notes (Signed)
 Specialty Pharmacy Refill Coordination Note  Yolanda Walsh is a 73 y.o. female contacted today regarding refills of specialty medication(s) Denosumab  (PROLIA )   Patient requested Courier to Provider Office   Delivery date: 11/22/23   Verified address: [place] at [facility]   Medication will be filled on 05.12.25 when insurance will pay.

## 2023-11-17 ENCOUNTER — Encounter (HOSPITAL_COMMUNITY): Payer: Self-pay

## 2023-11-18 ENCOUNTER — Other Ambulatory Visit (INDEPENDENT_AMBULATORY_CARE_PROVIDER_SITE_OTHER)

## 2023-11-18 DIAGNOSIS — M810 Age-related osteoporosis without current pathological fracture: Secondary | ICD-10-CM

## 2023-11-18 LAB — BASIC METABOLIC PANEL WITH GFR
BUN: 20 mg/dL (ref 6–23)
CO2: 27 meq/L (ref 19–32)
Calcium: 9.4 mg/dL (ref 8.4–10.5)
Chloride: 104 meq/L (ref 96–112)
Creatinine, Ser: 0.9 mg/dL (ref 0.40–1.20)
GFR: 63.63 mL/min (ref >60.00)
Glucose, Bld: 99 mg/dL (ref 70–99)
Potassium: 3.7 meq/L (ref 3.5–5.1)
Sodium: 140 meq/L (ref 135–145)

## 2023-11-22 DIAGNOSIS — D1801 Hemangioma of skin and subcutaneous tissue: Secondary | ICD-10-CM | POA: Diagnosis not present

## 2023-11-22 DIAGNOSIS — L821 Other seborrheic keratosis: Secondary | ICD-10-CM | POA: Diagnosis not present

## 2023-11-22 DIAGNOSIS — D692 Other nonthrombocytopenic purpura: Secondary | ICD-10-CM | POA: Diagnosis not present

## 2023-11-23 ENCOUNTER — Ambulatory Visit: Payer: Self-pay | Admitting: Family Medicine

## 2023-11-24 ENCOUNTER — Ambulatory Visit

## 2023-11-24 DIAGNOSIS — M810 Age-related osteoporosis without current pathological fracture: Secondary | ICD-10-CM | POA: Diagnosis not present

## 2023-11-24 MED ORDER — DENOSUMAB 60 MG/ML ~~LOC~~ SOSY
60.0000 mg | PREFILLED_SYRINGE | Freq: Once | SUBCUTANEOUS | Status: AC
  Administered 2023-11-24: 60 mg via SUBCUTANEOUS

## 2023-11-24 MED ORDER — DENOSUMAB 60 MG/ML ~~LOC~~ SOSY: 60.0000 mg | PREFILLED_SYRINGE | Freq: Once | SUBCUTANEOUS | Status: DC

## 2023-11-24 NOTE — Progress Notes (Signed)
 Per orders of Dr. Claire Crick, who is out of office and Dr Richrd Char who is in office injection of prolia  60 mg [place] given by Claretha Crocker in right arm. Patient tolerated injection well. Patient will make appointment for 6 month.

## 2023-12-06 ENCOUNTER — Ambulatory Visit (INDEPENDENT_AMBULATORY_CARE_PROVIDER_SITE_OTHER): Payer: PPO | Admitting: *Deleted

## 2023-12-06 DIAGNOSIS — E538 Deficiency of other specified B group vitamins: Secondary | ICD-10-CM | POA: Diagnosis not present

## 2023-12-06 MED ORDER — CYANOCOBALAMIN 1000 MCG/ML IJ SOLN
1000.0000 ug | Freq: Once | INTRAMUSCULAR | Status: AC
Start: 2023-12-06 — End: 2023-12-08
  Administered 2023-12-08: 1000 ug via INTRAMUSCULAR

## 2023-12-06 NOTE — Progress Notes (Signed)
 Per orders of Dr. Claire Crick, injection of B-12 given by Georg Killian M in right deltoid. Patient tolerated injection well. Patient will make appointment for 1 month.

## 2023-12-08 DIAGNOSIS — E538 Deficiency of other specified B group vitamins: Secondary | ICD-10-CM | POA: Diagnosis not present

## 2024-01-05 ENCOUNTER — Telehealth: Payer: Self-pay | Admitting: Family Medicine

## 2024-01-05 ENCOUNTER — Ambulatory Visit: Payer: PPO

## 2024-01-05 DIAGNOSIS — E538 Deficiency of other specified B group vitamins: Secondary | ICD-10-CM

## 2024-01-05 MED ORDER — CYANOCOBALAMIN 1000 MCG/ML IJ SOLN
1000.0000 ug | Freq: Once | INTRAMUSCULAR | Status: AC
  Administered 2024-01-05: 1000 ug via INTRAMUSCULAR

## 2024-01-05 NOTE — Progress Notes (Signed)
Per orders of Dr. Elsie Stain, injection of B-12 given by Francella Solian in left deltoid. Patient tolerated injection well. Patient will make appointment for 1 month.

## 2024-01-05 NOTE — Telephone Encounter (Signed)
 During checkout, pt asked if she needed to continue b12 inj? Pt states she was told she'd only need 1 inj monthly for 6 months. Pt states the inj she received today was her 6th. Please advise. Call back # [phone]

## 2024-01-06 NOTE — Telephone Encounter (Signed)
 According to MyChart message sent to pt on 10/12/23, Dr KANDICE wants pt to continue vit B12 inj until her next physical.  Yolanda Walsh, Your iron  levels are improving but still remain low - increase oral iron  to daily as long as tolerated well.  Your vitamin B12 was improved but also remains low - continue monthly b12 shots until next physical.  Your LDL cholesterol is stable and blood counts are now normal. Let me know if any questions,  Dr Rilla  Written by Anton Rilla, MD on 10/12/2023 11:16 AM EDT Seen by patient [name] on 10/16/2023  7:32 PM  Lvm asking pt to call back. Need to relay message above.   Will also sent info to pt via MyChart.

## 2024-02-07 ENCOUNTER — Ambulatory Visit (INDEPENDENT_AMBULATORY_CARE_PROVIDER_SITE_OTHER)

## 2024-02-07 DIAGNOSIS — E538 Deficiency of other specified B group vitamins: Secondary | ICD-10-CM

## 2024-02-07 MED ORDER — CYANOCOBALAMIN 1000 MCG/ML IJ SOLN
1000.0000 ug | Freq: Once | INTRAMUSCULAR | Status: AC
Start: 2024-02-07 — End: 2024-02-07
  Administered 2024-02-07: 1000 ug via INTRAMUSCULAR

## 2024-02-07 NOTE — Progress Notes (Signed)
After obtaining consent, and per orders of Dr. Sharen Hones, injection of B-12 given IM in right deltoid by Valentino Nose. Patient tolerated injection well.

## 2024-03-08 ENCOUNTER — Ambulatory Visit

## 2024-03-08 DIAGNOSIS — E538 Deficiency of other specified B group vitamins: Secondary | ICD-10-CM

## 2024-03-08 MED ORDER — CYANOCOBALAMIN 1000 MCG/ML IJ SOLN
1000.0000 ug | Freq: Once | INTRAMUSCULAR | Status: AC
  Administered 2024-03-08: 1000 ug via INTRAMUSCULAR

## 2024-03-08 NOTE — Progress Notes (Signed)
Per orders of Dr. Rosalva Ferron is out of office and Dr Crawford Givens who is in office injection of vitamin b 12 given by Lewanda Rife in left deltoid. Patient tolerated injection well. Patient will make appointment for 1 month.

## 2024-04-02 ENCOUNTER — Other Ambulatory Visit: Payer: Self-pay

## 2024-04-02 MED ORDER — FLUZONE HIGH-DOSE 0.5 ML IM SUSY
0.5000 mL | PREFILLED_SYRINGE | Freq: Once | INTRAMUSCULAR | 0 refills | Status: AC
  Filled 2024-04-02: qty 0.5, 1d supply, fill #0

## 2024-04-05 ENCOUNTER — Other Ambulatory Visit: Payer: Self-pay

## 2024-04-05 ENCOUNTER — Other Ambulatory Visit: Payer: Self-pay | Admitting: Family Medicine

## 2024-04-05 DIAGNOSIS — H8102 Meniere's disease, left ear: Secondary | ICD-10-CM

## 2024-04-05 MED ORDER — COMIRNATY 30 MCG/0.3ML IM SUSY
0.3000 mL | PREFILLED_SYRINGE | Freq: Once | INTRAMUSCULAR | 0 refills | Status: AC
Start: 2024-04-05 — End: 2024-04-06
  Filled 2024-04-05: qty 0.3, 1d supply, fill #0

## 2024-04-05 NOTE — Telephone Encounter (Signed)
 Patient should have been due refill in June. Ok to refill as pended.

## 2024-04-10 ENCOUNTER — Ambulatory Visit

## 2024-04-10 DIAGNOSIS — E538 Deficiency of other specified B group vitamins: Secondary | ICD-10-CM | POA: Diagnosis not present

## 2024-04-10 MED ORDER — CYANOCOBALAMIN 1000 MCG/ML IJ SOLN
1000.0000 ug | Freq: Once | INTRAMUSCULAR | Status: AC
  Administered 2024-04-10: 1000 ug via INTRAMUSCULAR

## 2024-04-10 NOTE — Progress Notes (Signed)
 Per orders of Dr. Anton Blas, injection of VITAMIN B 12 given by Laray Arenas in right deltoid. Patient tolerated injection well. Patient will make appointment for 1 month.

## 2024-04-25 ENCOUNTER — Telehealth: Payer: Self-pay

## 2024-04-25 ENCOUNTER — Other Ambulatory Visit (HOSPITAL_COMMUNITY): Payer: Self-pay

## 2024-04-25 NOTE — Telephone Encounter (Signed)
 Pt ready for scheduling for PROLIA  on or after : 05/26/24  Option# 1: Buy/Bill (Office supplied medication)  Out-of-pocket cost due at time of clinic visit: $332  Number of injection/visits approved: ---  Primary: [employer] Prolia  co-insurance: 20% Admin fee co-insurance: 0%  Secondary: --- Prolia  co-insurance:  Admin fee co-insurance:   Medical Benefit Details: Date Benefits were checked: 04/25/24 Deductible: NO/ Coinsurance: 20%/ Admin Fee: 0%  Prior Auth: N/A PA# Expiration Date:   # of doses approved: ----------------------------------------------------------------------- Option# 2- Med Obtained from pharmacy: Prolia  is no longer preferred for pharmacy benefit. Jubbonti is now preferred. PRICING IS FOR JUBBONTI  Pharmacy benefit: Copay $250 (Paid to pharmacy) Admin Fee: 0% (Pay at clinic)  Prior Auth: N/A PA# Expiration Date:   # of doses approved:   If patient wants fill through the pharmacy benefit please send prescription to: [facility], and include estimated need by date in rx notes. Pharmacy will ship medication directly to the office.  Patient NOT eligible for Prolia  Copay Card. Copay Card can make patient's cost as little as $25. Link to apply: https://www.amgensupportplus.[email or web address]/copay  ** This summary of benefits is an estimation of the patient's out-of-pocket cost. Exact cost may very based on individual plan coverage.

## 2024-04-30 DIAGNOSIS — H43813 Vitreous degeneration, bilateral: Secondary | ICD-10-CM | POA: Diagnosis not present

## 2024-04-30 DIAGNOSIS — H26492 Other secondary cataract, left eye: Secondary | ICD-10-CM | POA: Diagnosis not present

## 2024-04-30 DIAGNOSIS — H353132 Nonexudative age-related macular degeneration, bilateral, intermediate dry stage: Secondary | ICD-10-CM | POA: Diagnosis not present

## 2024-04-30 DIAGNOSIS — Z961 Presence of intraocular lens: Secondary | ICD-10-CM | POA: Diagnosis not present

## 2024-05-03 ENCOUNTER — Other Ambulatory Visit: Payer: Self-pay | Admitting: Family Medicine

## 2024-05-03 ENCOUNTER — Other Ambulatory Visit: Payer: Self-pay

## 2024-05-07 ENCOUNTER — Other Ambulatory Visit: Payer: Self-pay

## 2024-05-07 ENCOUNTER — Other Ambulatory Visit (HOSPITAL_COMMUNITY): Payer: Self-pay

## 2024-05-07 MED ORDER — PROLIA 60 MG/ML ~~LOC~~ SOSY
60.0000 mg | PREFILLED_SYRINGE | Freq: Once | SUBCUTANEOUS | 0 refills | Status: AC
  Filled 2024-05-07: qty 1, 1d supply, fill #0

## 2024-05-07 NOTE — Telephone Encounter (Signed)
 ERx denosumab .  Will forward to Prolia  team as it seems preferred med was jubonti but I received refill request for Prolia .

## 2024-05-07 NOTE — Telephone Encounter (Signed)
 [facility] has a prescription on file for Prolia  for this patient- the patient's insurance now prefers the biosimilar agent Jubbonti. An updated prescription will be required to make this adjustment. No PA is required and patient's copay is $250

## 2024-05-08 ENCOUNTER — Other Ambulatory Visit: Payer: Self-pay

## 2024-05-08 ENCOUNTER — Other Ambulatory Visit (HOSPITAL_COMMUNITY): Payer: Self-pay

## 2024-05-08 MED ORDER — DENOSUMAB-BBDZ 60 MG/ML ~~LOC~~ SOSY
60.0000 mg | PREFILLED_SYRINGE | SUBCUTANEOUS | 0 refills | Status: AC
  Filled 2024-05-08: qty 1, 180d supply, fill #0

## 2024-05-08 NOTE — Progress Notes (Signed)
 Specialty Pharmacy Refill Coordination Note  Yolanda Walsh is a 73 y.o. female contacted today regarding refills of specialty medication(s) Denosumab -bbdz BARTON)   Patient requested Courier to Provider Office   Delivery date: 05/24/24   Verified address: [place] at [facility]   Medication will be filled on: 05/23/24     Patient approved $250 copay.

## 2024-05-08 NOTE — Telephone Encounter (Unsigned)
 Copied from CRM [id number]. Topic: Clinical - Prescription Issue >> May 08, 2024  9:31 AM Thersia BROCKS wrote: Reason for CRM: [facility] pharmacy called in stated prolia  isnt covered by insurance prefer the Evenity injection stated needs it today for her appointment on Thursday

## 2024-05-08 NOTE — Telephone Encounter (Signed)
 See referral

## 2024-05-08 NOTE — Telephone Encounter (Signed)
 New rx for Jubbonti has been sent to [facility] pharmacy for deliver for her appointment on 05/29/24.

## 2024-05-08 NOTE — Addendum Note (Signed)
 Addended by: ALBINO SHAVER C on: 05/08/2024 03:14 PM   Modules accepted: Orders

## 2024-05-10 ENCOUNTER — Other Ambulatory Visit

## 2024-05-10 ENCOUNTER — Ambulatory Visit

## 2024-05-10 DIAGNOSIS — E538 Deficiency of other specified B group vitamins: Secondary | ICD-10-CM | POA: Diagnosis not present

## 2024-05-10 MED ORDER — CYANOCOBALAMIN 1000 MCG/ML IJ SOLN
1000.0000 ug | Freq: Once | INTRAMUSCULAR | Status: AC
  Administered 2024-05-10: 1000 ug via INTRAMUSCULAR

## 2024-05-10 NOTE — Progress Notes (Signed)
 After obtaining consent, and per orders of  Adina Crandall, NP, injection of B-12 given IM in left deltoid by Sebastian Rosina Helling. Patient tolerated injection well.

## 2024-05-11 ENCOUNTER — Other Ambulatory Visit: Payer: Self-pay | Admitting: Family Medicine

## 2024-05-11 DIAGNOSIS — M810 Age-related osteoporosis without current pathological fracture: Secondary | ICD-10-CM

## 2024-05-11 NOTE — Progress Notes (Signed)
 Prolia  labs ordered However it seems pt has already gotten shot.

## 2024-05-17 ENCOUNTER — Encounter: Payer: Self-pay | Admitting: Family Medicine

## 2024-05-18 NOTE — Telephone Encounter (Signed)
 Spoke with pt over the phone. Her lab appointment was not completed on 05/10/24 as orignially scheduled for prolia . This has been rescheduled for 05/21/24.

## 2024-05-21 ENCOUNTER — Other Ambulatory Visit

## 2024-05-21 ENCOUNTER — Ambulatory Visit: Payer: Self-pay | Admitting: Family Medicine

## 2024-05-21 DIAGNOSIS — M810 Age-related osteoporosis without current pathological fracture: Secondary | ICD-10-CM | POA: Diagnosis not present

## 2024-05-21 LAB — BASIC METABOLIC PANEL WITH GFR
BUN: 13 mg/dL (ref 6–23)
CO2: 29 meq/L (ref 19–32)
Calcium: 9 mg/dL (ref 8.4–10.5)
Chloride: 104 meq/L (ref 96–112)
Creatinine, Ser: 0.8 mg/dL (ref 0.40–1.20)
GFR: 73.02 mL/min (ref >60.00)
Glucose, Bld: 98 mg/dL (ref 70–99)
Potassium: 4 meq/L (ref 3.5–5.1)
Sodium: 140 meq/L (ref 135–145)

## 2024-05-23 ENCOUNTER — Other Ambulatory Visit: Payer: Self-pay

## 2024-05-24 DIAGNOSIS — Z1231 Encounter for screening mammogram for malignant neoplasm of breast: Secondary | ICD-10-CM | POA: Diagnosis not present

## 2024-05-24 DIAGNOSIS — M810 Age-related osteoporosis without current pathological fracture: Secondary | ICD-10-CM | POA: Diagnosis not present

## 2024-05-24 LAB — HM DEXA SCAN

## 2024-05-24 LAB — HM MAMMOGRAPHY

## 2024-05-29 ENCOUNTER — Ambulatory Visit: Payer: Self-pay | Admitting: Family Medicine

## 2024-05-29 ENCOUNTER — Ambulatory Visit

## 2024-05-29 DIAGNOSIS — M810 Age-related osteoporosis without current pathological fracture: Secondary | ICD-10-CM

## 2024-05-29 MED ORDER — DENOSUMAB-BBDZ 60 MG/ML ~~LOC~~ SOSY: 60.0000 mg | PREFILLED_SYRINGE | SUBCUTANEOUS | Status: AC

## 2024-05-29 MED ORDER — DENOSUMAB-BBDZ 60 MG/ML ~~LOC~~ SOSY
60.0000 mg | PREFILLED_SYRINGE | Freq: Once | SUBCUTANEOUS | Status: AC
  Administered 2024-05-29: 60 mg via SUBCUTANEOUS

## 2024-05-29 NOTE — Progress Notes (Addendum)
 Per orders of Dr. Anton Blas, injection of Jubbonti  given by Danna CINDERELLA Hummer in left deltoid. Patient tolerated injection well. Patient will make appointment for 6 months.

## 2024-06-14 VITALS — Ht 64.0 in | Wt 191.0 lb

## 2024-06-14 DIAGNOSIS — Z0000 Encounter for general adult medical examination without abnormal findings: Secondary | ICD-10-CM | POA: Diagnosis not present

## 2024-06-14 NOTE — Progress Notes (Signed)
 Please attest and cosign this visit due to patients primary care provider not being in the office at the time the visit was completed.   Chief Complaint  Patient presents with   Medicare Wellness     Subjective:   Yolanda Walsh is a 73 y.o. female who presents for a Medicare Annual Wellness Visit.  Visit info / Clinical Intake: Medicare Wellness Visit Type:: Subsequent Annual Wellness Visit Persons participating in visit and providing information:: patient Medicare Wellness Visit Mode:: Telephone If telephone:: video declined Since this visit was completed virtually, some vitals may be partially provided or unavailable. Missing vitals are due to the limitations of the virtual format.: Unable to obtain vitals - no equipment If Telephone or Video please confirm:: I connected with patient using audio/video enable telemedicine. I verified patient identity with two identifiers, discussed telehealth limitations, and patient agreed to proceed. Patient Location:: home Provider Location:: clinic Interpreter Needed?: No Pre-visit prep was completed: yes AWV questionnaire completed by patient prior to visit?: yes Date:: 06/10/24 Living arrangements:: (!) lives alone Patient's Overall Health Status Rating: very good Typical amount of pain: none Does pain affect daily life?: no Are you currently prescribed opioids?: no  Dietary Habits and Nutritional Risks How many meals a day?: 3 Eats fruit and vegetables daily?: yes Most meals are obtained by: preparing own meals; eating out In the last 2 weeks, have you had any of the following?: none Diabetic:: no  Functional Status Activities of Daily Living (to include ambulation/medication): (Patient-Rptd) Independent Ambulation: (Patient-Rptd) Independent Medication Administration: Independent Home Management (perform basic housework or laundry): (Patient-Rptd) Independent Manage your own finances?: yes Primary transportation is:  driving Concerns about vision?: no *vision screening is required for WTM* Concerns about hearing?: no  Fall Screening Falls in the past year?: (Patient-Rptd) 1 Number of falls in past year: (Patient-Rptd) 0 Was there an injury with Fall?: (Patient-Rptd) 0 Fall Risk Category Calculator: (Patient-Rptd) 1 Patient Fall Risk Level: (Patient-Rptd) Low Fall Risk  Fall Risk Patient at Risk for Falls Due to: No Fall Risks Fall risk Follow up: Education provided; Falls prevention discussed  Home and Transportation Safety: All rugs have non-skid backing?: N/A, no rugs All stairs or steps have railings?: yes Grab bars in the bathtub or shower?: yes Have non-skid surface in bathtub or shower?: yes Good home lighting?: yes Regular seat belt use?: yes Hospital stays in the last year:: no  Cognitive Assessment Difficulty concentrating, remembering, or making decisions? : no Will 6CIT or Mini Cog be Completed: yes Give patient an address phrase to remember (5 components): [address] (For Healthcare) Does Patient Have a Medical Advance Directive?: Yes Type of Advance Directive: Living will; Healthcare Power of Attorney Copy of Healthcare Power of Attorney in Chart?: No - copy requested Copy of Living Will in Chart?: No - copy requested  Reviewed/Updated  Reviewed/Updated: Reviewed All (Medical, Surgical, Family, Medications, Allergies, Care Teams, Patient Goals)    Allergies (verified) Patient has no known allergies.   Current Medications (verified) Outpatient Encounter Medications as of 06/14/2024  Medication Sig   Chlorpheniramine Maleate (ALLERGY PO) Take by mouth daily.   Cholecalciferol (VITAMIN D3) 1000 UNITS CAPS Take 1 capsule (1,000 Units total) by mouth daily.   cyanocobalamin  (VITAMIN B12) 1000 MCG/ML injection Inject 1 mL (1,000 mcg total) into the muscle every 30 (thirty) days.   denosumab -bbdz (JUBBONTI ) 60 MG/ML SOSY injection  Inject 60 mg into the skin every 6 (six) months.  hydrochlorothiazide  (HYDRODIURIL ) 12.5 MG tablet TAKE 0.5 TABLETS (6.25 MG TOTAL) BY MOUTH DAILY AS NEEDED (DIZZINESS/VERTIGO).   ibuprofen (ADVIL,MOTRIN) 200 MG tablet Take 200 mg by mouth every 6 (six) hours as needed for mild pain or moderate pain.   Iron , Ferrous Sulfate , 325 (65 Fe) MG TABS Take 325 mg by mouth daily.   meclizine  (ANTIVERT ) 25 MG tablet Take 1 tablet (25 mg total) by mouth 3 (three) times daily as needed for dizziness.   Multiple Vitamins-Minerals (PRESERVISION AREDS 2+MULTI VIT PO) Take 2 capsules by mouth daily.   rosuvastatin  (CRESTOR ) 5 MG tablet TAKE ONE TABLET BY MOUTH EVERY MONDAY,WEDNESDAY, AND FRIDAY   sertraline  (ZOLOFT ) 50 MG tablet TAKE ONE TABLET BY MOUTH ONCE A DAY   amoxicillin -clavulanate (AUGMENTIN ) 875-125 MG tablet Take 1 tablet by mouth 2 (two) times daily.   Facility-Administered Encounter Medications as of 06/14/2024  Medication   denosumab -bbdz (JUBBONTI ) injection 60 mg    History: Past Medical History:  Diagnosis Date   Allergy    Ankle fracture    right   Anxiety    Cataract    Depression    History of anal fissures 2013   worsened by constipation    History of hemorrhoids    w/o complications   History of rectal fissure    Hyperlipidemia 04/24/2012   Lichen sclerosus    temovate cream   Medial meniscus tear    Osteopenia    dexa 2013, 2016   Trigger finger    Vertigo    Past Surgical History:  Procedure Laterality Date   ABDOMINAL HYSTERECTOMY     CATARACT EXTRACTION W/PHACO Left 07/04/2019   Procedure: CATARACT EXTRACTION PHACO AND INTRAOCULAR LENS PLACEMENT (IOC) LEFT  6.67  00:44.0  15.2%;  Surgeon: Mittie Gaskin, MD;  Location: [facility];  Service: Ophthalmology;  Laterality: Left;   CATARACT EXTRACTION W/PHACO Right 08/01/2019   Procedure: CATARACT EXTRACTION PHACO AND INTRAOCULAR LENS PLACEMENT (IOC) RIGHT 5.33  00:45.4  11.8%;  Surgeon: Mittie Gaskin, MD;  Location: [facility];  Service: Ophthalmology;  Laterality: Right;   COLONOSCOPY  09/2003   COLONOSCOPY  07/2016   TA, diverticulosis, rpt 5 yrs Oletta)   COLONOSCOPY  11/2021   TAx1, diverticulosis, rpt 7 yrs Oletta)   dexa  04/2012   T -2.1 hip, -1.2 spine   dexa  03/2015   T -2.4 hip, -1.7 spine   EYE SURGERY  cataract   I & D EXTREMITY Right 03/16/2014   cat bite hand s/p I&D in OR ([name])   left hand surgery  1962   tendon laceration   PARTIAL HYSTERECTOMY  1980   for prolapse, ovaries remain   Family History  Problem Relation Age of Onset   Cancer Mother        leukemia   Coronary artery disease Mother 3   Coronary artery disease Father 68       MI   Hypertension Sister    Crohn's disease Sister    Parkinsonism Brother 46       deceased   Stroke Brother    CAD Brother 40       7 stents   Hypertension Brother    Stroke Brother    Diabetes Maternal Grandfather    CAD Other 56       stent   Colon cancer Neg Hx    Esophageal cancer Neg Hx    Rectal cancer Neg Hx    Stomach cancer Neg Hx  Social History   Occupational History   Occupation: Retired  Tobacco Use   Smoking status: Never   Smokeless tobacco: Never  Vaping Use   Vaping status: Never Used  Substance and Sexual Activity   Alcohol use: Not Currently   Drug use: No   Sexual activity: Never   Tobacco Counseling Counseling given: Not Answered  SDOH Screenings   Food Insecurity: No Food Insecurity (06/10/2024)  Housing: Low Risk  (06/10/2024)  Transportation Needs: No Transportation Needs (06/10/2024)  Utilities: Not At Risk (06/14/2024)  Alcohol Screen: Low Risk  (06/14/2023)  Depression (PHQ2-9): Low Risk  (06/14/2024)  Financial Resource Strain: Low Risk  (06/10/2024)  Physical Activity: Insufficiently Active (06/10/2024)  Social Connections: Socially Isolated (06/10/2024)  Stress: No Stress Concern Present (06/10/2024)  Tobacco Use: Low Risk  (06/14/2024)  Health  Literacy: Adequate Health Literacy (06/14/2024)   See flowsheets for full screening details  Depression Screen PHQ 2 & 9 Depression Scale- Over the past 2 weeks, how often have you been bothered by any of the following problems? Little interest or pleasure in doing things: 0 Feeling down, depressed, or hopeless (PHQ Adolescent also includes...irritable): 0 PHQ-2 Total Score: 0     Goals Addressed             This Visit's Progress    I would like to wake up everyday and stay healthy as possible       COMPLETED: Increase physical activity       Starting 06/02/2018 and weather permitting, I will continue to walk 1-2 miles daily.       COMPLETED: Patient Stated       06/18/2020, I will continue to walk 1-4 miles for 3 days a week.      COMPLETED: Patient Stated       Would like to get back to walking routine     COMPLETED: Prevent Falls and Broken Bones-Osteoporosis       Timeframe:  Long-Range Goal Priority:  High Start Date:       06/17/21                      Expected End Date: 06/17/22                       Follow Up Date Dec 2023   - always use handrails on the stairs - always wear shoes or slippers with non-slip sole - get at least 10 minutes of activity every day - keep cell phone with me always -Increasing weight-baring activity (walking) to improve bone density    Why is this important?   When you fall, there are 3 things that control if a bone breaks or not.  These are the fall itself, how hard and the direction that you fall and how fragile your bones are.  Preventing falls is very important for you because of fragile bones.     Notes:              Objective:    Today's Vitals   06/14/24 1052  Weight: 191 lb (86.6 kg)  Height: 5' 4 (1.626 m)   Body mass index is 32.79 kg/m.  Hearing/Vision screen Vision Screening - Comments:: UTD w/Dr Mittie Immunizations and Health Maintenance Health Maintenance  Topic Date Due   Medicare Annual Wellness  (AWV)  06/13/2024   COVID-19 Vaccine (8 - 2025-26 season) 10/03/2024   Mammogram  05/24/2025   Bone Density Scan  05/24/2026  Colonoscopy  11/23/2028   DTaP/Tdap/Td (4 - Td or Tdap) 10/01/2030   Pneumococcal Vaccine: 50+ Years  Completed   Influenza Vaccine  Completed   Hepatitis C Screening  Completed   Zoster Vaccines- Shingrix  Completed   Meningococcal B Vaccine  Aged Out        Assessment/Plan:  This is a routine wellness examination for Yolanda Walsh.  Patient Care Team: Rilla Baller, MD as PCP - General (Family Medicine) Myrna Adine Anes, MD as Consulting Physician (Ophthalmology) Arlana Arnt, MD as Consulting Physician (Otolaryngology) Fate Morna SAILOR, [facility] (Inactive) as Pharmacist (Pharmacist)  I have personally reviewed and noted the following in the patient's chart:   Medical and social history Use of alcohol, tobacco or illicit drugs  Current medications and supplements including opioid prescriptions. Functional ability and status Nutritional status Physical activity Advanced directives List of other physicians Hospitalizations, surgeries, and ER visits in previous 12 months Vitals Screenings to include cognitive, depression, and falls Referrals and appointments  No orders of the defined types were placed in this encounter.  In addition, I have reviewed and discussed with patient certain preventive protocols, quality metrics, and best practice recommendations. A written personalized care plan for preventive services as well as general preventive health recommendations were provided to patient.   Erminio LITTIE Saris, LPN   [id number]    After Visit Summary: (MyChart) Due to this being a telephonic visit, the after visit summary with patients personalized plan was offered to patient via MyChart   Nurse Notes: Pt has no concerns or questions. CPE Lab appt/AWV/CPE made simultaneously for one year

## 2024-06-14 NOTE — Patient Instructions (Signed)
 Ms. Eustache,  Thank you for taking the time for your Medicare Wellness Visit. I appreciate your continued commitment to your health goals. Please review the care plan we discussed, and feel free to reach out if I can assist you further.  Please note that Annual Wellness Visits do not include a physical exam. Some assessments may be limited, especially if the visit was conducted virtually. If needed, we may recommend an in-person follow-up with your provider.  Ongoing Care Seeing your primary care provider every 3 to 6 months helps us  monitor your health and provide consistent, personalized care.   Referrals If a referral was made during today's visit and you haven't received any updates within two weeks, please contact the referred provider directly to check on the status.  Recommended Screenings:  Health Maintenance  Topic Date Due   Medicare Annual Wellness Visit  06/13/2024   COVID-19 Vaccine (8 - 2025-26 season) 10/03/2024   Breast Cancer Screening  05/24/2025   Osteoporosis screening with Bone Density Scan  05/24/2026   Colon Cancer Screening  11/23/2028   DTaP/Tdap/Td vaccine (4 - Td or Tdap) 10/01/2030   Pneumococcal Vaccine for age over 53  Completed   Flu Shot  Completed   Hepatitis C Screening  Completed   Zoster (Shingles) Vaccine  Completed   Meningitis B Vaccine  Aged Out       06/10/2024   12:06 PM  Advanced Directives  Does Patient Have a Medical Advance Directive? Yes  Type of Advance Directive Living will;Healthcare Power of Attorney  Copy of Healthcare Power of Attorney in Chart? No - copy requested    Vision: Annual vision screenings are recommended for early detection of glaucoma, cataracts, and diabetic retinopathy. These exams can also reveal signs of chronic conditions such as diabetes and high blood pressure.  Dental: Annual dental screenings help detect early signs of oral cancer, gum disease, and other conditions linked to overall health, including  heart disease and diabetes.

## 2024-06-21 ENCOUNTER — Other Ambulatory Visit: Payer: Self-pay | Admitting: Family Medicine

## 2024-06-21 DIAGNOSIS — E559 Vitamin D deficiency, unspecified: Secondary | ICD-10-CM

## 2024-06-21 DIAGNOSIS — D649 Anemia, unspecified: Secondary | ICD-10-CM

## 2024-06-21 DIAGNOSIS — E538 Deficiency of other specified B group vitamins: Secondary | ICD-10-CM

## 2024-06-21 DIAGNOSIS — D509 Iron deficiency anemia, unspecified: Secondary | ICD-10-CM

## 2024-06-21 DIAGNOSIS — E785 Hyperlipidemia, unspecified: Secondary | ICD-10-CM

## 2024-06-25 ENCOUNTER — Other Ambulatory Visit: Payer: PPO

## 2024-06-25 DIAGNOSIS — D509 Iron deficiency anemia, unspecified: Secondary | ICD-10-CM | POA: Diagnosis not present

## 2024-06-25 DIAGNOSIS — E559 Vitamin D deficiency, unspecified: Secondary | ICD-10-CM | POA: Diagnosis not present

## 2024-06-25 DIAGNOSIS — D649 Anemia, unspecified: Secondary | ICD-10-CM | POA: Diagnosis not present

## 2024-06-25 DIAGNOSIS — E785 Hyperlipidemia, unspecified: Secondary | ICD-10-CM | POA: Diagnosis not present

## 2024-06-25 DIAGNOSIS — E538 Deficiency of other specified B group vitamins: Secondary | ICD-10-CM | POA: Diagnosis not present

## 2024-06-25 LAB — CBC WITH DIFFERENTIAL/PLATELET
Basophils Absolute: 0 K/uL (ref 0.0–0.1)
Basophils Relative: 0.3 % (ref 0.0–3.0)
Eosinophils Absolute: 0.1 K/uL (ref 0.0–0.7)
Eosinophils Relative: 1 % (ref 0.0–5.0)
HCT: 39.9 % (ref 36.0–46.0)
Hemoglobin: 13.6 g/dL (ref 12.0–15.0)
Lymphocytes Relative: 30.7 % (ref 12.0–46.0)
Lymphs Abs: 2.3 K/uL (ref 0.7–4.0)
MCHC: 34 g/dL (ref 30.0–36.0)
MCV: 87.1 fl (ref 78.0–100.0)
Monocytes Absolute: 0.6 K/uL (ref 0.1–1.0)
Monocytes Relative: 8.4 % (ref 3.0–12.0)
Neutro Abs: 4.4 K/uL (ref 1.4–7.7)
Neutrophils Relative %: 59.6 % (ref 43.0–77.0)
Platelets: 205 K/uL (ref 150.0–400.0)
RBC: 4.59 Mil/uL (ref 3.87–5.11)
RDW: 13.5 % (ref 11.5–15.5)
WBC: 7.4 K/uL (ref 4.0–10.5)

## 2024-06-25 LAB — LIPID PANEL
Cholesterol: 189 mg/dL (ref 0–200)
HDL: 51.5 mg/dL (ref >39.00)
LDL Cholesterol: 116 mg/dL — ABNORMAL HIGH (ref 0–99)
NonHDL: 137.41
Total CHOL/HDL Ratio: 4
Triglycerides: 108 mg/dL (ref 0.0–149.0)
VLDL: 21.6 mg/dL (ref 0.0–40.0)

## 2024-06-25 LAB — COMPREHENSIVE METABOLIC PANEL WITH GFR
ALT: 11 U/L (ref 0–35)
AST: 16 U/L (ref 0–37)
Albumin: 4.3 g/dL (ref 3.5–5.2)
Alkaline Phosphatase: 61 U/L (ref 39–117)
BUN: 19 mg/dL (ref 6–23)
CO2: 28 meq/L (ref 19–32)
Calcium: 9.5 mg/dL (ref 8.4–10.5)
Chloride: 103 meq/L (ref 96–112)
Creatinine, Ser: 0.81 mg/dL (ref 0.40–1.20)
GFR: 71.9 mL/min (ref >60.00)
Glucose, Bld: 89 mg/dL (ref 70–99)
Potassium: 4 meq/L (ref 3.5–5.1)
Sodium: 139 meq/L (ref 135–145)
Total Bilirubin: 0.6 mg/dL (ref 0.2–1.2)
Total Protein: 7.1 g/dL (ref 6.0–8.3)

## 2024-06-25 LAB — VITAMIN D 25 HYDROXY (VIT D DEFICIENCY, FRACTURES): VITD: 43.93 ng/mL (ref 30.00–100.00)

## 2024-06-25 LAB — VITAMIN B12: Vitamin B-12: 234 pg/mL (ref 211–911)

## 2024-06-25 LAB — IBC PANEL
Iron: 83 ug/dL (ref 42–145)
Saturation Ratios: 23.4 % (ref 20.0–50.0)
TIBC: 354.2 ug/dL (ref 250.0–450.0)
Transferrin: 253 mg/dL (ref 212.0–360.0)

## 2024-06-25 LAB — FERRITIN: Ferritin: 16.8 ng/mL (ref 10.0–291.0)

## 2024-06-26 ENCOUNTER — Ambulatory Visit: Payer: Self-pay | Admitting: Family Medicine

## 2024-07-02 ENCOUNTER — Encounter: Payer: Self-pay | Admitting: Family Medicine

## 2024-07-02 ENCOUNTER — Ambulatory Visit (INDEPENDENT_AMBULATORY_CARE_PROVIDER_SITE_OTHER): Payer: PPO | Admitting: Family Medicine

## 2024-07-02 VITALS — BP 106/70 | HR 71 | Temp 98.1°F | Ht 64.0 in | Wt 200.0 lb

## 2024-07-02 DIAGNOSIS — H3530 Unspecified macular degeneration: Secondary | ICD-10-CM

## 2024-07-02 DIAGNOSIS — E559 Vitamin D deficiency, unspecified: Secondary | ICD-10-CM | POA: Diagnosis not present

## 2024-07-02 DIAGNOSIS — M810 Age-related osteoporosis without current pathological fracture: Secondary | ICD-10-CM | POA: Diagnosis not present

## 2024-07-02 DIAGNOSIS — E538 Deficiency of other specified B group vitamins: Secondary | ICD-10-CM | POA: Diagnosis not present

## 2024-07-02 DIAGNOSIS — Z0000 Encounter for general adult medical examination without abnormal findings: Secondary | ICD-10-CM | POA: Diagnosis not present

## 2024-07-02 DIAGNOSIS — E785 Hyperlipidemia, unspecified: Secondary | ICD-10-CM

## 2024-07-02 DIAGNOSIS — E611 Iron deficiency: Secondary | ICD-10-CM

## 2024-07-02 DIAGNOSIS — F411 Generalized anxiety disorder: Secondary | ICD-10-CM | POA: Diagnosis not present

## 2024-07-02 DIAGNOSIS — E66811 Obesity, class 1: Secondary | ICD-10-CM | POA: Diagnosis not present

## 2024-07-02 DIAGNOSIS — H8102 Meniere's disease, left ear: Secondary | ICD-10-CM

## 2024-07-02 DIAGNOSIS — Z7189 Other specified counseling: Secondary | ICD-10-CM

## 2024-07-02 MED ORDER — CYANOCOBALAMIN 1000 MCG/ML IJ SOLN
1000.0000 ug | Freq: Once | INTRAMUSCULAR | Status: AC
  Administered 2024-07-02: 1000 ug via INTRAMUSCULAR

## 2024-07-02 MED ORDER — IRON (FERROUS SULFATE) 325 (65 FE) MG PO TABS: 325.0000 mg | ORAL_TABLET | ORAL | Status: AC

## 2024-07-02 MED ORDER — SERTRALINE HCL 50 MG PO TABS: 50.0000 mg | ORAL_TABLET | Freq: Every day | ORAL | 3 refills | Status: AC

## 2024-07-02 MED ORDER — ROSUVASTATIN CALCIUM 5 MG PO TABS: 5.0000 mg | ORAL_TABLET | ORAL | 3 refills | Status: AC

## 2024-07-02 NOTE — Assessment & Plan Note (Signed)
 On Preservision, appreciate ophthalmology care.

## 2024-07-02 NOTE — Assessment & Plan Note (Signed)
 Chronic, stable period on current sertraline  50mg  daily - continue

## 2024-07-02 NOTE — Progress Notes (Signed)
 " Ph: [phone] Fax: [phone]   Patient ID: Yolanda Walsh, female    DOB: March 11, 1951, 73 y.o.   MRN: [id number]  This visit was conducted in person.  BP 106/70 (Cuff Size: Normal)   Pulse 71   Temp 98.1 F (36.7 C) (Oral)   Ht 5' 4 (1.626 m)   Wt 200 lb (90.7 kg)   SpO2 97%   BMI 34.33 kg/m    CC: CPE Subjective:   HPI: Yolanda Walsh is a 73 y.o. female presenting on 07/02/2024 for Annual Exam (Was wondering if she still needed B12 INJ or if she can go to OTC/)   Saw health advisor earlier this month for medicare wellness visit. Note reviewed.   No results found.  Flowsheet Row Office Visit from 07/02/2024 in [facility] [place]  PHQ-2 Total Score 0       07/02/2024   10:11 AM 06/10/2024   12:06 PM 06/14/2023   10:54 AM 06/28/2022    8:41 AM 06/21/2022    2:22 PM  Fall Risk   Falls in the past year? 0 1  0 0 0   Number falls in past yr: 0 0  0    Injury with Fall? 0 0   0     Risk for fall due to :  No Fall Risks No Fall Risks    Follow up Falls evaluation completed Education provided;Falls prevention discussed Education provided;Falls prevention discussed       Manually entered by patient   Data saved with a previous flowsheet row definition   No falls  this past year   Last prolia  shot 05/27/2023.  8 lb weight loss in the past year. Has not been trying to lose weight  Low vitamin b12 levels - notes significant fatigue. No significant paresthesias. Received several months of b12 shots, latest 04/2024. She has not been taking oral b12.   She takes oral iron  MWF, same as Crestor .    Meniere's disease s/p ENT eval. Trouble with full dose diazide (syncope), tolerates hctz 6.25mg  daily. No recent vertigo. Wears hearing aides.    Preventative: Colonoscopy 07/2016 - TA, diverticulosis, rpt 5 yrs Oletta).  Colonoscopy 11/2021 - TAx1, diverticulosis, rpt 7 yrs Oletta) Well woman with Dr. Estelle h/o partal hysterectomy. Sees  yearly (last seen 07/2023). Ovaries remain.  Mammogram - 05/2024 Birads1 @ [facility] DEXA 04/2012, 03/2015, 04/2017, 05/2019 T -2.4 hip, -1.8 spine  DEXA (04/2022) @ [facility] - T -2.6 LFN, -2.3 spine.  DEXA 05/2024 @ [facility]: T -2.6 at LFN.  Discussed weight bearing exercise as well as vit D replacement and dietary calcium  intake.  Prolia  started 05/18/2022, last dose 05/29/2024.  Lung cancer screening - not eligible  Flu - yearly COVID vaccine Pfizer 07/2019, 08/2019, booster 03/2020, bivalent booster 03/2021, booster 04/2022, 03/2023 Prevnar-13 2017, pneumovax 2018 - localized reaction to this.  Tdap 2012, 09/2020.  RSV - declined Zostavax - 2013 Shingrix - 09/2017, 11/2017  Advanced directive - scanned in chart 06/2019. Daughter and son are HCPOA. Does not want prolonged life support. Ok with temporary measures.  Seat belt use discussed.  Sunscreen use discussed. No changing moles on skin. Has had difficulty getting in to see dermatologist.  Non smoker  Alcohol - none  Dentist - q18mo Eye exam q89mo, on preservision MVI for ARMD - s/p cataracts 06/2019 ([place]) - just had laser treatment.  Bowel - no constipation  Bladder - no incontinence  Lives with cat  Widow, husband deceased from [place].  Daughter now local, son local Jenelle Sayres).  Occupation: retired, prior worked in set designer  Activity: no regular exercise  Diet: good water, fruits/vegetables daily, good calcium  in diet     Relevant past medical, surgical, family and social history reviewed and updated as indicated. Interim medical history since our last visit reviewed. Allergies and medications reviewed and updated. Outpatient Medications Prior to Visit  Medication Sig Dispense Refill   Chlorpheniramine Maleate (ALLERGY PO) Take by mouth daily.     Cholecalciferol (VITAMIN D3) 1000 UNITS CAPS Take 1 capsule (1,000 Units total) by mouth daily. 30 capsule    clobetasol cream (TEMOVATE) 0.05 % Apply 1 Application topically as  needed.     cyanocobalamin  (VITAMIN B12) 1000 MCG/ML injection Inject 1 mL (1,000 mcg total) into the muscle every 30 (thirty) days.     denosumab -bbdz (JUBBONTI ) 60 MG/ML SOSY injection Inject 60 mg into the skin every 6 (six) months. 1 mL 0   hydrochlorothiazide  (HYDRODIURIL ) 12.5 MG tablet TAKE 0.5 TABLETS (6.25 MG TOTAL) BY MOUTH DAILY AS NEEDED (DIZZINESS/VERTIGO). 30 tablet 3   ibuprofen (ADVIL,MOTRIN) 200 MG tablet Take 200 mg by mouth every 6 (six) hours as needed for mild pain or moderate pain.     meclizine  (ANTIVERT ) 25 MG tablet Take 1 tablet (25 mg total) by mouth 3 (three) times daily as needed for dizziness. 30 tablet 0   Multiple Vitamins-Minerals (PRESERVISION AREDS 2+MULTI VIT PO) Take 2 capsules by mouth daily.     Iron , Ferrous Sulfate , 325 (65 Fe) MG TABS Take 325 mg by mouth daily.     rosuvastatin  (CRESTOR ) 5 MG tablet TAKE ONE TABLET BY MOUTH EVERY MONDAY,WEDNESDAY, AND FRIDAY 40 tablet 3   sertraline  (ZOLOFT ) 50 MG tablet TAKE ONE TABLET BY MOUTH ONCE A DAY 90 tablet 3   amoxicillin -clavulanate (AUGMENTIN ) 875-125 MG tablet Take 1 tablet by mouth 2 (two) times daily. 14 tablet 0   Facility-Administered Medications Prior to Visit  Medication Dose Route Frequency Provider Last Rate Last Admin   denosumab -bbdz (JUBBONTI ) injection 60 mg  60 mg Subcutaneous Q6 months Rilla Baller, MD         Per HPI unless specifically indicated in ROS section below Review of Systems  Constitutional:  Negative for activity change, appetite change, chills, fatigue, fever and unexpected weight change.  HENT:  Positive for postnasal drip. Negative for congestion and hearing loss.   Eyes:  Negative for visual disturbance.  Respiratory:  Negative for cough, chest tightness, shortness of breath and wheezing.   Cardiovascular:  Negative for chest pain, palpitations and leg swelling.  Gastrointestinal:  Negative for abdominal distention, abdominal pain, blood in stool, constipation,  diarrhea, nausea and vomiting.  Genitourinary:  Negative for difficulty urinating and hematuria.  Musculoskeletal:  Negative for arthralgias, myalgias and neck pain.  Skin:  Negative for rash.  Neurological:  Negative for dizziness, seizures, syncope and headaches.  Hematological:  Negative for adenopathy. Does not bruise/bleed easily.  Psychiatric/Behavioral:  Negative for dysphoric mood. The patient is not nervous/anxious.     Objective:  BP 106/70 (Cuff Size: Normal)   Pulse 71   Temp 98.1 F (36.7 C) (Oral)   Ht 5' 4 (1.626 m)   Wt 200 lb (90.7 kg)   SpO2 97%   BMI 34.33 kg/m   Wt Readings from Last 3 Encounters:  07/02/24 200 lb (90.7 kg)  06/14/24 191 lb (86.6 kg)  07/26/23 191 lb (86.6 kg)  Physical Exam Vitals and nursing note reviewed.  Constitutional:      Appearance: Normal appearance. She is not ill-appearing.  HENT:     Head: Normocephalic and atraumatic.     Right Ear: Tympanic membrane, ear canal and external ear normal. There is no impacted cerumen.     Left Ear: Tympanic membrane, ear canal and external ear normal. There is no impacted cerumen.     Mouth/Throat:     Mouth: Mucous membranes are moist.     Pharynx: Oropharynx is clear. No oropharyngeal exudate or posterior oropharyngeal erythema.  Eyes:     General:        Right eye: No discharge.        Left eye: No discharge.     Extraocular Movements: Extraocular movements intact.     Conjunctiva/sclera: Conjunctivae normal.     Pupils: Pupils are equal, round, and reactive to light.  Neck:     Thyroid : No thyroid  mass or thyromegaly.     Vascular: No carotid bruit.  Cardiovascular:     Rate and Rhythm: Normal rate and regular rhythm.     Pulses: Normal pulses.     Heart sounds: Normal heart sounds. No murmur heard. Pulmonary:     Effort: Pulmonary effort is normal. No respiratory distress.     Breath sounds: Normal breath sounds. No wheezing, rhonchi or rales.  Abdominal:     General:  Bowel sounds are normal. There is no distension.     Palpations: Abdomen is soft. There is no mass.     Tenderness: There is no abdominal tenderness. There is no guarding or rebound.     Hernia: No hernia is present.  Musculoskeletal:     Cervical back: Normal range of motion and neck supple. No rigidity.     Right lower leg: No edema.     Left lower leg: No edema.  Lymphadenopathy:     Cervical: No cervical adenopathy.  Skin:    General: Skin is warm and dry.     Findings: No rash.  Neurological:     General: No focal deficit present.     Mental Status: She is alert. Mental status is at baseline.  Psychiatric:        Mood and Affect: Mood normal.        Behavior: Behavior normal.       Results for orders placed or performed in visit on 06/25/24  VITAMIN D  25 Hydroxy (Vit-D Deficiency, Fractures)   Collection Time: 06/25/24  7:55 AM  Result Value Ref Range   VITD 43.93 30.00 - 100.00 ng/mL  IBC panel   Collection Time: 06/25/24  7:55 AM  Result Value Ref Range   Iron  83 42 - 145 ug/dL   Transferrin 746.9 787.9 - 360.0 mg/dL   Saturation Ratios 76.5 20.0 - 50.0 %   TIBC 354.2 250.0 - 450.0 mcg/dL  Ferritin   Collection Time: 06/25/24  7:55 AM  Result Value Ref Range   Ferritin 16.8 10.0 - 291.0 ng/mL  Vitamin B12   Collection Time: 06/25/24  7:55 AM  Result Value Ref Range   Vitamin B-12 234 211 - 911 pg/mL  CBC with Differential/Platelet   Collection Time: 06/25/24  7:55 AM  Result Value Ref Range   WBC 7.4 4.0 - 10.5 K/uL   RBC 4.59 3.87 - 5.11 Mil/uL   Hemoglobin 13.6 12.0 - 15.0 g/dL   HCT 60.0 63.9 - 53.9 %   MCV 87.1 78.0 - 100.0 fl  MCHC 34.0 30.0 - 36.0 g/dL   RDW 86.4 88.4 - 84.4 %   Platelets 205.0 150.0 - 400.0 K/uL   Neutrophils Relative % 59.6 43.0 - 77.0 %   Lymphocytes Relative 30.7 12.0 - 46.0 %   Monocytes Relative 8.4 3.0 - 12.0 %   Eosinophils Relative 1.0 0.0 - 5.0 %   Basophils Relative 0.3 0.0 - 3.0 %   Neutro Abs 4.4 1.4 - 7.7 K/uL    Lymphs Abs 2.3 0.7 - 4.0 K/uL   Monocytes Absolute 0.6 0.1 - 1.0 K/uL   Eosinophils Absolute 0.1 0.0 - 0.7 K/uL   Basophils Absolute 0.0 0.0 - 0.1 K/uL  Comprehensive metabolic panel with GFR   Collection Time: 06/25/24  7:55 AM  Result Value Ref Range   Sodium 139 135 - 145 mEq/L   Potassium 4.0 3.5 - 5.1 mEq/L   Chloride 103 96 - 112 mEq/L   CO2 28 19 - 32 mEq/L   Glucose, Bld 89 70 - 99 mg/dL   BUN 19 6 - 23 mg/dL   Creatinine, Ser 9.18 0.40 - 1.20 mg/dL   Total Bilirubin 0.6 0.2 - 1.2 mg/dL   Alkaline Phosphatase 61 39 - 117 U/L   AST 16 0 - 37 U/L   ALT 11 0 - 35 U/L   Total Protein 7.1 6.0 - 8.3 g/dL   Albumin 4.3 3.5 - 5.2 g/dL   GFR 28.09 >39.99 mL/min   Calcium  9.5 8.4 - 10.5 mg/dL  Lipid panel   Collection Time: 06/25/24  7:55 AM  Result Value Ref Range   Cholesterol 189 0 - 200 mg/dL   Triglycerides 891.9 0.0 - 149.0 mg/dL   HDL 48.49 >60.99 mg/dL   VLDL 78.3 0.0 - 59.9 mg/dL   LDL Cholesterol 883 (H) 0 - 99 mg/dL   Total CHOL/HDL Ratio 4    NonHDL 137.41     Assessment & Plan:   Problem List Items Addressed This Visit     Healthcare maintenance - Primary (Chronic)   Preventative protocols reviewed and updated unless pt declined. Discussed healthy diet and lifestyle.       Advanced care planning/counseling discussion (Chronic)   Previously discussed      Osteoporosis   Reviewed latest DEXA - will continue denosumab  Q6 mo.       GAD (generalized anxiety disorder)   Chronic, stable period on current sertraline  50mg  daily - continue       Relevant Medications   sertraline  (ZOLOFT ) 50 MG tablet   Hyperlipidemia   Chronic, LDL above goal on crestor  5mg  MWF - reviewed diet choices to improve cholesterol control. The 10-year ASCVD risk score (Arnett DK, et al., 2019) is: 12%   Values used to calculate the score:     Age: 32 years     Clinically relevant sex: Female     Is Non-Hispanic African American: No     Diabetic: No     Tobacco smoker: No      Systolic Blood Pressure: 106 mmHg     Is BP treated: Yes     HDL Cholesterol: 51.5 mg/dL     Total Cholesterol: 189 mg/dL       Relevant Medications   rosuvastatin  (CRESTOR ) 5 MG tablet   Obesity, Class I, BMI 30-34.9   Discussed weight gain noted, continue to encourage healthy diet and lifestyle choices to affect sustainable weight loss.       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       Vitamin D  deficiency   Continue oral vit D 1000 units daily OTC      Meniere disease, left   Appreciate ENT care.       Vitamin B12 deficiency   S/p monthly b12 shots, last 04/2024  Levels improving but remain low - restart monthly shots x6 months then start oral b12 replacement until next labs.  Check IF Ab levels next labs.       Relevant Medications   cyanocobalamin  (VITAMIN B12) injection 1,000 mcg   Iron  deficiency   Stable period on oral iron  MWF with resolution of anemia. UTD colonoscopy. No vaginal bleeding.  UA normal on last check 06/2023        Meds ordered this encounter  Medications   rosuvastatin  (CRESTOR ) 5 MG tablet    Sig: Take 1 tablet (5 mg total) by mouth every Monday, Wednesday, and Friday.    Dispense:  40 tablet    Refill:  3   sertraline  (ZOLOFT ) 50 MG tablet    Sig: Take 1 tablet (50 mg total) by mouth daily.    Dispense:  90 tablet    Refill:  3   Iron , Ferrous Sulfate , 325 (65 Fe) MG TABS    Sig: Take 325 mg by mouth every Monday, Wednesday, and Friday.   cyanocobalamin  (VITAMIN B12) injection 1,000 mcg    No orders of the defined types were placed in this encounter.   Patient Instructions  Restart monthly vitamin B12 shots for 6 months - first one today. After completing 6 months, start vitamin b12 by mouth over the counter 1000mcg daily.  Work on diet and walking for goal weight loss.  Good to see you today Return as needed or in 1 year for next physical   Follow up  plan: Return in about 1 year (around 07/02/2025) for annual exam, prior fasting for blood work, medicare wellness visit.  Anton Blas, MD   "

## 2024-07-02 NOTE — Assessment & Plan Note (Signed)
 Reviewed latest DEXA - will continue denosumab  Q6 mo.

## 2024-07-02 NOTE — Assessment & Plan Note (Signed)
 Previously discussed.

## 2024-07-02 NOTE — Assessment & Plan Note (Addendum)
 S/p monthly b12 shots, last 04/2024  Levels improving but remain low - restart monthly shots x6 months then start oral b12 replacement until next labs.  Check IF Ab levels next labs.

## 2024-07-02 NOTE — Assessment & Plan Note (Addendum)
 Discussed weight gain noted, continue to encourage healthy diet and lifestyle choices to affect sustainable weight loss.

## 2024-07-02 NOTE — Assessment & Plan Note (Signed)
 Preventative protocols reviewed and updated unless pt declined. Discussed healthy diet and lifestyle.

## 2024-07-02 NOTE — Assessment & Plan Note (Signed)
 Continue oral vit D 1000 units daily OTC

## 2024-07-02 NOTE — Assessment & Plan Note (Addendum)
 Stable period on oral iron  MWF with resolution of anemia. UTD colonoscopy. No vaginal bleeding.  UA normal on last check 06/2023

## 2024-07-02 NOTE — Patient Instructions (Addendum)
 Restart monthly vitamin B12 shots for 6 months - first one today. After completing 6 months, start vitamin b12 by mouth over the counter 1000mcg daily.  Work on diet and walking for goal weight loss.  Good to see you today Return as needed or in 1 year for next physical

## 2024-07-02 NOTE — Assessment & Plan Note (Signed)
 Chronic, LDL above goal on crestor  5mg  MWF - reviewed diet choices to improve cholesterol control. The 10-year ASCVD risk score (Arnett DK, et al., 2019) is: 12%   Values used to calculate the score:     Age: 73 years     Clinically relevant sex: Female     Is Non-Hispanic African American: No     Diabetic: No     Tobacco smoker: No     Systolic Blood Pressure: 106 mmHg     Is BP treated: Yes     HDL Cholesterol: 51.5 mg/dL     Total Cholesterol: 189 mg/dL

## 2024-07-02 NOTE — Assessment & Plan Note (Signed)
Appreciate ENT care.

## 2024-08-02 ENCOUNTER — Ambulatory Visit (INDEPENDENT_AMBULATORY_CARE_PROVIDER_SITE_OTHER)

## 2024-08-02 DIAGNOSIS — E538 Deficiency of other specified B group vitamins: Secondary | ICD-10-CM

## 2024-08-02 MED ORDER — CYANOCOBALAMIN 1000 MCG/ML IJ SOLN
1000.0000 ug | Freq: Once | INTRAMUSCULAR | Status: AC
  Administered 2024-08-02: 1000 ug via INTRAMUSCULAR

## 2024-08-02 NOTE — Progress Notes (Signed)
 Per orders ofDr Eustaquio Boyden who is out of office and Dr. Loleta Books is in office injection of vitamin b 12 given by Lewanda Rife in left deltoid. Patient tolerated injection well. Patient will make appointment for 1 month.

## 2024-08-07 ENCOUNTER — Ambulatory Visit

## 2024-09-04 ENCOUNTER — Ambulatory Visit

## 2024-10-02 ENCOUNTER — Ambulatory Visit

## 2024-11-01 ENCOUNTER — Ambulatory Visit

## 2024-11-29 ENCOUNTER — Ambulatory Visit

## 2025-01-01 ENCOUNTER — Ambulatory Visit

## 2025-01-31 ENCOUNTER — Ambulatory Visit

## 2025-07-01 ENCOUNTER — Other Ambulatory Visit

## 2025-07-08 ENCOUNTER — Encounter: Admitting: Family Medicine

## 2025-07-08 ENCOUNTER — Ambulatory Visit
